# Patient Record
Sex: Female | Born: 1950 | Race: White | Hispanic: No | Marital: Single | Smoking: Current some day smoker
Health system: Southern US, Community
[De-identification: ages and names within clinical notes are randomized; demographics above are authoritative.]

## PROBLEM LIST (undated history)

## (undated) ENCOUNTER — Emergency Department (HOSPITAL_COMMUNITY): Payer: Medicare Other | Source: Home / Self Care

## (undated) DIAGNOSIS — F32A Depression, unspecified: Secondary | ICD-10-CM

## (undated) DIAGNOSIS — F329 Major depressive disorder, single episode, unspecified: Secondary | ICD-10-CM

## (undated) DIAGNOSIS — R1310 Dysphagia, unspecified: Secondary | ICD-10-CM

## (undated) DIAGNOSIS — F259 Schizoaffective disorder, unspecified: Secondary | ICD-10-CM

## (undated) DIAGNOSIS — G4700 Insomnia, unspecified: Secondary | ICD-10-CM

## (undated) DIAGNOSIS — I729 Aneurysm of unspecified site: Secondary | ICD-10-CM

## (undated) DIAGNOSIS — H539 Unspecified visual disturbance: Secondary | ICD-10-CM

## (undated) HISTORY — PX: FOOT SURGERY: SHX648

---

## 2019-09-30 ENCOUNTER — Encounter (HOSPITAL_COMMUNITY): Payer: Self-pay

## 2019-09-30 ENCOUNTER — Emergency Department (HOSPITAL_COMMUNITY): Payer: Medicare Other

## 2019-09-30 ENCOUNTER — Emergency Department (HOSPITAL_COMMUNITY)
Admission: EM | Admit: 2019-09-30 | Discharge: 2019-09-30 | Disposition: A | Discharge status: Home / Self Care | Payer: Medicare Other | Source: Home / Self Care | Attending: Emergency Medicine | Admitting: Emergency Medicine

## 2019-09-30 ENCOUNTER — Other Ambulatory Visit: Payer: Self-pay

## 2019-09-30 ENCOUNTER — Emergency Department (HOSPITAL_COMMUNITY)
Admission: EM | Admit: 2019-09-30 | Discharge: 2019-09-30 | Disposition: A | Discharge status: Home / Self Care | Payer: Medicare Other | Attending: Emergency Medicine | Admitting: Emergency Medicine

## 2019-09-30 DIAGNOSIS — R531 Weakness: Secondary | ICD-10-CM

## 2019-09-30 DIAGNOSIS — R4789 Other speech disturbances: Secondary | ICD-10-CM | POA: Insufficient documentation

## 2019-09-30 DIAGNOSIS — Z5900 Homelessness unspecified: Secondary | ICD-10-CM

## 2019-09-30 DIAGNOSIS — H538 Other visual disturbances: Secondary | ICD-10-CM

## 2019-09-30 DIAGNOSIS — Z79899 Other long term (current) drug therapy: Secondary | ICD-10-CM | POA: Insufficient documentation

## 2019-09-30 DIAGNOSIS — I10 Essential (primary) hypertension: Secondary | ICD-10-CM

## 2019-09-30 HISTORY — DX: Schizoaffective disorder, unspecified: F25.9

## 2019-09-30 HISTORY — DX: Depression, unspecified: F32.A

## 2019-09-30 LAB — URINALYSIS, ROUTINE W REFLEX MICROSCOPIC
Bilirubin Urine: NEGATIVE
Glucose, UA: NEGATIVE mg/dL
Hgb urine dipstick: NEGATIVE
Ketones, ur: NEGATIVE mg/dL
Nitrite: NEGATIVE
Protein, ur: NEGATIVE mg/dL
Specific Gravity, Urine: 1.01 (ref 1.005–1.030)
pH: 8 (ref 5.0–8.0)

## 2019-09-30 LAB — CBC WITH DIFFERENTIAL/PLATELET
Abs Immature Granulocytes: 0.04 10*3/uL (ref 0.00–0.07)
Basophils Absolute: 0.1 10*3/uL (ref 0.0–0.1)
Basophils Relative: 1 %
Eosinophils Absolute: 0.2 10*3/uL (ref 0.0–0.5)
Eosinophils Relative: 2 %
HCT: 43.8 % (ref 36.0–46.0)
Hemoglobin: 14.3 g/dL (ref 12.0–15.0)
Immature Granulocytes: 0 %
Lymphocytes Relative: 18 %
Lymphs Abs: 1.6 10*3/uL (ref 0.7–4.0)
MCH: 31.7 pg (ref 26.0–34.0)
MCHC: 32.6 g/dL (ref 30.0–36.0)
MCV: 97.1 fL (ref 80.0–100.0)
Monocytes Absolute: 1.2 10*3/uL — ABNORMAL HIGH (ref 0.1–1.0)
Monocytes Relative: 14 %
Neutro Abs: 5.8 10*3/uL (ref 1.7–7.7)
Neutrophils Relative %: 65 %
Platelets: 405 10*3/uL — ABNORMAL HIGH (ref 150–400)
RBC: 4.51 MIL/uL (ref 3.87–5.11)
RDW: 13.3 % (ref 11.5–15.5)
WBC: 8.9 10*3/uL (ref 4.0–10.5)
nRBC: 0 % (ref 0.0–0.2)

## 2019-09-30 LAB — RAPID URINE DRUG SCREEN, HOSP PERFORMED
Amphetamines: NOT DETECTED
Barbiturates: NOT DETECTED
Benzodiazepines: NOT DETECTED
Cocaine: NOT DETECTED
Opiates: NOT DETECTED
Tetrahydrocannabinol: NOT DETECTED

## 2019-09-30 LAB — COMPREHENSIVE METABOLIC PANEL
ALT: 16 U/L (ref 0–44)
AST: 29 U/L (ref 15–41)
Albumin: 4.3 g/dL (ref 3.5–5.0)
Alkaline Phosphatase: 76 U/L (ref 38–126)
Anion gap: 11 (ref 5–15)
BUN: 12 mg/dL (ref 8–23)
CO2: 28 mmol/L (ref 22–32)
Calcium: 9.4 mg/dL (ref 8.9–10.3)
Chloride: 95 mmol/L — ABNORMAL LOW (ref 98–111)
Creatinine, Ser: 0.36 mg/dL — ABNORMAL LOW (ref 0.44–1.00)
GFR calc Af Amer: >60 mL/min (ref >60)
GFR calc non Af Amer: >60 mL/min (ref >60)
Glucose, Bld: 110 mg/dL — ABNORMAL HIGH (ref 70–99)
Potassium: 3.8 mmol/L (ref 3.5–5.1)
Sodium: 134 mmol/L — ABNORMAL LOW (ref 135–145)
Total Bilirubin: 1.2 mg/dL (ref 0.3–1.2)
Total Protein: 7.3 g/dL (ref 6.5–8.1)

## 2019-09-30 LAB — ACETAMINOPHEN LEVEL: Acetaminophen (Tylenol), Serum: <10 ug/mL — ABNORMAL LOW (ref 10–30)

## 2019-09-30 LAB — SALICYLATE LEVEL: Salicylate Lvl: <7 mg/dL — ABNORMAL LOW (ref 7.0–30.0)

## 2019-09-30 LAB — URINALYSIS, MICROSCOPIC (REFLEX)

## 2019-09-30 LAB — CBG MONITORING, ED: Glucose-Capillary: 115 mg/dL — ABNORMAL HIGH (ref 70–99)

## 2019-09-30 MED ORDER — BISACODYL 5 MG PO TBEC: 5.0000 mg | DELAYED_RELEASE_TABLET | Freq: Once | ORAL | Status: DC
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
## 2019-09-30 NOTE — ED Notes (Signed)
Pt given orange juice.

## 2019-09-30 NOTE — ED Provider Notes (Signed)
TIME SEEN: 4:54 AM  CHIEF COMPLAINT: Blurry vision, speech changes  HPI: Patient is a 68 year old female with history of schizoaffective disorder who presents to the emergency department with complaints of blurry vision and changes in her speech that started after drinking a Sprite from [employer] tonight.  Reports that she just took a cab from [place] here to [place] and the police brought her from [employer] here to the hospital.  She states she feels her blurry vision and speech changes have improved.  She describes her speech changes as slurred speech.  No headache, head injury, chest pain or shortness of breath, fevers, cough, nausea, vomiting or diarrhea.  No numbness or focal weakness.  No vision loss.  She denies any eye pain.  No double vision.  She is not on blood thinners.  Reports she has had a previous aneurysm repaired but no history of intracranial hemorrhage. States she is currently homeless.  ROS: See HPI Constitutional: no fever  Eyes: no drainage  ENT: no runny nose   Cardiovascular:  no chest pain  Resp: no SOB  GI: no vomiting GU: no dysuria Integumentary: no rash  Allergy: no hives  Musculoskeletal: no leg swelling  Neurological: no slurred speech ROS otherwise negative  PAST MEDICAL HISTORY/PAST SURGICAL HISTORY:  Past Medical History:  Diagnosis Date  . Depression   . Schizoaffective disorder (HCC)     MEDICATIONS:  Prior to Admission medications   Not on File    ALLERGIES:  Allergies  Allergen Reactions  . Penicillins     SOCIAL HISTORY:  Social History   Tobacco Use  . Smoking status: Not on file  Substance Use Topics  . Alcohol use: Not on file    FAMILY HISTORY: No family history on file.  EXAM: BP (!) 169/110   Pulse 82   Temp 97.7 F (36.5 C)   Resp 18   SpO2 97%  CONSTITUTIONAL: Alert and oriented and responds appropriately to questions. 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, difficult to test visual fields due to patient's poor cooperation, no hyphema or hypopyon, no chemosis, no signs of conjunctivitis, no tearing or drainage, no sign of foreign body ENT: normal nose; moist mucous membranes,  NECK: Supple, normal ROM CARD: RRR; S1 and S2 appreciated; no murmurs, no clicks, no rubs, no gallops RESP: Normal chest excursion without splinting or tachypnea; breath sounds clear and equal bilaterally; no wheezes, no rhonchi, no rales, no hypoxia or respiratory distress, speaking full sentences ABD/GI: Normal bowel sounds; non-distended; soft, non-tender, no rebound, no guarding, no peritoneal signs, no hepatosplenomegaly BACK:  The back appears normal EXT: Normal ROM in all joints; no deformity noted, no edema; no cyanosis SKIN: Normal color for age and race; warm; no rash on exposed skin NEURO: Moves all extremities equally, sensation to light touch intact diffusely, cranial nerves II through XII intact other than diminished extraocular movements in the right eye which is chronic, speech is normal without aphasia or dysarthria, normal gait PSYCH: Bizarre affect but no SI, HI or hallucinations.  Does not appear to be responding to internal stimuli.  MEDICAL DECISION MAKING: Patient here with complaints of blurry vision and speech changes after drinking a Sprite from [employer] tonight.  I suspect that there is some psychogenic component today and possible malingering for secondary gain due to homelessness.  She does however have a history of ?  Intracranial aneurysm repair.  No focal neurologic deficits on my exam currently when she states her symptoms are improving.  Will obtain labs, urine and a CT of her head.  She has no eye pain, vision loss, double vision.  ED PROGRESS: Labs  reviewed/interpreted and are unremarkable.  Head CT reviewed/interpreted and shows no acute intracranial findings.  She does have metallic coil in the vicinity of the communicating right internal carotid.  No intracranial hemorrhage.  Urine pending.  If patient continues to be neurologically intact, anticipate discharge with outpatient resources.  On reevaluation, patient reports her neurologic issues are improving but she is now feeling lightheaded.  She is eating and drinking in the room without difficulty and smiling and laughing.  She does not appear to be in distress.  On the mention of discharge, patient immediately asked for social work consult.  She states she needs help with transportation, housing, PCP follow-up.  Will place social work consultation but anticipate patient can be discharged afterwards.   At this time, I do not feel there is any life-threatening condition present. I have reviewed, interpreted and discussed all results (EKG, imaging, lab, urine as appropriate) and exam findings with patient/family. I have reviewed nursing notes and appropriate previous records.  I feel the patient is safe to be discharged home without further emergent workup and can continue workup as an outpatient as needed. Discussed usual and customary return precautions. Patient/family verbalize understanding and are comfortable with this plan.  Outpatient follow-up has been provided as needed. All questions have been answered.    EKG Interpretation  Date/Time:  Thursday September 30 2019 05:13:13 EDT Ventricular Rate:  80 PR Interval:    QRS Duration: 100 QT Interval:  385 QTC Calculation: 445 R Axis:   -40 Text Interpretation: Sinus rhythm Atrial premature complex Probable left atrial enlargement Left anterior fascicular block Left ventricular hypertrophy Anterior Q waves, possibly due to LVH No old tracing to compare Confirmed by Breshae Belcher, Cyril Mourning [phone]) on 09/30/2019 5:50:18 AM         Angela Medina  was evaluated in Emergency Department on 09/30/2019 for the symptoms described in the history of present illness. She was evaluated in the context of the global COVID-19 pandemic, which necessitated consideration that the patient might be at risk for infection with the SARS-CoV-2 virus that causes COVID-19. Institutional protocols and algorithms that pertain to the evaluation of patients at risk for COVID-19 are in a state of rapid change based on information released by regulatory bodies including the CDC and federal and state organizations. These policies and algorithms were followed during the patient's care in the ED.      Chardonay Scritchfield, Delice Bison, DO 09/30/19 [phone]
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
## 2019-09-30 NOTE — ED Provider Notes (Signed)
[facility] EMERGENCY DEPARTMENT Provider Note   CSN: [id number] Arrival date & time: 09/30/19  1152     History Chief Complaint  Patient presents with  . recheck weakness    Angela Medina is a 69 y.o. female.  HPI   This patient is a 69 year old female who presents to the hospital approximately 5 hours after last being seen at another hospital within the system, evidently the patient had recently traveled from [place] to here, she has schizoaffective disorder, she thinks that she drank something in her Sprite that made her feel weird, she denies suicidal thoughts or active hallucinations but feels anxious like she needs to have someone help her, she is unable to tell me if she has a place to stay, if she has family in the area, she does have some flights of ideas, however she wants to have her blood rechecked to make sure that she is not having anything wrong with her blood.  She denies having any specific complaints other than generalized weakness and a poor appetite.  I have reviewed the medical record from earlier in the day, the patient had been seen for this mild generalized weakness and her labs were unremarkable at that time including metabolic panel, CBC and a urinalysis.  The patient denies chest pain, she is concerned because she has a lump in her epigastrium.  Past Medical History:  Diagnosis Date  . Depression   . Schizoaffective disorder (HCC)     There are no problems to display for this patient.   History reviewed. No pertinent surgical history.   OB History   No obstetric history on file.     No family history on file.  Social History   Tobacco Use  . Smoking status: Not on file  Substance Use Topics  . Alcohol use: Not on file  . Drug use: Not on file    Home Medications Prior to Admission medications   Not on File    Allergies    Penicillins  Review of Systems   Review of Systems  All other systems reviewed and are  negative.   Physical Exam Updated Vital Signs BP (!) 184/106 (BP Location: Left Arm)   Pulse 100   Temp 98.3 F (36.8 C) (Oral)   Resp 20   SpO2 94%   Physical Exam Vitals and nursing note reviewed.  Constitutional:      General: She is not in acute distress.    Appearance: She is well-developed.  HENT:     Head: Normocephalic and atraumatic.     Mouth/Throat:     Pharynx: No oropharyngeal exudate.  Eyes:     General: No scleral icterus.       Right eye: No discharge.        Left eye: No discharge.     Conjunctiva/sclera: Conjunctivae normal.     Pupils: Pupils are equal, round, and reactive to light.  Neck:     Thyroid: No thyromegaly.     Vascular: No JVD.  Cardiovascular:     Rate and Rhythm: Normal rate and regular rhythm.     Heart sounds: Normal heart sounds. No murmur heard.  No friction rub. No gallop.   Pulmonary:     Effort: Pulmonary effort is normal. No respiratory distress.     Breath sounds: Normal breath sounds. No wheezing or rales.  Abdominal:     General: Bowel sounds are normal. There is no distension.     Palpations: Abdomen is  soft. There is no mass.     Tenderness: There is no abdominal tenderness.     Comments: Prominent xiphoid process, this is the lump the patient is referring to, no other abdominal pain or tenderness  Musculoskeletal:        General: No tenderness. Normal range of motion.     Cervical back: Normal range of motion and neck supple.  Lymphadenopathy:     Cervical: No cervical adenopathy.  Skin:    General: Skin is warm and dry.     Findings: No erythema or rash.  Neurological:     Mental Status: She is alert.     Coordination: Coordination normal.  Psychiatric:        Behavior: Behavior normal.     Comments: This patient has a odd affect, she maintains good eye contact, is able to speak in full sentences, she does have slight pressured speech which is somewhat circuitous however she is able to carry on a conversation and  does not appear to be responding to internal stimuli and denies suicidal ideations     ED Results / Procedures / Treatments   Labs (all labs ordered are listed, but only abnormal results are displayed) Labs Reviewed - No data to display  EKG EKG Interpretation  Date/Time:  Thursday September 30 2019 12:55:59 EDT Ventricular Rate:  74 PR Interval:  172 QRS Duration: 94 QT Interval:  396 QTC Calculation: 439 R Axis:   -34 Text Interpretation: Sinus rhythm with marked sinus arrhythmia Left axis deviation Incomplete right bundle branch block Moderate voltage criteria for LVH, may be normal variant ( R in aVL , Cornell product ) Abnormal ECG since last tracing no significant change Confirmed by Eber Hong ([id number]) on 09/30/2019 1:05:48 PM   Radiology CT Head Wo Contrast  Result Date: 09/30/2019 CLINICAL DATA:  Blurry vision and speech changes which are now improving, history of intracranial aneurysm EXAM: CT HEAD WITHOUT CONTRAST TECHNIQUE: Contiguous axial images were obtained from the base of the skull through the vertex without intravenous contrast. COMPARISON:  None. FINDINGS: Brain: No evidence of acute infarction, hemorrhage, hydrocephalus, extra-axial collection or mass lesion/mass effect. Symmetric prominence of the ventricles, cisterns and sulci compatible with frontal predominant parenchymal volume loss. Patchy areas of white matter hypoattenuation are most compatible with chronic microvascular angiopathy. Vascular: Metallic coil pack with extensive streak artifact centered in the vicinity of the communicating right internal carotid. Atherosclerotic calcification of the carotid siphons. No hyperdense vessel. Skull: No calvarial fracture or suspicious osseous lesion. No scalp swelling or hematoma. Sinuses/Orbits: Paranasal sinuses and mastoid air cells are predominantly clear. Included orbital structures are unremarkable. Other: None IMPRESSION: 1. No acute intracranial findings. 2. Metallic  coil pack with extensive streak artifact centered in the vicinity of the communicating right internal carotid. 3. 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 Electronically Signed   By: Kreg Shropshire M.D.   On: 09/30/2019 06:32    Procedures Procedures (including critical care time)  Medications Ordered in ED Medications - No data to display  ED Course  I have reviewed the triage vital signs and the nursing notes.  Pertinent labs & imaging results that were available during my care of the patient were reviewed by me and considered in my medical decision making (see chart for details).    MDM Rules/Calculators/A&P                          CT scan  of the brain was performed earlier in the day as was the lab work which was all unremarkable and reassuring.  At this time the patient would likely benefit from an EKG to make sure that her weakness is not related to her heart however she appears well, she is mildly hypertensive, able to tolerate oral food and fluids.  Will be given local resources and transition of care team will be consulted to see what they can offer this patient  EKG is unremarkable and unchanged from prior showing some left axis deviation with some left ventricular hypertrophy, no other acute findings, no ischemia and no significant arrhythmia.  I have consulted with the transition of care team, they will see the patient to see if there is anything they can do to assist  This patient was able to ambulate out of the emergency department, she was given instructions on follow-up and a follow-up list with phone numbers adequate for both medical and psychiatric follow-up.  Final Clinical Impression(s) / ED Diagnoses Final diagnoses:  Generalized weakness  Essential hypertension    Rx / DC Orders ED Discharge Orders    None       Noemi Chapel, MD 10/01/19 [phone]

## 2019-09-30 NOTE — Discharge Instructions (Addendum)
Your labs, urine, head CT showed no acute abnormalities today.  Steps to find a Primary Care Provider (PCP):  Call [phone] or [phone] to access "[facility] Find a Doctor Service."  2.  You may also go on the [facility] website at [email or web address]  3.  [facility] also frequently accepts new patients.  [facility]  [address] [place] [address] [phone]  4.  There are also multiple [facility], [facility] and [facility] practices throughout [place] that are frequently accepting new patients. You may find a clinic that is close to your home and contact them.  [facility] [email or web address] [phone]  [facility] [email or web address]  [facility] [email or web address] [phone]  [facility] [email or web address] [phone]  5.  Local Health Departments also can provide primary care services.  [facility]  [address] [place] [address] [phone]  [facility] Department [address] [place] [address] [phone]  [facility] [address] [place] [address]  [place] [address] [phone]

## 2019-09-30 NOTE — Discharge Instructions (Signed)
Substance Abuse Treatment Programs ° °Intensive Outpatient Programs °[facility]     °[address]      °[place], [place]                   °[phone]      ° °[facility] °[address] °[place], [place] °[phone] ° °[facility]     °(Inpatient and outpatient)     °[address]           °[phone]   ° °[facility] °336-288-1484 (Suboxone and Methadone) ° °[address]      °[place], [place] [address]      °336-882-2125      ° °[address] °[place], [place] °852-3033 ° °[facility] (Outpatient/Inpatient, Chemical)    °(insurance only) [phone]      °       °[facility] (Groups & Residential) °[place], [place] °336-389-1413 ° °   °[facility]     °[address]     °[place], [place]      °[phone]      ° °[facility] (for caregivers and family) °[address] °[place], [place] °336-299-4655 ° ° ° ° ° °Residential Treatment Programs °[facility]      °[address], [place], [place] [address]  °([phone]      ° °[facility] °[address] [place], [place] [address] °[phone] ° °[facility]        °336-248-8914      ° °[facility] °1-800-659-3381 ° °[facility]             °[address]                                         °[place], [place]                                                °[phone] or 336-784-9470                              ° °[facility] [place] °[address] °[place], [address] °1.877.941.8954 ° °[facility]    °[address]      °[place], [place]     °[phone]      ° °[facility] °[address] °, [place] °336-285-9073 ° °[facility]   °[address]     °[place], [place] [address]     °[phone]      °Admissions: 8am-3pm M-F ° °[facility] °[address] °[place],  [place] °336-227-7417 ° °[facility]: Residential Program (90 Days)   °[place], [place]      °336-725-8389 or 800-758-6077    ° °[facility] °[place],  °(Walk in Hours over the weekend or by referral) ° °[facility] °[address], [place],  [address] °(336) [phone] ° °Crisis Mobile: [facility]:  1-877-626-1772 (for crisis response 24 hours a day) °[facility] Hotline:      1-800-256-2452 °Outpatient Psychiatry and Counseling ° °[facility]: Mobile Crisis   Management 24 hours:  1-877-626-1772 ° °[facility] sliding scale fee and walk in schedule: M-F 8am-12pm/1pm-3pm °[address]  °[place], [place] [address] °336-387-6161 ° °[facility] °[address] °[place], [place] [address] °336-703-9650 ° °[facility] ([facility])- new patient walk-in appointments available Monday - Friday 8am -3pm.          °[address] °[place], [place] [address] °336-676-6840 or crisis line- 336-676-6905 ° °[facility]/ Intensive Outpatient Therapy Program °[address] °[place], [place] [address] °336-832-9804 ° °[facility]                  °Crisis Services      °336.641.4993      °[address]     °[place], [place] [address]                ° °[facility]   °[facility] °[phone] °[address] °[place], [place] [address] ° ° °[facility]          °[address]  °[place], [place] [address]       °([phone] ° °[facility] °[address] °[place], [place] [address] °336-292-1510 ° °[facility]    °[address]    °[place], [place] [address]     °[phone]      ° °Parish McKinney, MD     °[address]     °[place] [address]     °336-282-1251     °  °[facility] °[address] °[place] [address] ° °[facility]     °[address]     °[place], [place]      °336-542-2076      ° °[facility] °Shamsher Ahluwalia, MD °[address] °[place], [place] [address] °336-420-9558 ° °[facility]     °[address]     °[place], [place] [address]     °336-274-1237      ° °[facility] °[address] °[place], [place] [address] °336-854-4450 °Resources for Temporary Residential Assistance/Crisis Centers ° °DAY CENTERS °[facility] °M-F 8am-3pm   °[address] [place], [place] [address]   336-332-0824 °Services include: laundry, barbering, support groups, case management, phone  & computer access, showers, AA/NA mtgs, mental health/substance abuse nurse, job skills class, disability information, VA assistance, spiritual classes, etc.  ° °HOMELESS SHELTERS ° °[facility]     °[facility]   °[address], [place]     °336.271.5959       °       °[facility] (women and children)       °[address] °, [place] [address] °336-275-0820 °[email or web address] for application and process °Application Required ° °[facility]   °[address]    °[place] [address]     °[phone]       °             °[facility] °[address] °, [place] [address] °336.273.5572 °336-235-0363(schedule application appt.) °Application Required ° °[facility] (women only)    °[address]     °[place],  [address]     °336-884-1039      °  Intake starts 6pm daily °Need valid ID, SSC, & Police report °[facility] [place] °[address] °[place], [place] °336-881-5420 °Application Required ° °[facility] (men only)     °[address]      °[place], [place]     °[phone]      ° °[facility] °(Pregnant women only) °[address] °[place], [place] °336-275-0206 ° °[facility]      °[address]      °[place], [place] [address]     °336-722-9951      °       °[facility] °[address] °[place], [place] °336-723-1848 °90 day commitment/SA/Application process ° °[facility](men only)     °[address]     °[place], [place]     °[phone]       °Check-in at 7pm     °       °[facility] °[address] °[place], [place] [address] °336-248-6684 °Men/Women/Women and Children must be there by 7 pm ° °[facility] °[place], [place] °336-722-8721                ° °

## 2019-09-30 NOTE — ED Notes (Signed)
Pt given turkey sandwich

## 2019-09-30 NOTE — Progress Notes (Addendum)
TOC CM received referral homelessness, PCP, and affording meds. Attempted to call pt but no phone number listed. Message sent to EDP provider. Isidoro Donning RN CCM, [facility] ED TOC CM [phone]

## 2019-09-30 NOTE — Progress Notes (Signed)
CSW and [place], RN CM met with patient at bedside to discuss her medication needs. Patient reports only needing Miralax for constipation due to significant stress. Patient reports she is homeless but was living in a hotel up until recently when she ran out of money. Patient was given homeless resources this morning upon discharge from [facility] ED - resources were in the patient's possession upon [facility] staff arrival. Patient reports not having an outpatient therapist or psychiatrist.  Patient had no additional issues to address at this time - CSW signing off as intervention is no longer needed.  Madilyn Fireman, MSW, LCSW-A Transitions of Care  Clinical Social Worker  [facility] Emergency Departments  Medical ICU [phone]
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
## 2019-11-12 ENCOUNTER — Encounter (HOSPITAL_COMMUNITY): Payer: Self-pay | Admitting: Emergency Medicine

## 2019-11-12 ENCOUNTER — Other Ambulatory Visit: Payer: Self-pay

## 2019-11-12 ENCOUNTER — Ambulatory Visit (HOSPITAL_COMMUNITY)
Admission: EM | Admit: 2019-11-12 | Discharge: 2019-11-12 | Disposition: A | Discharge status: Home / Self Care | Payer: Medicare Other | Attending: Family Medicine | Admitting: Family Medicine

## 2019-11-12 DIAGNOSIS — R21 Rash and other nonspecific skin eruption: Secondary | ICD-10-CM

## 2019-11-12 HISTORY — DX: Aneurysm of unspecified site: I72.9

## 2019-11-12 HISTORY — DX: Unspecified visual disturbance: H53.9

## 2019-11-12 MED ORDER — DOXYCYCLINE HYCLATE 100 MG PO CAPS
100.0000 mg | ORAL_CAPSULE | Freq: Two times a day (BID) | ORAL | 0 refills | Status: AC
Start: 2019-11-12 — End: 2019-11-19

## 2019-11-12 MED FILL — DOXYCYCLINE HYCLATE 100 MG: 100 | 7 days supply | Qty: 14 | Fill #0

## 2019-11-12 NOTE — ED Provider Notes (Signed)
[facility]    CSN: [id number] Arrival date & time: 11/12/19  4098      History   Chief Complaint Chief Complaint  Patient presents with  . Rash    HPI Angela Medina is a 69 y.o. female.   Patient is a 69 year old female presents today with a rash.  This began about a week and half ago.  The rash is located to various spots on her body to include arms, legs, buttocks, fingers and hands.  Reports the rash is itchy at times.  Denies any fever, joint pain. Denies any recent changes in lotions, detergents, foods or other possible irritants. No recent travel. Nobody else at home has the rash. Patient has been outside but denies any contact with plants or insects. No new foods or medications.  Patient currently homeless.  Recently stayed in a hotel.  ROS per HPI      Past Medical History:  Diagnosis Date  . Aneurysm (HCC)   . Depression   . Schizoaffective disorder (HCC)   . Vision changes    right eye    There are no problems to display for this patient.   Past Surgical History:  Procedure Laterality Date  . FOOT SURGERY      OB History   No obstetric history on file.      Home Medications    Prior to Admission medications   Medication Sig Start Date End Date Taking? Authorizing Provider  doxycycline (VIBRAMYCIN) 100 MG capsule Take 1 capsule (100 mg total) by mouth 2 (two) times daily for 7 days. 11/12/19 11/19/19  Janace Aris, NP    Family History History reviewed. No pertinent family history.  Social History Social History   Tobacco Use  . Smoking status: Current Some Day Smoker    Types: Cigarettes  . Smokeless tobacco: Never Used  Vaping Use  . Vaping Use: Never used  Substance Use Topics  . 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 Drug use: Not on file     Allergies   Penicillins   Review of Systems Review of Systems   Physical Exam Triage Vital Signs ED Triage Vitals  Enc Vitals Group     BP  11/12/19 1021 (!) 131/87     Pulse Rate 11/12/19 1021 98     Resp 11/12/19 1021 14     Temp 11/12/19 1021 98.3 F (36.8 C)     Temp Source 11/12/19 1021 Oral     SpO2 11/12/19 1021 98 %     Weight --      Height --      Head Circumference --      Peak Flow --      Pain Score 11/12/19 1014 0     Pain Loc --      Pain Edu? --      Excl. in GC? --    No data found.  Updated Vital Signs BP (!) 131/87 (BP Location: Left Arm)   Pulse 98   Temp 98.3 F (36.8 C) (Oral)   Resp 14   SpO2 98%   Visual Acuity Right Eye Distance:   Left Eye Distance:   Bilateral Distance:    Right Eye Near:   Left Eye Near:    Bilateral Near:     Physical Exam Vitals and nursing note reviewed.  Constitutional:      General: She is not in acute distress.    Appearance: Normal  appearance. She is not ill-appearing, toxic-appearing or diaphoretic.  HENT:     Head: Normocephalic.     Nose: Nose normal.  Eyes:     Conjunctiva/sclera: Conjunctivae normal.  Pulmonary:     Effort: Pulmonary effort is normal.  Musculoskeletal:        General: Normal range of motion.     Cervical back: Normal range of motion.  Skin:    General: Skin is warm and dry.     Findings: Rash present.     Comments: Scattered pustules throughout body some wounds to buttocks area that appear to be scabbed over.  A few to right arm, right lower extremity, right hand  Neurological:     Mental Status: She is alert.  Psychiatric:        Mood and Affect: Mood normal.      UC Treatments / Results  Labs (all labs ordered are listed, but only abnormal results are displayed) Labs Reviewed - No data to display  EKG   Radiology No results found.  Procedures Procedures (including critical care time)  Medications Ordered in UC Medications - No data to display  Initial Impression / Assessment and Plan / UC Course  I have reviewed the triage vital signs and the nursing notes.  Pertinent labs & imaging results that  were available during my care of the patient were reviewed by me and considered in my medical decision making (see chart for details).     Rash Covering for infection with doxycycline based on the pustular rash No other concerning signs or symptoms Follow up as needed for continued or worsening symptoms   Final Clinical Impressions(s) / UC Diagnoses   Final diagnoses:  Rash and nonspecific skin eruption     Discharge Instructions     Take the antibiotic as prescribed. You can use Benadryl or Zyrtec for itching as needed. Follow up as needed for continued or worsening symptoms     ED Prescriptions    Medication Sig Dispense Auth. Provider   doxycycline (VIBRAMYCIN) 100 MG capsule Take 1 capsule (100 mg total) by mouth 2 (two) times daily for 7 days. 14 capsule Vuk Skillern A, NP     PDMP not reviewed this encounter.   Janace Aris, NP 11/12/19 1258

## 2019-11-12 NOTE — Discharge Instructions (Addendum)
Take the antibiotic as prescribed. You can use Benadryl or Zyrtec for itching as needed. Follow up as needed for continued or worsening symptoms

## 2019-11-12 NOTE — ED Triage Notes (Signed)
Pt c/o rash all over, she states it began on her ankle and now is on both arms and it on her buttocks x 1.5 weeks. Pt states the bumps on her buttocks are painful and seem to be spreading.

## 2020-05-20 ENCOUNTER — Encounter (HOSPITAL_COMMUNITY): Payer: Self-pay | Admitting: Emergency Medicine

## 2020-05-20 ENCOUNTER — Other Ambulatory Visit: Payer: Self-pay

## 2020-05-20 ENCOUNTER — Emergency Department (HOSPITAL_COMMUNITY)
Admission: EM | Admit: 2020-05-20 | Discharge: 2020-05-20 | Disposition: A | Discharge status: Home / Self Care | Payer: Medicare Other | Attending: Emergency Medicine | Admitting: Emergency Medicine

## 2020-05-20 DIAGNOSIS — J019 Acute sinusitis, unspecified: Secondary | ICD-10-CM | POA: Insufficient documentation

## 2020-05-20 DIAGNOSIS — F17210 Nicotine dependence, cigarettes, uncomplicated: Secondary | ICD-10-CM | POA: Insufficient documentation

## 2020-05-20 DIAGNOSIS — R0981 Nasal congestion: Secondary | ICD-10-CM | POA: Diagnosis present

## 2020-05-20 MED ORDER — FLUTICASONE PROPIONATE 50 MCG/ACT NA SUSP
1.0000 | Freq: Every day | NASAL | Status: DC
  Filled 2020-05-20: qty 16

## 2020-05-20 MED ORDER — AZITHROMYCIN 250 MG PO TABS
ORAL_TABLET | ORAL | 0 refills | Status: DC
Start: 2020-05-20 — End: 2021-04-01

## 2020-05-20 NOTE — Discharge Instructions (Addendum)
Use the nasal spray to help with the persistent nasal drainage.  Take the abx as prescribed.
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
## 2020-05-20 NOTE — ED Notes (Signed)
MD at bedside. 

## 2020-05-20 NOTE — ED Notes (Signed)
Pt refused medication and was arguing with this RN. Pt reports she wants to see another doctor and will not accept the medication or the discharge papers. Attempted to go over discharge papers with pt but she continued to argue with this RN. Security at bedside as pt had already previously demonstrated argumentative and aggressive behavior towards other staff members. Pt taken in a wheelchair to wait in the lobby as it is very cold outside at this time.

## 2020-05-20 NOTE — ED Triage Notes (Signed)
Pt presents via EMS after having chronic nasal drainage and a BP check. Alert and oriented. Presents from overnight homeless shelter.

## 2021-02-28 ENCOUNTER — Encounter: Payer: Self-pay | Admitting: *Deleted
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
## 2021-03-14 ENCOUNTER — Emergency Department (HOSPITAL_COMMUNITY): Payer: Medicare Other

## 2021-03-14 ENCOUNTER — Emergency Department (HOSPITAL_COMMUNITY)
Admission: EM | Admit: 2021-03-14 | Discharge: 2021-03-15 | Disposition: A | Discharge status: Home / Self Care | Payer: Medicare Other | Attending: Emergency Medicine | Admitting: Emergency Medicine

## 2021-03-14 ENCOUNTER — Encounter (HOSPITAL_COMMUNITY): Payer: Self-pay

## 2021-03-14 DIAGNOSIS — R0602 Shortness of breath: Secondary | ICD-10-CM | POA: Diagnosis not present

## 2021-03-14 DIAGNOSIS — Z20822 Contact with and (suspected) exposure to covid-19: Secondary | ICD-10-CM | POA: Diagnosis not present

## 2021-03-14 DIAGNOSIS — F259 Schizoaffective disorder, unspecified: Secondary | ICD-10-CM | POA: Diagnosis not present

## 2021-03-14 DIAGNOSIS — R059 Cough, unspecified: Secondary | ICD-10-CM | POA: Insufficient documentation

## 2021-03-14 DIAGNOSIS — L97522 Non-pressure chronic ulcer of other part of left foot with fat layer exposed: Secondary | ICD-10-CM | POA: Diagnosis not present

## 2021-03-14 DIAGNOSIS — M79672 Pain in left foot: Secondary | ICD-10-CM | POA: Diagnosis present

## 2021-03-14 DIAGNOSIS — F17210 Nicotine dependence, cigarettes, uncomplicated: Secondary | ICD-10-CM | POA: Insufficient documentation

## 2021-03-14 LAB — CBC WITH DIFFERENTIAL/PLATELET
Abs Immature Granulocytes: 0.04 10*3/uL (ref 0.00–0.07)
Basophils Absolute: 0.1 10*3/uL (ref 0.0–0.1)
Basophils Relative: 1 %
Eosinophils Absolute: 0.2 10*3/uL (ref 0.0–0.5)
Eosinophils Relative: 3 %
HCT: 40 % (ref 36.0–46.0)
Hemoglobin: 12.5 g/dL (ref 12.0–15.0)
Immature Granulocytes: 1 %
Lymphocytes Relative: 29 %
Lymphs Abs: 2.2 10*3/uL (ref 0.7–4.0)
MCH: 31.7 pg (ref 26.0–34.0)
MCHC: 31.3 g/dL (ref 30.0–36.0)
MCV: 101.5 fL — ABNORMAL HIGH (ref 80.0–100.0)
Monocytes Absolute: 0.7 10*3/uL (ref 0.1–1.0)
Monocytes Relative: 9 %
Neutro Abs: 4.3 10*3/uL (ref 1.7–7.7)
Neutrophils Relative %: 57 %
Platelets: 339 10*3/uL (ref 150–400)
RBC: 3.94 MIL/uL (ref 3.87–5.11)
RDW: 12.6 % (ref 11.5–15.5)
WBC: 7.6 10*3/uL (ref 4.0–10.5)
nRBC: 0 % (ref 0.0–0.2)

## 2021-03-14 LAB — TROPONIN I (HIGH SENSITIVITY)
Troponin I (High Sensitivity): 14 ng/L (ref <18)
Troponin I (High Sensitivity): 17 ng/L (ref <18)

## 2021-03-14 LAB — RESP PANEL BY RT-PCR (FLU A&B, COVID) ARPGX2
Influenza A by PCR: NEGATIVE
Influenza B by PCR: NEGATIVE
SARS Coronavirus 2 by RT PCR: NEGATIVE

## 2021-03-14 LAB — BASIC METABOLIC PANEL
Anion gap: 10 (ref 5–15)
BUN: 37 mg/dL — ABNORMAL HIGH (ref 8–23)
CO2: 28 mmol/L (ref 22–32)
Calcium: 8.8 mg/dL — ABNORMAL LOW (ref 8.9–10.3)
Chloride: 101 mmol/L (ref 98–111)
Creatinine, Ser: 0.78 mg/dL (ref 0.44–1.00)
GFR, Estimated: >60 mL/min (ref >60)
Glucose, Bld: 142 mg/dL — ABNORMAL HIGH (ref 70–99)
Potassium: 4.1 mmol/L (ref 3.5–5.1)
Sodium: 139 mmol/L (ref 135–145)

## 2021-03-14 LAB — BRAIN NATRIURETIC PEPTIDE: B Natriuretic Peptide: 150.2 pg/mL — ABNORMAL HIGH (ref 0.0–100.0)

## 2021-03-14 NOTE — ED Provider Notes (Signed)
Emergency Medicine Provider Triage Evaluation Note  Angela Medina , a 70 y.o. female  was evaluated in triage.  Pt w mult complaints  C/o toe pain, wound, swelling, redness.  Further c/o sob, cough, orthopnea  Review of Systems  Positive:  toe pain, wound, swelling, redness, sob, cough, orthopnea Negative: vomiting  Physical Exam  BP (!) 134/47 (BP Location: Right Arm)   Pulse 93   Temp 98.9 F (37.2 C)   Resp 18   SpO2 94%  Gen:   Awake, no distress   Resp:  Normal effort  MSK:   Moves extremities without difficulty  Other:  Right 4th toe has wound with surrounding erythema, warmth, induration. DP pulse intact  Medical Decision Making  Medically screening exam initiated at 7:21 PM.  Appropriate orders placed.  Angela Medina was informed that the remainder of the evaluation will be completed by another provider, this initial triage assessment does not replace that evaluation, and the importance of remaining in the ED until their evaluation is complete.     Rayne Du 03/14/21 Norman Clay, MD 03/14/21 2000

## 2021-03-14 NOTE — ED Triage Notes (Signed)
Pain comes via [facility] from grocery store, c/o of toe pain that has been going on for a month as a well as a cough

## 2021-03-15 LAB — SEDIMENTATION RATE: Sed Rate: 11 mm/hr (ref 0–22)

## 2021-03-15 MED ORDER — DOXYCYCLINE HYCLATE 100 MG PO CAPS: 100.0000 mg | ORAL_CAPSULE | Freq: Two times a day (BID) | ORAL | 0 refills | Status: DC

## 2021-03-15 NOTE — ED Provider Notes (Signed)
[facility] EMERGENCY DEPARTMENT Provider Note   CSN: [id number] Arrival date & time: 03/14/21  1905     History Chief Complaint  Patient presents with   Toe Pain    Angela Medina is a 70 y.o. female.  The history is provided by the patient.  Toe Pain She has history of schizoaffective disorder and comes in with multiple complaints.  She states she is very sick.  She has had cough productive of brown sputum and brown rhinorrhea for many months.  She denies fever, chills, sweats.  She does complain of some shortness of breath.  She denies nausea, vomiting, diarrhea.  She has a sore on her right foot which has been present for about 2 weeks and is painful.  She cannot put a number on the pain.  There has been no treatment at home.   Past Medical History:  Diagnosis Date   Aneurysm (HCC)    Depression    Schizoaffective disorder (HCC)    Vision changes    right eye    There are no problems to display for this patient.   Past Surgical History:  Procedure Laterality Date   FOOT SURGERY       OB History   No obstetric history on file.     No family history on file.  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 Authorizing Provider  azithromycin (ZITHROMAX Z-PAK) 250 MG tablet Take 2 tabs by mouth on day 1 then 1 tab by mouth daily on days 2-5 05/20/20   Linwood Dibbles, MD    Allergies    Penicillins  Review of Systems   Review of Systems  All other systems reviewed and are negative.  Physical Exam Updated Vital Signs BP (!) 122/57 (BP Location: Left Arm)   Pulse (!) 52   Temp 99.1 F (37.3 C)   Resp 15   SpO2 94%   Physical Exam Vitals and nursing note  reviewed.  70 year old female, resting comfortably and in no acute distress. Vital signs are significant for slightly slow heart rate. Oxygen saturation is 94%, which is normal. Head is normocephalic and atraumatic. PERRLA, EOMI. Oropharynx is clear. Neck is nontender and supple without adenopathy or JVD. Back is nontender and there is no CVA tenderness. Lungs are clear without rales, wheezes, or rhonchi. Chest is nontender. Heart has regular rate and rhythm without murmur. Abdomen is soft, flat, nontender without masses or hepatosplenomegaly and peristalsis is normoactive. Extremities: There is an ulcer of the dorsum of the right third toe with surrounding erythema and soft tissue swelling.  Erythema extends into the distal midfoot.  Marked hallux valgus deformity noted. Skin is warm and dry without rash. Neurologic: Mental status is normal, cranial nerves are intact, moves all extremities equally.  ED Results / Procedures / Treatments   Labs (all labs ordered are listed, but only abnormal results are displayed) Labs Reviewed  CBC WITH DIFFERENTIAL/PLATELET - Abnormal; Notable for the following components:      Result Value   MCV 101.5 (*)    All other components within normal limits  BASIC METABOLIC PANEL - Abnormal; Notable for  the following components:   Glucose, Bld 142 (*)    BUN 37 (*)    Calcium 8.8 (*)    All other components within normal limits  BRAIN NATRIURETIC PEPTIDE - Abnormal; Notable for the following components:   B Natriuretic Peptide 150.2 (*)    All other components within normal limits  RESP PANEL BY RT-PCR (FLU A&B, COVID) ARPGX2  SEDIMENTATION RATE  TROPONIN I (HIGH SENSITIVITY)  TROPONIN I (HIGH SENSITIVITY)    EKG EKG Interpretation  Date/Time:  Wednesday March 14 2021 19:26:02 EST Ventricular Rate:  73 PR Interval:  154 QRS Duration: 94 QT Interval:  376 QTC Calculation: 414 R Axis:   -59 Text Interpretation: Normal sinus rhythm Left axis  deviation Left ventricular hypertrophy with repolarization abnormality ( R in aVL , Cornell product , Romhilt-Estes ) Abnormal ECG When compared with ECG of 09/30/2019, Premature atrial complexes are no longer present Confirmed by Dione Booze ([id number]) on 03/15/2021 3:17:33 AM  Radiology DG Chest 2 View  Result Date: 03/14/2021 CLINICAL DATA:  Chest pain cough EXAM: CHEST - 2 VIEW COMPARISON:  None. FINDINGS: The heart size and mediastinal contours are within normal limits. Both lungs are clear. Kyphosis of the midthoracic spine with moderate severe age indeterminate compression fractures IMPRESSION: No active cardiopulmonary disease. Age indeterminate moderate severe compression fractures of the midthoracic spine with kyphosis Electronically Signed   By: Jasmine Pang M.D.   On: 03/14/2021 20:53   DG Foot Complete Right  Result Date: 03/14/2021 CLINICAL DATA:  Toe pain EXAM: RIGHT FOOT COMPLETE - 3+ VIEW COMPARISON:  None. FINDINGS: Marked hallux valgus deformity at the first MTP joint with large bunion. Moderate arthritis at the first MTP joint. Fracture deformity at the mid to distal fourth proximal phalanx with some periosteal new bone formation suggesting subacute process. There may be in a more acute appearing fracture at the base of the fourth proximal phalanx on the lateral side. IMPRESSION: 1. Subacute to chronic appearing fracture deformity involving fourth proximal phalanx with possible more acute appearing fracture at the base of the proximal phalanx 2. Marked hallux valgus deformity at the first MTP joint with associated bunion formation and degenerative change Electronically Signed   By: Jasmine Pang M.D.   On: 03/14/2021 20:55    Procedures Procedures   Medications Ordered in ED Medications - No data to display  ED Course  I have reviewed the triage vital signs and the nursing notes.  Pertinent labs & imaging results that were available during my care of the patient were reviewed  by me and considered in my medical decision making (see chart for details).   MDM Rules/Calculators/A&P                         Ulcer of the right third toe with surrounding erythema and swelling.  This is at a minimum, cellulitis.  I am concerned about possible osteomyelitis.  X-rays ordered in triage do not show any signs of osteomyelitis, but there is subacute to chronic fracture of the proximal phalanx of the fourth toe.  Chest x-ray shows no acute process.  ECG shows LVH with repolarization abnormality, not significantly changed from prior.  Labs are significant for elevated BUN with normal creatinine.  WBC is normal with normal differential.  Will send for MRI to rule out osteomyelitis.  When patient was told that her labs were essentially normal, she became irate and demanded to see another doctor because she was  very sick and also demanded that the blood tests be repeated.  She calmed down when she was told a second time that she was going for an MRI scan.  Old records are reviewed, and she has no relevant past visits, although she did have similar outbursts in prior visits when told that she did not have serious pathology.  Patient initially refused to go for MRI scan, and started cursing at her treating nurse.  It eventually calm down and agreed to go for MRI scan.  Sedimentation rate is normal.  Case is signed out to Dr. Stevie Kern.  Final Clinical Impression(s) / ED Diagnoses Final diagnoses:  Skin ulcer of third toe, left, with fat layer exposed (HCC)  Schizoaffective disorder, unspecified type [facility])    Rx / DC Orders ED Discharge Orders     None        Dione Booze, MD 03/15/21 [phone]

## 2021-03-15 NOTE — ED Notes (Signed)
Pt refusing MRI saying she already had x-ray. This RN explained importance of MRI and education on the differences between the two different types of imaging. Pt states "I want to talk to the doctor I dont want advice from a whore." MD aware.

## 2021-03-15 NOTE — ED Notes (Signed)
Pt started walking out of the ED at this time. Reports she needs to smoke and will come back. Pt redirected by multiple staff at this time as pt has an IV in place. Pt started screaming and cussing staff at this time. Security notified. Pt escorted back to room.

## 2021-03-15 NOTE — ED Provider Notes (Addendum)
Sign out note  70 year old lady with toe pain.  MRI ordered to rule out osteomyelitis.  If negative, anticipate discharge on oral antibiotics and outpatient follow-up with podiatry.   7:36 AM received sign out from [place]   10:14 AM patient now refusing to have MRI completed and wants to leave.  Will have patient sign out AMA.  Will give course of oral antibiotics and recommend outpatient follow-up with podiatry.   Milagros Loll, MD 03/15/21 [phone]

## 2021-03-15 NOTE — ED Notes (Signed)
Pt refused vitals. Pt charged down hallway and began yelling refusing to return to room.

## 2021-03-15 NOTE — ED Notes (Signed)
Pt escorted out from the ED by staff. Pt wanted to leave AMA but refused to sign AMA papers. Refused VS at this time. Pt was cussing and screaming at staff while on the way out of the ED. Cleared for discharge by MD.

## 2021-03-15 NOTE — Discharge Instructions (Addendum)
You are leaving against medical advice given your MRI was not performed prior to you leaving. I am sending you home with antibiotics. Take as prescribed. Follow-up with podiatry for further evaluation. You may return to the ER at anytime if you symptoms worsen.

## 2021-03-15 NOTE — ED Notes (Signed)
Pt screaming in hallways saying she wants to go smoke outside. Pt educated on policy of leaving room and that she would have to register under a new visit. Pt stated she would wait for her MRI. Pt calm at the moment.

## 2021-03-15 NOTE — ED Notes (Signed)
Pt wants to leave AMA at this time. Refused to sign AMA form. Cleared for discharge by provider at this time with antibiotics prescriptions. Refused VS at this time.

## 2021-03-15 NOTE — ED Notes (Signed)
MD spoke to pt and evaluated pt if pt is fit for MRI. Pt is agreeable to get MRI and was calm talking to MD. Will continue to monitor.

## 2021-03-15 NOTE — ED Notes (Signed)
Pt is very labile and becomes agitated quickly when talking to staff. Pt then starts cussing staff and makes angry gestures. Pt redirected by staff multiple times. Pt then suddenly becomes calm after almost attacking staff. MD notified regarding this behavior. Pt has a history of schizoaffective do. Pending MRI dispo at this time.

## 2021-03-15 NOTE — ED Notes (Signed)
Pt came out of her room screaming and started pointing at staff. Pt was constantly cussing and screaming at this time while walking towards staff. Pt then swung at staff and started walking back to her room with MD escorting and talking to her. Security notified at this time for safety.

## 2021-03-15 NOTE — ED Notes (Signed)
Pt observed talking to herself constantly. Continues to scream in the room while talking to herself. Pt then often comes out of her room talking to staff and starts cussing staff. Will continue to monitor.

## 2021-03-15 NOTE — ED Notes (Signed)
MRI called at this time regarding pt status going to MRI. Per MRI, possibly after 10-11AM. No exact ETA at this time.

## 2021-03-31 ENCOUNTER — Emergency Department (HOSPITAL_COMMUNITY)
Admission: EM | Admit: 2021-03-31 | Discharge: 2021-03-31 | Disposition: A | Discharge status: Home / Self Care | Payer: Medicare Other | Source: Home / Self Care

## 2021-03-31 ENCOUNTER — Encounter (HOSPITAL_COMMUNITY): Payer: Self-pay | Admitting: Emergency Medicine

## 2021-03-31 ENCOUNTER — Emergency Department (HOSPITAL_COMMUNITY): Payer: Medicare Other

## 2021-03-31 ENCOUNTER — Other Ambulatory Visit: Payer: Self-pay

## 2021-03-31 ENCOUNTER — Inpatient Hospital Stay (HOSPITAL_COMMUNITY)
Admission: EM | Admit: 2021-03-31 | Discharge: 2021-04-02 | DRG: 193 | Disposition: A | Discharge status: Home / Self Care | Payer: Medicare Other | Attending: Student in an Organized Health Care Education/Training Program | Admitting: Student in an Organized Health Care Education/Training Program

## 2021-03-31 DIAGNOSIS — Z5902 Unsheltered homelessness: Secondary | ICD-10-CM

## 2021-03-31 DIAGNOSIS — W19XXXA Unspecified fall, initial encounter: Secondary | ICD-10-CM | POA: Diagnosis present

## 2021-03-31 DIAGNOSIS — J441 Chronic obstructive pulmonary disease with (acute) exacerbation: Secondary | ICD-10-CM

## 2021-03-31 DIAGNOSIS — J9601 Acute respiratory failure with hypoxia: Secondary | ICD-10-CM | POA: Diagnosis present

## 2021-03-31 DIAGNOSIS — E871 Hypo-osmolality and hyponatremia: Secondary | ICD-10-CM | POA: Diagnosis present

## 2021-03-31 DIAGNOSIS — I700 Atherosclerosis of aorta: Secondary | ICD-10-CM | POA: Diagnosis present

## 2021-03-31 DIAGNOSIS — J3489 Other specified disorders of nose and nasal sinuses: Secondary | ICD-10-CM | POA: Insufficient documentation

## 2021-03-31 DIAGNOSIS — J00 Acute nasopharyngitis [common cold]: Secondary | ICD-10-CM | POA: Insufficient documentation

## 2021-03-31 DIAGNOSIS — J180 Bronchopneumonia, unspecified organism: Secondary | ICD-10-CM | POA: Diagnosis not present

## 2021-03-31 DIAGNOSIS — Z5321 Procedure and treatment not carried out due to patient leaving prior to being seen by health care provider: Secondary | ICD-10-CM | POA: Insufficient documentation

## 2021-03-31 DIAGNOSIS — Z8616 Personal history of COVID-19: Secondary | ICD-10-CM

## 2021-03-31 DIAGNOSIS — F259 Schizoaffective disorder, unspecified: Secondary | ICD-10-CM | POA: Diagnosis present

## 2021-03-31 DIAGNOSIS — R509 Fever, unspecified: Secondary | ICD-10-CM

## 2021-03-31 DIAGNOSIS — R296 Repeated falls: Secondary | ICD-10-CM | POA: Diagnosis present

## 2021-03-31 DIAGNOSIS — J189 Pneumonia, unspecified organism: Secondary | ICD-10-CM

## 2021-03-31 DIAGNOSIS — Z5900 Homelessness unspecified: Secondary | ICD-10-CM | POA: Insufficient documentation

## 2021-03-31 DIAGNOSIS — J9600 Acute respiratory failure, unspecified whether with hypoxia or hypercapnia: Secondary | ICD-10-CM | POA: Diagnosis present

## 2021-03-31 DIAGNOSIS — R053 Chronic cough: Secondary | ICD-10-CM | POA: Insufficient documentation

## 2021-03-31 DIAGNOSIS — S2242XA Multiple fractures of ribs, left side, initial encounter for closed fracture: Secondary | ICD-10-CM | POA: Diagnosis present

## 2021-03-31 DIAGNOSIS — R739 Hyperglycemia, unspecified: Secondary | ICD-10-CM | POA: Diagnosis present

## 2021-03-31 DIAGNOSIS — F22 Delusional disorders: Secondary | ICD-10-CM | POA: Diagnosis present

## 2021-03-31 DIAGNOSIS — F17210 Nicotine dependence, cigarettes, uncomplicated: Secondary | ICD-10-CM | POA: Diagnosis present

## 2021-03-31 DIAGNOSIS — Z880 Allergy status to penicillin: Secondary | ICD-10-CM

## 2021-03-31 LAB — CBC WITH DIFFERENTIAL/PLATELET
Abs Immature Granulocytes: 0.07 10*3/uL (ref 0.00–0.07)
Basophils Absolute: 0.1 10*3/uL (ref 0.0–0.1)
Basophils Relative: 0 %
Eosinophils Absolute: 0 10*3/uL (ref 0.0–0.5)
Eosinophils Relative: 0 %
HCT: 37 % (ref 36.0–46.0)
Hemoglobin: 12 g/dL (ref 12.0–15.0)
Immature Granulocytes: 0 %
Lymphocytes Relative: 8 %
Lymphs Abs: 1.5 10*3/uL (ref 0.7–4.0)
MCH: 31.9 pg (ref 26.0–34.0)
MCHC: 32.4 g/dL (ref 30.0–36.0)
MCV: 98.4 fL (ref 80.0–100.0)
Monocytes Absolute: 2 10*3/uL — ABNORMAL HIGH (ref 0.1–1.0)
Monocytes Relative: 11 %
Neutro Abs: 14.9 10*3/uL — ABNORMAL HIGH (ref 1.7–7.7)
Neutrophils Relative %: 81 %
Platelets: 357 10*3/uL (ref 150–400)
RBC: 3.76 MIL/uL — ABNORMAL LOW (ref 3.87–5.11)
RDW: 12.8 % (ref 11.5–15.5)
WBC: 18.6 10*3/uL — ABNORMAL HIGH (ref 4.0–10.5)
nRBC: 0 % (ref 0.0–0.2)

## 2021-03-31 LAB — RESP PANEL BY RT-PCR (FLU A&B, COVID) ARPGX2
Influenza A by PCR: NEGATIVE
Influenza B by PCR: NEGATIVE
SARS Coronavirus 2 by RT PCR: NEGATIVE

## 2021-03-31 NOTE — ED Triage Notes (Addendum)
Pt to triage via [facility].  Pt is homeless and c/o being cold.  States she ate something bad- "hot dogs, fries, and sugary drinks" from a restaurant and had a "reaction"- runny nose and feeling different.  Reports chronic cough.  No arm drift.  No neuro deficits noted.

## 2021-03-31 NOTE — ED Notes (Signed)
Pt states she doesn't want to stay.  Explained triage process and pt states she feels better and wants to leave.  She started yelling at tech to call an ambulance to take her to another hospital.  Explained to pt that we cannot call an ambulance to take her somewhere else.  Refusing care and states she is leaving and to call a cab to pick her up.  Security called to triage.  Pt requested they let her go make a call to a taxi.  Pt went out with security.

## 2021-03-31 NOTE — ED Provider Notes (Signed)
Emergency Medicine Provider Triage Evaluation Note  Angela Medina , a 70 y.o. female  was evaluated in triage.  Pt complains of shortness of breath.  She states that she has had worsening shortness of breath and head is now causing her to had a hard time breathing at night.  She is also reportedly homeless. She was here earlier however became upset, was mad that we wouldn't call her an ambulance to take her to another hospital.    Review of Systems  Positive: Shortness of breath, cough Negative: Syncope  Physical Exam  BP 110/68 (BP Location: Right Arm)   Pulse 88   Temp 99.9 F (37.7 C) (Oral)   Resp 16   SpO2 93%  Gen:   Awake, no distress   Resp:  Normal effort, frequent cough MSK:   Moves extremities without difficulty  Other:  Normal speech.   Medical Decision Making  Medically screening exam initiated at 10:33 PM.  Appropriate orders placed.  Angela Medina was informed that the remainder of the evaluation will be completed by another provider, this initial triage assessment does not replace that evaluation, and the importance of remaining in the ED until their evaluation is complete.  The patient's age and complaining of shortness of breath will obtain flu/COVID swab, labs, chest x-ray, and EKG.   Cristina Gong, PA-C 03/31/21 2236    Sloan Leiter, DO 04/03/21 [phone]

## 2021-03-31 NOTE — ED Triage Notes (Signed)
Pt has complaints of congestion and nasal drainage.  Homeless and cold as well.

## 2021-04-01 ENCOUNTER — Encounter (HOSPITAL_COMMUNITY): Payer: Self-pay | Admitting: Radiology

## 2021-04-01 ENCOUNTER — Emergency Department (HOSPITAL_COMMUNITY): Payer: Medicare Other

## 2021-04-01 DIAGNOSIS — F259 Schizoaffective disorder, unspecified: Secondary | ICD-10-CM | POA: Diagnosis present

## 2021-04-01 DIAGNOSIS — F22 Delusional disorders: Secondary | ICD-10-CM | POA: Diagnosis not present

## 2021-04-01 DIAGNOSIS — S2242XA Multiple fractures of ribs, left side, initial encounter for closed fracture: Secondary | ICD-10-CM | POA: Diagnosis present

## 2021-04-01 DIAGNOSIS — W19XXXA Unspecified fall, initial encounter: Secondary | ICD-10-CM | POA: Diagnosis present

## 2021-04-01 DIAGNOSIS — Z8616 Personal history of COVID-19: Secondary | ICD-10-CM | POA: Diagnosis not present

## 2021-04-01 DIAGNOSIS — J9601 Acute respiratory failure with hypoxia: Secondary | ICD-10-CM | POA: Diagnosis present

## 2021-04-01 DIAGNOSIS — R739 Hyperglycemia, unspecified: Secondary | ICD-10-CM | POA: Diagnosis present

## 2021-04-01 DIAGNOSIS — J180 Bronchopneumonia, unspecified organism: Secondary | ICD-10-CM | POA: Diagnosis present

## 2021-04-01 DIAGNOSIS — I700 Atherosclerosis of aorta: Secondary | ICD-10-CM | POA: Diagnosis present

## 2021-04-01 DIAGNOSIS — F17210 Nicotine dependence, cigarettes, uncomplicated: Secondary | ICD-10-CM | POA: Diagnosis present

## 2021-04-01 DIAGNOSIS — J9600 Acute respiratory failure, unspecified whether with hypoxia or hypercapnia: Secondary | ICD-10-CM | POA: Diagnosis present

## 2021-04-01 DIAGNOSIS — Z880 Allergy status to penicillin: Secondary | ICD-10-CM | POA: Diagnosis not present

## 2021-04-01 DIAGNOSIS — Z5902 Unsheltered homelessness: Secondary | ICD-10-CM | POA: Diagnosis not present

## 2021-04-01 DIAGNOSIS — R296 Repeated falls: Secondary | ICD-10-CM | POA: Diagnosis present

## 2021-04-01 DIAGNOSIS — J189 Pneumonia, unspecified organism: Secondary | ICD-10-CM | POA: Diagnosis not present

## 2021-04-01 DIAGNOSIS — E871 Hypo-osmolality and hyponatremia: Secondary | ICD-10-CM | POA: Diagnosis present

## 2021-04-01 LAB — I-STAT ARTERIAL BLOOD GAS, ED
Acid-Base Excess: 4 mmol/L — ABNORMAL HIGH (ref 0.0–2.0)
Bicarbonate: 30 mmol/L — ABNORMAL HIGH (ref 20.0–28.0)
Calcium, Ion: 1.12 mmol/L — ABNORMAL LOW (ref 1.15–1.40)
HCT: 35 % — ABNORMAL LOW (ref 36.0–46.0)
Hemoglobin: 11.9 g/dL — ABNORMAL LOW (ref 12.0–15.0)
O2 Saturation: 88 %
Potassium: 3.7 mmol/L (ref 3.5–5.1)
Sodium: 132 mmol/L — ABNORMAL LOW (ref 135–145)
TCO2: 31 mmol/L (ref 22–32)
pCO2 arterial: 47.9 mmHg (ref 32.0–48.0)
pH, Arterial: 7.405 (ref 7.350–7.450)
pO2, Arterial: 54 mmHg — ABNORMAL LOW (ref 83.0–108.0)

## 2021-04-01 LAB — CBC WITH DIFFERENTIAL/PLATELET
Abs Immature Granulocytes: 0.12 10*3/uL — ABNORMAL HIGH (ref 0.00–0.07)
Basophils Absolute: 0 10*3/uL (ref 0.0–0.1)
Basophils Relative: 0 %
Eosinophils Absolute: 0 10*3/uL (ref 0.0–0.5)
Eosinophils Relative: 0 %
HCT: 36.8 % (ref 36.0–46.0)
Hemoglobin: 12.1 g/dL (ref 12.0–15.0)
Immature Granulocytes: 1 %
Lymphocytes Relative: 4 %
Lymphs Abs: 0.6 10*3/uL — ABNORMAL LOW (ref 0.7–4.0)
MCH: 32.8 pg (ref 26.0–34.0)
MCHC: 32.9 g/dL (ref 30.0–36.0)
MCV: 99.7 fL (ref 80.0–100.0)
Monocytes Absolute: 0.6 10*3/uL (ref 0.1–1.0)
Monocytes Relative: 4 %
Neutro Abs: 16.1 10*3/uL — ABNORMAL HIGH (ref 1.7–7.7)
Neutrophils Relative %: 91 %
Platelets: 346 10*3/uL (ref 150–400)
RBC: 3.69 MIL/uL — ABNORMAL LOW (ref 3.87–5.11)
RDW: 12.9 % (ref 11.5–15.5)
WBC: 17.5 10*3/uL — ABNORMAL HIGH (ref 4.0–10.5)
nRBC: 0 % (ref 0.0–0.2)

## 2021-04-01 LAB — COMPREHENSIVE METABOLIC PANEL
ALT: 14 U/L (ref 0–44)
AST: 20 U/L (ref 15–41)
Albumin: 2.9 g/dL — ABNORMAL LOW (ref 3.5–5.0)
Alkaline Phosphatase: 90 U/L (ref 38–126)
Anion gap: 10 (ref 5–15)
BUN: 12 mg/dL (ref 8–23)
CO2: 28 mmol/L (ref 22–32)
Calcium: 8.3 mg/dL — ABNORMAL LOW (ref 8.9–10.3)
Chloride: 96 mmol/L — ABNORMAL LOW (ref 98–111)
Creatinine, Ser: 0.63 mg/dL (ref 0.44–1.00)
GFR, Estimated: >60 mL/min (ref >60)
Glucose, Bld: 117 mg/dL — ABNORMAL HIGH (ref 70–99)
Potassium: 3.4 mmol/L — ABNORMAL LOW (ref 3.5–5.1)
Sodium: 134 mmol/L — ABNORMAL LOW (ref 135–145)
Total Bilirubin: 0.5 mg/dL (ref 0.3–1.2)
Total Protein: 6.4 g/dL — ABNORMAL LOW (ref 6.5–8.1)

## 2021-04-01 LAB — TROPONIN I (HIGH SENSITIVITY)
Troponin I (High Sensitivity): 24 ng/L — ABNORMAL HIGH (ref <18)
Troponin I (High Sensitivity): 44 ng/L — ABNORMAL HIGH (ref <18)
Troponin I (High Sensitivity): 19 ng/L — ABNORMAL HIGH (ref <18)
Troponin I (High Sensitivity): 20 ng/L — ABNORMAL HIGH (ref <18)

## 2021-04-01 LAB — BASIC METABOLIC PANEL
Anion gap: 10 (ref 5–15)
BUN: 11 mg/dL (ref 8–23)
CO2: 27 mmol/L (ref 22–32)
Calcium: 8.1 mg/dL — ABNORMAL LOW (ref 8.9–10.3)
Chloride: 95 mmol/L — ABNORMAL LOW (ref 98–111)
Creatinine, Ser: 0.67 mg/dL (ref 0.44–1.00)
GFR, Estimated: >60 mL/min (ref >60)
Glucose, Bld: 161 mg/dL — ABNORMAL HIGH (ref 70–99)
Potassium: 3.9 mmol/L (ref 3.5–5.1)
Sodium: 132 mmol/L — ABNORMAL LOW (ref 135–145)

## 2021-04-01 LAB — HIV ANTIBODY (ROUTINE TESTING W REFLEX): HIV Screen 4th Generation wRfx: NONREACTIVE

## 2021-04-01 LAB — D-DIMER, QUANTITATIVE: D-Dimer, Quant: 1.26 ug/mL-FEU — ABNORMAL HIGH (ref 0.00–0.50)

## 2021-04-01 LAB — BRAIN NATRIURETIC PEPTIDE: B Natriuretic Peptide: 169.3 pg/mL — ABNORMAL HIGH (ref 0.0–100.0)

## 2021-04-01 MED ORDER — SODIUM CHLORIDE 0.9 % IV SOLN
500.0000 mg | INTRAVENOUS | Status: DC
  Administered 2021-04-02: 500 mg via INTRAVENOUS
  Filled 2021-04-01: qty 5

## 2021-04-01 MED ORDER — METHYLPREDNISOLONE SODIUM SUCC 125 MG IJ SOLR
125.0000 mg | Freq: Once | INTRAMUSCULAR | Status: AC
  Administered 2021-04-01: 125 mg via INTRAVENOUS
  Filled 2021-04-01: qty 2

## 2021-04-01 MED ORDER — HALOPERIDOL LACTATE 5 MG/ML IJ SOLN
4.0000 mg | Freq: Two times a day (BID) | INTRAMUSCULAR | Status: DC
  Administered 2021-04-01: 4 mg via INTRAMUSCULAR
  Filled 2021-04-01: qty 1

## 2021-04-01 MED ORDER — IPRATROPIUM-ALBUTEROL 0.5-2.5 (3) MG/3ML IN SOLN
3.0000 mL | Freq: Once | RESPIRATORY_TRACT | Status: AC
  Administered 2021-04-01: 3 mL via RESPIRATORY_TRACT
  Filled 2021-04-01: qty 3

## 2021-04-01 MED ORDER — SODIUM CHLORIDE 0.9 % IV BOLUS
1000.0000 mL | Freq: Once | INTRAVENOUS | Status: AC
  Administered 2021-04-01: 1000 mL via INTRAVENOUS

## 2021-04-01 MED ORDER — IOHEXOL 350 MG/ML SOLN
80.0000 mL | Freq: Once | INTRAVENOUS | Status: AC | PRN
  Administered 2021-04-01: 80 mL via INTRAVENOUS

## 2021-04-01 MED ORDER — LORAZEPAM 2 MG/ML IJ SOLN: 1.0000 mg | Freq: Once | INTRAMUSCULAR | Status: AC

## 2021-04-01 MED ORDER — ENOXAPARIN SODIUM 40 MG/0.4ML IJ SOSY
40.0000 mg | PREFILLED_SYRINGE | Freq: Every day | INTRAMUSCULAR | Status: DC
  Administered 2021-04-01 – 2021-04-02 (×2): 40 mg via SUBCUTANEOUS
  Filled 2021-04-01 (×2): qty 0.4

## 2021-04-01 MED ORDER — SODIUM CHLORIDE 0.9 % IV SOLN
2.0000 g | INTRAVENOUS | Status: DC
  Administered 2021-04-02: 2 g via INTRAVENOUS
  Filled 2021-04-01: qty 20

## 2021-04-01 MED ORDER — MAGNESIUM SULFATE 2 GM/50ML IV SOLN
2.0000 g | Freq: Once | INTRAVENOUS | Status: AC
  Administered 2021-04-01: 2 g via INTRAVENOUS
  Filled 2021-04-01: qty 50

## 2021-04-01 MED ORDER — HALOPERIDOL 1 MG PO TABS
4.0000 mg | ORAL_TABLET | Freq: Two times a day (BID) | ORAL | Status: DC
  Administered 2021-04-02: 4 mg via ORAL
  Filled 2021-04-01 (×3): qty 4

## 2021-04-01 MED ORDER — LORAZEPAM 2 MG/ML IJ SOLN
INTRAMUSCULAR | Status: AC
  Administered 2021-04-01: 1 mg via INTRAVENOUS
  Filled 2021-04-01: qty 1

## 2021-04-01 MED ORDER — SODIUM CHLORIDE 0.9 % IV SOLN
500.0000 mg | Freq: Once | INTRAVENOUS | Status: AC
  Administered 2021-04-01: 500 mg via INTRAVENOUS
  Filled 2021-04-01: qty 5

## 2021-04-01 MED ORDER — SODIUM CHLORIDE 0.9 % IV SOLN
1.0000 g | Freq: Once | INTRAVENOUS | Status: AC
  Administered 2021-04-01: 1 g via INTRAVENOUS
  Filled 2021-04-01: qty 10

## 2021-04-01 MED ORDER — IBUPROFEN 800 MG PO TABS
800.0000 mg | ORAL_TABLET | Freq: Once | ORAL | Status: AC
  Administered 2021-04-01: 800 mg via ORAL
  Filled 2021-04-01: qty 1

## 2021-04-01 MED ORDER — IPRATROPIUM-ALBUTEROL 0.5-2.5 (3) MG/3ML IN SOLN
3.0000 mL | Freq: Once | RESPIRATORY_TRACT | Status: DC
  Filled 2021-04-01: qty 3

## 2021-04-01 NOTE — ED Notes (Signed)
Pt became increasingly agitated, threw her cup of water against the wall believing it was poisoned, pt has extreme paranoid delusions, unable to to be reassured, when this RN went to get labs pt started yelling and attempting to get out of bed. When pt was redirected to stay in the bed she swung at this RN. Security called, MD notified.

## 2021-04-01 NOTE — ED Provider Notes (Signed)
[facility] EMERGENCY DEPARTMENT Provider Note   CSN: [id number] Arrival date & time: 03/31/21  2112     History Chief Complaint  Patient presents with   Shortness of Breath    Angela Medina is a 70 y.o. female.  Patient with a history of homelessness, tobacco abuse presenting with shortness of breath, cough and subjective fever.  States she has been sick ever since she had COVID a 1.5 years ago.  States her breathing is progressively worsening over the past several days especially at night and she finds it difficult to catch her breath.  She is never been prescribed any medications for her breathing and does not use any inhalers.  She smokes cigarettes but not on a regular basis.  Denies any drug use.  States she feels more short of breath when she tries to lie down and more short of breath when she tries to go to sleep at night.  Has a cough productive of clear yellow mucus.  Not bloody.  Denies chest pain.  Denies abdominal pain, nausea or vomiting.  Denies any leg pain or leg swelling. States she has significant nasal congestion and dripping behind her nose.  Unknown if she is had any fevers but is febrile on arrival to the ED. She is never been hospitalized for her breathing  The history is provided by the patient.  Shortness of Breath Associated symptoms: cough and fever   Associated symptoms: no abdominal pain, no chest pain, no headaches, no rash, no sore throat and no vomiting       Past Medical History:  Diagnosis Date   Aneurysm ([place])    Depression    Schizoaffective disorder ([place])    Vision changes    right eye    There are no problems to display for this patient.   Past Surgical History:  Procedure Laterality Date   FOOT SURGERY       OB History   No obstetric history on file.     No family history on file.  Social History   Tobacco Use   Smoking status: Some Days    Types: Cigarettes   Smokeless tobacco: Never  Vaping Use    Vaping Use: Never used  Substance Use Topics   Alcohol use: Not Currently    Comment: sober for 15 years    Home Medications Prior to Admission medications   Medication Sig Start Date End Date Taking? Authorizing Provider  azithromycin (ZITHROMAX Z-PAK) 250 MG tablet Take 2 tabs by mouth on day 1 then 1 tab by mouth daily on days 2-5 Patient not taking: Reported on 04/01/2021 05/20/20   Dorie Rank, MD  doxycycline (VIBRAMYCIN) 100 MG capsule Take 1 capsule (100 mg total) by mouth 2 (two) times daily. Patient not taking: Reported on 04/01/2021 03/15/21   Suzy Bouchard, PA-C    Allergies    Penicillins  Review of Systems   Review of Systems  Constitutional:  Positive for activity change, appetite change, fatigue and fever.  HENT:  Positive for congestion and rhinorrhea. Negative for sore throat.   Respiratory:  Positive for cough, chest tightness and shortness of breath.   Cardiovascular:  Negative for chest pain.  Gastrointestinal:  Negative for abdominal pain, nausea and vomiting.  Genitourinary:  Negative for dysuria and hematuria.  Musculoskeletal:  Negative for arthralgias and myalgias.  Skin:  Negative for rash.  Neurological:  Positive for weakness. Negative for headaches.   all other systems are negative except as  noted in the HPI and PMH.   Physical Exam Updated Vital Signs BP 136/88 (BP Location: Right Arm)   Pulse 89   Temp (!) 100.4 F (38 C) (Oral)   Resp (!) 22   SpO2 96%   Physical Exam Vitals and nursing note reviewed.  Constitutional:      General: She is in acute distress.     Appearance: She is well-developed.     Comments: Speaking in short phrases, mild increased work of breathing Disheveled appearing  HENT:     Head: Normocephalic and atraumatic.     Mouth/Throat:     Pharynx: No oropharyngeal exudate.  Eyes:     Conjunctiva/sclera: Conjunctivae normal.     Pupils: Pupils are equal, round, and reactive to light.  Neck:     Comments: No  meningismus. Cardiovascular:     Rate and Rhythm: Normal rate and regular rhythm.     Heart sounds: Normal heart sounds. No murmur heard. Pulmonary:     Effort: Respiratory distress present.     Breath sounds: Normal breath sounds.     Comments: Moderately increased work of breathing, diminished breath sounds throughout Abdominal:     Palpations: Abdomen is soft.     Tenderness: There is no abdominal tenderness. There is no guarding or rebound.  Musculoskeletal:        General: No tenderness. Normal range of motion.     Cervical back: Normal range of motion and neck supple.  Skin:    General: Skin is warm.  Neurological:     General: No focal deficit present.     Mental Status: She is alert and oriented to person, place, and time. Mental status is at baseline.     Cranial Nerves: No cranial nerve deficit.     Motor: No abnormal muscle tone.     Coordination: Coordination normal.     Comments:  5/5 strength throughout. CN 2-12 intact.Equal grip strength.   Psychiatric:        Behavior: Behavior normal.    ED Results / Procedures / Treatments   Labs (all labs ordered are listed, but only abnormal results are displayed) Labs Reviewed  COMPREHENSIVE METABOLIC PANEL - Abnormal; Notable for the following components:      Result Value   Sodium 134 (*)    Potassium 3.4 (*)    Chloride 96 (*)    Glucose, Bld 117 (*)    Calcium 8.3 (*)    Total Protein 6.4 (*)    Albumin 2.9 (*)    All other components within normal limits  CBC WITH DIFFERENTIAL/PLATELET - Abnormal; Notable for the following components:   WBC 18.6 (*)    RBC 3.76 (*)    Neutro Abs 14.9 (*)    Monocytes Absolute 2.0 (*)    All other components within normal limits  TROPONIN I (HIGH SENSITIVITY) - Abnormal; Notable for the following components:   Troponin I (High Sensitivity) 24 (*)    All other components within normal limits  TROPONIN I (HIGH SENSITIVITY) - Abnormal; Notable for the following components:    Troponin I (High Sensitivity) 44 (*)    All other components within normal limits  RESP PANEL BY RT-PCR (FLU A&B, COVID) ARPGX2  D-DIMER, QUANTITATIVE  BRAIN NATRIURETIC PEPTIDE  TROPONIN I (HIGH SENSITIVITY)    EKG EKG Interpretation  Date/Time:  Sunday April 01 2021 05:41:49 EST Ventricular Rate:  76 PR Interval:  164 QRS Duration: 104 QT Interval:  369 QTC Calculation: 415  R Axis:   -25 Text Interpretation: Sinus rhythm Atrial premature complex LVH with secondary repolarization abnormality Artifact No significant change was found Confirmed by Glynn Octave [phone]) on 04/01/2021 5:45:20 AM  Radiology DG Chest 2 View  Result Date: 03/31/2021 CLINICAL DATA:  Cough, shortness of breath. EXAM: CHEST - 2 VIEW COMPARISON:  03/14/2021. FINDINGS: The heart size and mediastinal contours are stable. Mild atherosclerotic calcification of the aorta is noted. Interstitial prominence is noted in the lungs bilaterally and unchanged from the prior exam and may be chronic. No consolidation, effusion, or pneumothorax. Stable compression deformities and kyphosis are noted in the midthoracic spine. No acute osseous abnormality. IMPRESSION: Cardiomegaly with no acute cardiopulmonary process. Electronically Signed   By: Thornell Sartorius M.D.   On: 03/31/2021 23:46    Procedures Procedures   Medications Ordered in ED Medications  ipratropium-albuterol (DUONEB) 0.5-2.5 (3) MG/3ML nebulizer solution 3 mL (has no administration in time range)  magnesium sulfate IVPB 2 g 50 mL (has no administration in time range)  methylPREDNISolone sodium succinate (SOLU-MEDROL) 125 mg/2 mL injection 125 mg (has no administration in time range)  sodium chloride 0.9 % bolus 1,000 mL (has no administration in time range)    ED Course  I have reviewed the triage vital signs and the nursing notes.  Pertinent labs & imaging results that were available during my care of the patient were reviewed by me and considered in  my medical decision making (see chart for details).    MDM Rules/Calculators/A&P                          Progressively worsening shortness of breath and cough for several months.  She has not hypoxic which she is typically tachypneic with diminished breath sounds.  She be given bronchodilators and steroids. X-ray shows cardiomegaly without edema or pneumonia  Leukocytosis and minimal troponin elevation noted.  Patient significantly dyspneic with decreased breath sounds throughout.  X-ray does show cardiomegaly without significant edema.  She is given bronchodilators and steroids for presumed COPD exacerbation.  Does have a fever on arrival but COVID and flu swabs are negative.  Troponin slightly elevated and trending up.  EKG without significant ischemic changes.  Will continue to trend.  D-dimer is elevated. Given degree of dyspnea will evaluate for pulmonary embolism.  Care to be transferred at shift change.  Will need to be reevaluated and reassessed prior to any disposition.  Likely continue treatment for COPD exacerbation. Final Clinical Impression(s) / ED Diagnoses Final diagnoses:  None    Rx / DC Orders ED Discharge Orders     None        Carrah Eppolito, Jeannett Senior, MD 04/01/21 0700

## 2021-04-01 NOTE — ED Notes (Signed)
This RN went in to assess pt as pt satting at 88% on [place] while sleeping- this RN asked pt to take some deep breaths in through her nose - pt woke up and started rambling about things, jerked away from this RN. Pt saw dinner tray and seemed interested in eating - pt denied any further needs at this time

## 2021-04-01 NOTE — H&P (Signed)
NAME:  Angela Medina, MRN:  [id number], DOB:  [birth year], LOS: 0 ADMISSION DATE:  03/31/2021, Primary: Patient, No Pcp Per (Inactive)  CHIEF COMPLAINT: Shortness of breath  Medical Service: Internal Medicine Teaching Service         Attending Physician: Dr. Oswaldo Done, Marquita Palms, *    First Contact: Dr. Burnice Logan Pager: [phone]  Second Contact: Dr. Elaina Pattee Pager: [phone]       After Hours (After 5p/  First Contact Pager: [phone]  weekends / holidays): Second Contact Pager: [phone]    History of present illness   70 year old homeless female with past medical history as noted below who presented to [facility] emergency department for shortness of breath.  History taking is complicated by untreated mental health disorders.  Her primary complaints are "mucus in the back of my throat" and shortness of breath.  Is unable to ascertain when her symptoms started, notes that she had COVID-19 a year and a half ago and has felt poorly ever since. Associated symptoms include a productive cough with yellow phlegm production; denies hemoptysis.  Denies any other associated symptoms.  Denies recent sick contacts.  She notes she has smoked a pack of cigarettes a day for around 10 years.  Denies illicit substance use.  During our conversation, she tells me that someone has been poisoning her food with lead for the past couple of weeks.  During my initial interview, she did not display hallucinations or other psychotic features aside from the delusion about food being poisoned.  Shortly after our visit, I was asked to evaluate the patient, by the bedside RN, due to the development of combative and aggressive behavior.  On my reevaluation, she is extremely agitated, stating that something got put in her orange juice, that is just been given to her by nursing staff, that caused her to have terrible reaction and contaminated her blood.  She also reported seeing an assassin/contract for hire hit man that was  walking back and forth in front of her room.  She implies that she knows these men and notes that the government has hired these individuals to look for her because she is using microwave signals to keep them from knowing what she is thinking.    Past Medical History  She,  has a past medical history of Aneurysm (HCC), Depression, Schizoaffective disorder (HCC), and Vision changes.   Home Medications     No prior to admission medications  Allergies    Allergies as of 03/31/2021 - Review Complete 03/31/2021  Allergen Reaction Noted   Penicillins Other (See Comments) 09/30/2019    Social History   reports that she has been smoking cigarettes. She has never used smokeless tobacco. She reports that she does not currently use alcohol.   Family History   Her family history is not on file. Unable to be obtained through patient interview.   ROS  Unable to be obtained due to acute psychosis  Objective   Blood pressure 121/76, pulse 71, temperature (!) 100.4 F (38 C), temperature source Oral, resp. rate (!) 27, SpO2 95 %.    General: Disheveled, poorly groomed female, appears older than stated age HEENT: Head atraumatic, no rhinorrhea, no scleral icterus or conjunctival injection Cardiac: Heart regular rate and rhythm, no lower extremity edema Pulm: Breathing comfortably on 2 L supplemental oxygen, diffuse rhonchi GI: Abdomen is soft, nondistended, nontender Skin: No rash or lesion on limited exam Neuro: Alert and oriented x4 Psychiatric exam Eye Contact:  Fair  Speech:  Normal Rate  Volume:  Increased  Mood:  Irritable  Affect:  Labile  Thought Process:  circumferential   Orientation:  Full (Time, Place, and Person)  Thought Content:  Delusions, Hallucinations: Visual, and Ideas of Reference:   Paranoia Delusions  Suicidal Thoughts:  No  Homicidal Thoughts:  No  Psychomotor Activity:  Normal   Significant Diagnostic Tests:  Chest CTA:  -No PE.  Tortuous thoracic aorta.   Aortic atherosclerosis.  Mild cardiomegaly.   -Major airways are patent but there is widespread nodular and confluent peribronchial opacity in the posterior basal segment of the left lower lobe, associated with some segmental or subsegmental airway opacification there.  Generalized central bronchial wall thickening elsewhere.  Early similar patchy and nodular peribronchial opacity in the right lower lobe and lateral segment of the right middle lobe with some consolidation in the latter.  Early tree-in-bud nodular opacities in the right upper lobe.   -Thickening of the adrenal glands such as due to adrenal hyperplasia.   -T8 vertebral plana with associated chronic ankylosis with the T7 and T9 vertebral bodies.  Subsequent focal thoracic kyphosis.  Very severe upper lumbar disc, endplate, and facet degeneration.   -Chronic appearing right lateral 8 and/or ninth rib fractures.  More acute to subacute appearing anterior left 2-5 rib fractures.  Labs    CBC Latest Ref Rng & Units 04/01/2021 04/01/2021 03/31/2021  WBC 4.0 - 10.5 K/uL - 17.5(H) 18.6(H)  Hemoglobin 12.0 - 15.0 g/dL 11.9(L) 12.1 12.0  Hematocrit 36.0 - 46.0 % 35.0(L) 36.8 37.0  Platelets 150 - 400 K/uL - 346 357   BMP Latest Ref Rng & Units 04/01/2021 04/01/2021 03/31/2021  Glucose 70 - 99 mg/dL - 161(H) 117(H)  BUN 8 - 23 mg/dL - 11 12  Creatinine 0.44 - 1.00 mg/dL - 0.67 0.63  Sodium 135 - 145 mmol/L 132(L) 132(L) 134(L)  Potassium 3.5 - 5.1 mmol/L 3.7 3.9 3.4(L)  Chloride 98 - 111 mmol/L - 95(L) 96(L)  CO2 22 - 32 mmol/L - 27 28  Calcium 8.9 - 10.3 mg/dL - 8.1(L) 8.3(L)    Summary  70 year old homeless female with untreated schizoaffective disorder who presented to the emergency department with shortness of breath and was subsequently admitted to our service for ongoing management of community-acquired pneumonia and acute psychosis related to untreated schizoaffective disorder.  Assessment & Plan:  Principal Problem:    Bronchopneumonia Active Problems:   Acute respiratory failure (HCC)  Acute hypoxic respiratory failure secondary to community acquired pneumonia, rib fractures Acute PE ruled out on CTA.  Rib fractures do not seem to be causing her significant discomfort or impeding on ventilation -5-day course of azithromycin and Rocephin - Wean oxygen as able -As needed Tylenol  Untreated schitzoaffective disorder with acute psychosis Socioeconomic difficulties No prior to admission medications - Received 1 mg Ativan in the ED - Start scheduled Haldol 4 mg twice daily.  If symptoms are not well controlled on this, additional as needed Haldol may be given - Psychiatry consultation. -Transitions of care consulted for assistance with social and domestic issues.  Hyperglycemia - Check A1c in the morning  Mild hyponatremia - Monitor labs  Aortic atherosclerosis - She would benefit from the addition of statin therapy once psychotic symptoms are controlled  Tobacco use disorder - Encourage cessation  Best practice:  CODE STATUS: Full code DVT for prophylaxis: Lovenox Family communication: No family listed in the chart Dispo: Admit patient to Inpatient with expected length of stay greater than 2 midnights.  Mitzi Hansen, MD Internal Medicine Resident PGY-3 [facility] Pager: [phone] 04/01/2021 3:37 PM

## 2021-04-01 NOTE — Progress Notes (Signed)
RT attempted ABG but Pt is being combative. Security was called. RT will hold ABG at this time. MD made aware.

## 2021-04-01 NOTE — ED Notes (Signed)
MD promptly arrived at the bedside and demonstrated multiple attempts to calm the pt., pt.'s paranoia overruled and prn ativan was administered

## 2021-04-01 NOTE — ED Notes (Signed)
Pt assisted onto bedside commode and back onto bed - pt given graham crackers and milk per request

## 2021-04-01 NOTE — ED Notes (Signed)
Pt is refusing haldol at this time and requesting Ambien - MD paged and notified - per MD pt can not have Ambien as it will worsen her symptoms - pt is sleeping right now - per MD if pt starts getting agitated can try haldol again and page MD to have psych see her

## 2021-04-01 NOTE — Progress Notes (Signed)
This writer went to see patient in ED for psychiatry consult H&P, and pt is asleep and attempted to wake patient-up three times.  The psychiatry consult service will continue to follow, and see the patient for initial consult note on Monday, 04-02-2021.   Phineas Inches, MD  Psychiatrist

## 2021-04-02 ENCOUNTER — Other Ambulatory Visit (HOSPITAL_COMMUNITY): Payer: Self-pay

## 2021-04-02 ENCOUNTER — Emergency Department (HOSPITAL_COMMUNITY): Payer: Medicare Other

## 2021-04-02 ENCOUNTER — Other Ambulatory Visit: Payer: Self-pay

## 2021-04-02 ENCOUNTER — Encounter (HOSPITAL_COMMUNITY): Payer: Self-pay

## 2021-04-02 ENCOUNTER — Observation Stay (HOSPITAL_COMMUNITY): Payer: Medicare Other

## 2021-04-02 ENCOUNTER — Inpatient Hospital Stay (HOSPITAL_COMMUNITY)
Admission: EM | Admit: 2021-04-02 | Discharge: 2021-04-09 | DRG: 193 | Disposition: A | Discharge status: Home / Self Care | Payer: Medicare Other | Attending: Student in an Organized Health Care Education/Training Program | Admitting: Student in an Organized Health Care Education/Training Program

## 2021-04-02 DIAGNOSIS — Z5900 Homelessness unspecified: Secondary | ICD-10-CM

## 2021-04-02 DIAGNOSIS — Z79899 Other long term (current) drug therapy: Secondary | ICD-10-CM

## 2021-04-02 DIAGNOSIS — F259 Schizoaffective disorder, unspecified: Secondary | ICD-10-CM

## 2021-04-02 DIAGNOSIS — R778 Other specified abnormalities of plasma proteins: Secondary | ICD-10-CM | POA: Diagnosis present

## 2021-04-02 DIAGNOSIS — J180 Bronchopneumonia, unspecified organism: Secondary | ICD-10-CM | POA: Diagnosis not present

## 2021-04-02 DIAGNOSIS — Z880 Allergy status to penicillin: Secondary | ICD-10-CM

## 2021-04-02 DIAGNOSIS — R319 Hematuria, unspecified: Secondary | ICD-10-CM | POA: Diagnosis present

## 2021-04-02 DIAGNOSIS — R32 Unspecified urinary incontinence: Secondary | ICD-10-CM | POA: Diagnosis present

## 2021-04-02 DIAGNOSIS — F419 Anxiety disorder, unspecified: Secondary | ICD-10-CM | POA: Diagnosis present

## 2021-04-02 DIAGNOSIS — J9621 Acute and chronic respiratory failure with hypoxia: Secondary | ICD-10-CM

## 2021-04-02 DIAGNOSIS — F32A Depression, unspecified: Secondary | ICD-10-CM | POA: Diagnosis present

## 2021-04-02 DIAGNOSIS — R569 Unspecified convulsions: Secondary | ICD-10-CM

## 2021-04-02 DIAGNOSIS — F17210 Nicotine dependence, cigarettes, uncomplicated: Secondary | ICD-10-CM | POA: Diagnosis present

## 2021-04-02 DIAGNOSIS — F22 Delusional disorders: Secondary | ICD-10-CM | POA: Diagnosis present

## 2021-04-02 DIAGNOSIS — J9601 Acute respiratory failure with hypoxia: Secondary | ICD-10-CM | POA: Diagnosis present

## 2021-04-02 DIAGNOSIS — Z20822 Contact with and (suspected) exposure to covid-19: Secondary | ICD-10-CM | POA: Diagnosis present

## 2021-04-02 DIAGNOSIS — R0902 Hypoxemia: Secondary | ICD-10-CM

## 2021-04-02 DIAGNOSIS — J189 Pneumonia, unspecified organism: Secondary | ICD-10-CM | POA: Diagnosis present

## 2021-04-02 LAB — RAPID URINE DRUG SCREEN, HOSP PERFORMED
Amphetamines: NOT DETECTED
Barbiturates: NOT DETECTED
Benzodiazepines: NOT DETECTED
Cocaine: NOT DETECTED
Opiates: NOT DETECTED
Tetrahydrocannabinol: NOT DETECTED

## 2021-04-02 LAB — I-STAT VENOUS BLOOD GAS, ED
Acid-Base Excess: 6 mmol/L — ABNORMAL HIGH (ref 0.0–2.0)
Bicarbonate: 34 mmol/L — ABNORMAL HIGH (ref 20.0–28.0)
Calcium, Ion: 1.13 mmol/L — ABNORMAL LOW (ref 1.15–1.40)
HCT: 36 % (ref 36.0–46.0)
Hemoglobin: 12.2 g/dL (ref 12.0–15.0)
O2 Saturation: 64 %
Potassium: 3.9 mmol/L (ref 3.5–5.1)
Sodium: 139 mmol/L (ref 135–145)
TCO2: 36 mmol/L — ABNORMAL HIGH (ref 22–32)
pCO2, Ven: 62.5 mmHg — ABNORMAL HIGH (ref 44.0–60.0)
pH, Ven: 7.344 (ref 7.250–7.430)
pO2, Ven: 36 mmHg (ref 32.0–45.0)

## 2021-04-02 LAB — BRAIN NATRIURETIC PEPTIDE: B Natriuretic Peptide: 270.9 pg/mL — ABNORMAL HIGH (ref 0.0–100.0)

## 2021-04-02 LAB — URINALYSIS, MICROSCOPIC (REFLEX)

## 2021-04-02 LAB — URINALYSIS, ROUTINE W REFLEX MICROSCOPIC
Bilirubin Urine: NEGATIVE
Glucose, UA: 100 mg/dL — AB
Ketones, ur: NEGATIVE mg/dL
Leukocytes,Ua: NEGATIVE
Nitrite: NEGATIVE
Protein, ur: NEGATIVE mg/dL
Specific Gravity, Urine: <1.005 — ABNORMAL LOW (ref 1.005–1.030)
pH: 6.5 (ref 5.0–8.0)

## 2021-04-02 LAB — COMPREHENSIVE METABOLIC PANEL
ALT: 50 U/L — ABNORMAL HIGH (ref 0–44)
AST: 85 U/L — ABNORMAL HIGH (ref 15–41)
Albumin: 2.8 g/dL — ABNORMAL LOW (ref 3.5–5.0)
Alkaline Phosphatase: 128 U/L — ABNORMAL HIGH (ref 38–126)
Anion gap: 8 (ref 5–15)
BUN: 24 mg/dL — ABNORMAL HIGH (ref 8–23)
CO2: 31 mmol/L (ref 22–32)
Calcium: 8.3 mg/dL — ABNORMAL LOW (ref 8.9–10.3)
Chloride: 99 mmol/L (ref 98–111)
Creatinine, Ser: 0.66 mg/dL (ref 0.44–1.00)
GFR, Estimated: >60 mL/min (ref >60)
Glucose, Bld: 107 mg/dL — ABNORMAL HIGH (ref 70–99)
Potassium: 4.1 mmol/L (ref 3.5–5.1)
Sodium: 138 mmol/L (ref 135–145)
Total Bilirubin: 0.3 mg/dL (ref 0.3–1.2)
Total Protein: 6.3 g/dL — ABNORMAL LOW (ref 6.5–8.1)

## 2021-04-02 LAB — CBC WITH DIFFERENTIAL/PLATELET
Abs Immature Granulocytes: 0.07 10*3/uL (ref 0.00–0.07)
Abs Immature Granulocytes: 0.13 10*3/uL — ABNORMAL HIGH (ref 0.00–0.07)
Basophils Absolute: 0 10*3/uL (ref 0.0–0.1)
Basophils Absolute: 0.1 10*3/uL (ref 0.0–0.1)
Basophils Relative: 0 %
Basophils Relative: 0 %
Eosinophils Absolute: 0 10*3/uL (ref 0.0–0.5)
Eosinophils Absolute: 0.1 10*3/uL (ref 0.0–0.5)
Eosinophils Relative: 0 %
Eosinophils Relative: 0 %
HCT: 36 % (ref 36.0–46.0)
HCT: 37.9 % (ref 36.0–46.0)
Hemoglobin: 11.4 g/dL — ABNORMAL LOW (ref 12.0–15.0)
Hemoglobin: 11.9 g/dL — ABNORMAL LOW (ref 12.0–15.0)
Immature Granulocytes: 1 %
Immature Granulocytes: 1 %
Lymphocytes Relative: 8 %
Lymphocytes Relative: 6 %
Lymphs Abs: 1.2 10*3/uL (ref 0.7–4.0)
Lymphs Abs: 1 10*3/uL (ref 0.7–4.0)
MCH: 32.5 pg (ref 26.0–34.0)
MCH: 32.3 pg (ref 26.0–34.0)
MCHC: 31.7 g/dL (ref 30.0–36.0)
MCHC: 31.4 g/dL (ref 30.0–36.0)
MCV: 102.6 fL — ABNORMAL HIGH (ref 80.0–100.0)
MCV: 103 fL — ABNORMAL HIGH (ref 80.0–100.0)
Monocytes Absolute: 1.4 10*3/uL — ABNORMAL HIGH (ref 0.1–1.0)
Monocytes Absolute: 1.4 10*3/uL — ABNORMAL HIGH (ref 0.1–1.0)
Monocytes Relative: 9 %
Monocytes Relative: 9 %
Neutro Abs: 12.8 10*3/uL — ABNORMAL HIGH (ref 1.7–7.7)
Neutro Abs: 13.7 10*3/uL — ABNORMAL HIGH (ref 1.7–7.7)
Neutrophils Relative %: 82 %
Neutrophils Relative %: 84 %
Platelets: 343 10*3/uL (ref 150–400)
Platelets: 381 10*3/uL (ref 150–400)
RBC: 3.51 MIL/uL — ABNORMAL LOW (ref 3.87–5.11)
RBC: 3.68 MIL/uL — ABNORMAL LOW (ref 3.87–5.11)
RDW: 12.8 % (ref 11.5–15.5)
RDW: 12.7 % (ref 11.5–15.5)
WBC: 15.5 10*3/uL — ABNORMAL HIGH (ref 4.0–10.5)
WBC: 16.4 10*3/uL — ABNORMAL HIGH (ref 4.0–10.5)
nRBC: 0 % (ref 0.0–0.2)
nRBC: 0 % (ref 0.0–0.2)

## 2021-04-02 LAB — RESP PANEL BY RT-PCR (FLU A&B, COVID) ARPGX2
Influenza A by PCR: NEGATIVE
Influenza B by PCR: NEGATIVE
SARS Coronavirus 2 by RT PCR: NEGATIVE

## 2021-04-02 LAB — LIPID PANEL
Cholesterol: 142 mg/dL (ref 0–200)
HDL: 50 mg/dL (ref >40)
LDL Cholesterol: 77 mg/dL (ref 0–99)
Total CHOL/HDL Ratio: 2.8 RATIO
Triglycerides: 75 mg/dL (ref <150)
VLDL: 15 mg/dL (ref 0–40)

## 2021-04-02 LAB — BASIC METABOLIC PANEL
Anion gap: 9 (ref 5–15)
BUN: 27 mg/dL — ABNORMAL HIGH (ref 8–23)
CO2: 29 mmol/L (ref 22–32)
Calcium: 8.3 mg/dL — ABNORMAL LOW (ref 8.9–10.3)
Chloride: 98 mmol/L (ref 98–111)
Creatinine, Ser: 0.73 mg/dL (ref 0.44–1.00)
GFR, Estimated: >60 mL/min (ref >60)
Glucose, Bld: 117 mg/dL — ABNORMAL HIGH (ref 70–99)
Potassium: 4.7 mmol/L (ref 3.5–5.1)
Sodium: 136 mmol/L (ref 135–145)

## 2021-04-02 LAB — TROPONIN I (HIGH SENSITIVITY)
Troponin I (High Sensitivity): 76 ng/L — ABNORMAL HIGH (ref <18)
Troponin I (High Sensitivity): 123 ng/L (ref <18)

## 2021-04-02 LAB — SALICYLATE LEVEL: Salicylate Lvl: <7 mg/dL — ABNORMAL LOW (ref 7.0–30.0)

## 2021-04-02 LAB — HEMOGLOBIN A1C
Hgb A1c MFr Bld: 5.9 % — ABNORMAL HIGH (ref 4.8–5.6)
Mean Plasma Glucose: 122.63 mg/dL

## 2021-04-02 LAB — ETHANOL: Alcohol, Ethyl (B): <10 mg/dL (ref <10)

## 2021-04-02 LAB — ACETAMINOPHEN LEVEL: Acetaminophen (Tylenol), Serum: <10 ug/mL — ABNORMAL LOW (ref 10–30)

## 2021-04-02 MED ORDER — ALBUTEROL SULFATE HFA 108 (90 BASE) MCG/ACT IN AERS: 2.0000 | INHALATION_SPRAY | Freq: Four times a day (QID) | RESPIRATORY_TRACT | Status: DC | PRN

## 2021-04-02 MED ORDER — SODIUM CHLORIDE 0.9 % IV BOLUS
500.0000 mL | Freq: Once | INTRAVENOUS | Status: AC
  Administered 2021-04-02: 500 mL via INTRAVENOUS

## 2021-04-02 MED ORDER — OLANZAPINE 5 MG PO TABS
5.0000 mg | ORAL_TABLET | Freq: Every day | ORAL | Status: DC
  Administered 2021-04-02 – 2021-04-08 (×7): 5 mg via ORAL
  Filled 2021-04-02 (×8): qty 1

## 2021-04-02 MED ORDER — HALOPERIDOL LACTATE 5 MG/ML IJ SOLN
4.0000 mg | Freq: Once | INTRAMUSCULAR | Status: AC
  Administered 2021-04-02: 4 mg via INTRAMUSCULAR
  Filled 2021-04-02: qty 1

## 2021-04-02 MED ORDER — OLANZAPINE 5 MG PO TABS
2.5000 mg | ORAL_TABLET | Freq: Two times a day (BID) | ORAL | Status: DC | PRN
  Administered 2021-04-03 – 2021-04-05 (×4): 2.5 mg via ORAL
  Filled 2021-04-02 (×5): qty 1

## 2021-04-02 MED ORDER — HALOPERIDOL LACTATE 5 MG/ML IJ SOLN: 1.0000 mg | Freq: Once | INTRAMUSCULAR | Status: DC | PRN

## 2021-04-02 MED ORDER — NICOTINE 7 MG/24HR TD PT24
7.0000 mg | MEDICATED_PATCH | Freq: Every day | TRANSDERMAL | Status: DC
  Filled 2021-04-02: qty 1

## 2021-04-02 MED ORDER — OLANZAPINE 2.5 MG PO TABS
2.5000 mg | ORAL_TABLET | Freq: Two times a day (BID) | ORAL | 0 refills | Status: DC | PRN
  Filled 2021-04-02: qty 60, 30d supply, fill #0

## 2021-04-02 MED ORDER — ALBUTEROL SULFATE HFA 108 (90 BASE) MCG/ACT IN AERS
2.0000 | INHALATION_SPRAY | Freq: Four times a day (QID) | RESPIRATORY_TRACT | 2 refills | Status: DC | PRN
  Filled 2021-04-02: qty 8.5, 25d supply, fill #0

## 2021-04-02 MED ORDER — AZITHROMYCIN 500 MG PO TABS
500.0000 mg | ORAL_TABLET | Freq: Every day | ORAL | 0 refills | Status: DC
  Filled 2021-04-02: qty 3, 3d supply, fill #0

## 2021-04-02 MED ORDER — SODIUM CHLORIDE 0.9 % IV SOLN
1.0000 g | Freq: Once | INTRAVENOUS | Status: AC
  Administered 2021-04-02: 1 g via INTRAVENOUS
  Filled 2021-04-02: qty 10

## 2021-04-02 MED ORDER — HALOPERIDOL 0.5 MG PO TABS
0.5000 mg | ORAL_TABLET | Freq: Once | ORAL | Status: DC | PRN
  Filled 2021-04-02: qty 1

## 2021-04-02 MED ORDER — CEFDINIR 300 MG PO CAPS
300.0000 mg | ORAL_CAPSULE | Freq: Two times a day (BID) | ORAL | 0 refills | Status: DC
  Filled 2021-04-02: qty 6, 3d supply, fill #0

## 2021-04-02 MED ORDER — IPRATROPIUM-ALBUTEROL 0.5-2.5 (3) MG/3ML IN SOLN
3.0000 mL | Freq: Once | RESPIRATORY_TRACT | Status: AC
  Administered 2021-04-02: 3 mL via RESPIRATORY_TRACT
  Filled 2021-04-02: qty 3

## 2021-04-02 MED ORDER — ENOXAPARIN SODIUM 40 MG/0.4ML IJ SOSY
40.0000 mg | PREFILLED_SYRINGE | INTRAMUSCULAR | Status: DC
  Administered 2021-04-02 – 2021-04-05 (×4): 40 mg via SUBCUTANEOUS
  Filled 2021-04-02 (×7): qty 0.4

## 2021-04-02 MED ORDER — OLANZAPINE 5 MG PO TBDP: 2.5000 mg | ORAL_TABLET | Freq: Two times a day (BID) | ORAL | Status: DC | PRN

## 2021-04-02 MED ORDER — SODIUM CHLORIDE 0.9 % IV SOLN
500.0000 mg | Freq: Once | INTRAVENOUS | Status: AC
  Administered 2021-04-02: 500 mg via INTRAVENOUS
  Filled 2021-04-02: qty 5

## 2021-04-02 MED ORDER — CEFDINIR 300 MG PO CAPS
300.0000 mg | ORAL_CAPSULE | Freq: Two times a day (BID) | ORAL | Status: DC
  Administered 2021-04-03: 300 mg via ORAL
  Filled 2021-04-02 (×2): qty 1

## 2021-04-02 MED ORDER — OLANZAPINE 5 MG PO TABS
5.0000 mg | ORAL_TABLET | Freq: Every day | ORAL | 0 refills | Status: DC
  Filled 2021-04-02: qty 30, 30d supply, fill #0

## 2021-04-02 MED ORDER — AZITHROMYCIN 250 MG PO TABS
250.0000 mg | ORAL_TABLET | Freq: Every day | ORAL | Status: DC
Start: 2021-04-03 — End: 2021-04-03

## 2021-04-02 MED ORDER — ACETAMINOPHEN 650 MG RE SUPP: 650.0000 mg | Freq: Four times a day (QID) | RECTAL | Status: DC | PRN

## 2021-04-02 MED ORDER — GUAIFENESIN 100 MG/5ML PO LIQD
5.0000 mL | ORAL | Status: DC | PRN
  Filled 2021-04-02: qty 5

## 2021-04-02 MED ORDER — OLANZAPINE 5 MG PO TABS: 5.0000 mg | ORAL_TABLET | Freq: Every day | ORAL | Status: DC

## 2021-04-02 MED ORDER — IPRATROPIUM-ALBUTEROL 0.5-2.5 (3) MG/3ML IN SOLN
3.0000 mL | Freq: Four times a day (QID) | RESPIRATORY_TRACT | Status: DC | PRN
  Administered 2021-04-03: 3 mL via RESPIRATORY_TRACT
  Filled 2021-04-02: qty 3

## 2021-04-02 MED ORDER — ACETAMINOPHEN 325 MG PO TABS: 650.0000 mg | ORAL_TABLET | Freq: Four times a day (QID) | ORAL | Status: DC | PRN

## 2021-04-02 MED ORDER — OLANZAPINE 5 MG PO TBDP: 5.0000 mg | ORAL_TABLET | Freq: Every day | ORAL | Status: DC

## 2021-04-02 NOTE — ED Notes (Signed)
Pt dressed and walking outside of room stating she is going to leave - pt walking to door - MD paged and states pt is to be IVC - security called - pt back to room  MD Christian at bedside - pt to be IVC - security at bedside

## 2021-04-02 NOTE — ED Notes (Signed)
This RN found pt to have gotten out of bed and standing on side of bed, stating that she needed to be walked around in the hall and go smoke a cigarette - this RN informed pt that we could not let her go smoke a cigarette - pt redirected back to bed and provided with a snack at this time  Pt seems to be having some hallucinations speaking about a man that followed this RN into room and talking to people who aren't there

## 2021-04-02 NOTE — ED Notes (Signed)
Breakfast order placed ?

## 2021-04-02 NOTE — Consult Note (Signed)
AssessingReason for Consult: Acute Psychosis; Untreated Schizoaffective Disorder Referring Physician: Mitzi Hansen, MD   Assessment/Plan: Angela Medina is a 70 y.o. female admitted medically for 03/31/2021 10:16 PM for Bronchopneumonia. She carries the psychiatric diagnoses of Schizoaffective Disorder and has a past medical history of Aneurysm ([place]), Depression, Schizoaffective disorder ([place]), Vision changes, and COVID. Psychiatry was consulted for Acute Psychosis; Untreated Schizoaffective Disorder.   She meets criteria for an unspecified schizophrenic disorder based on delusions and paranoia and her reports of past issues.  Unable to confirm if it is Schizoaffective but given her certainty about the diagnosis this is most likely accurate.  She has had no outpatient psychotropic medications for several years but was previously on Zyprexa and historically she reports she has had a good response to these medications.  On initial examination, patient was sitting at bedside chair with plastic "patient belongings" bags on both feet with disheveled, matted hair.    Patient does have delusions and paranoia, unsure if this is her baseline as she is not agitated by them at the moment and does not meet criteria for IVC.  She has been able to function in the community for several years while homeless and not receiving treatment.  Based on reports from yesterday it appears she had delirium secondary to her Pneumonia which has improved with antibiotics.  At this time since she is willing to restart Zyprexa we will recommend stopping her Haldol and starting Zyprexa.  Discussed with Primary Team that if patient decides to leave to provide her with info for [facility] as she would be able to receive medication management as a [employer] resident.   Recommendations: -Stop Haldol -Start Zyprexa 5 mg QHS -Start Zyprexa Zydis 2.5 mg PRN BID for agitation with SI and/or HI    Continue rest of care per Primary  Team These recommendations have been discussed with the Primary Team Psychiatry will continue to follow the patient    Angela Medina is an 70 y.o. female.  HPI: 70 year old homeless female with past medical history of Aneurysm ([place]), Depression, Schizoaffective disorder ([place]), Vision changes, and COVID, who presented to [facility] emergency department for shortness of breath. History taking is complicated by untreated mental health disorders.  While still in the ED became extremely agitated, stating that something got put in her orange juice, that is just been given to her by nursing staff, that caused her to have terrible reaction and contaminated her blood.  She also reported seeing an assassin/contract for hire hit man that was walking back and forth in front of her room.  She implies that she knows these men and notes that the government has hired these individuals to look for her because she is using microwave signals to keep them from knowing what she is thinking.   When entering the room patient was sitting in bedside chair watching TV.  She was in the hospital gown with patient belonging bags on her feet.  Started the interview by assessing for orientation and patient was oriented to person, place, time, and situation.  On assessing for delirium patient initially answer the questions with noncommittal answers however when reinstructed to answer yes or no answers all 4 correctly.  She was able to recite DOWB and count from 42-27 correctly.  She states that she has been homeless for about 4 years.  She states she was kicked out of her apartment by police because it was messy.  She states that she is been homeless ever since.  She  reports that she has been diagnosed with schizoaffective disorder in the past.  She reports that she has not taken medication for it in several years.  She reports previously being on Zyprexa, Xanax, and Ambien and was unable to list any other medications.  She does  report that she thinks she did good when she had Zyprexa.  She states that she was on either 5 or 10 mg she could not remember which but does state that she was never on more than 15 mg.  She reports no prior psychiatric hospitalizations.  She states that she has no SI, HI, or AVH today.  When asked when the last time she had AVH she states a while ago.  She reports no alcohol use.  She reports smoking occasionally.  She reports no illicit substance use.  She then asked if she would be allowed to go outside to smoke a cigarette to calm her nerves if someone came with her.  When asked if she thought anyone was out to get her she stated yes.  When asked that he thought her orange juice had been poisoned she stated that no it simply was having a reaction with her.  When asked if she would be willing to restart her Zyprexa she stated that she would.    Past Medical History:  Diagnosis Date   Aneurysm ([place])    Depression    Schizoaffective disorder ([place])    Vision changes    right eye    Past Surgical History:  Procedure Laterality Date   FOOT SURGERY      No family history on file.  Social History:  reports that she has been smoking cigarettes. She has never used smokeless tobacco. She reports that she does not currently use alcohol. No history on file for drug use.  Allergies:  Allergies  Allergen Reactions   Penicillins Other (See Comments)    Unknown- childhood allergy    Medications: I have reviewed the patient's current medications. Prior to Admission: (Not in a hospital admission)  Scheduled:  enoxaparin (LOVENOX) injection  40 mg Subcutaneous Daily   haloperidol  4 mg Oral BID   Or   haloperidol lactate  4 mg Intramuscular BID   ipratropium-albuterol  3 mL Nebulization Once   Continuous:  azithromycin 500 mg (04/02/21 0907)   cefTRIAXone (ROCEPHIN)  IV Stopped (04/02/21 0906)   [id number], haloperidol **OR** [DISCONTINUED] haloperidol lactate Anti-infectives  (From admission, onward)    Start     Dose/Rate Route Frequency Ordered Stop   04/01/21 0915  cefTRIAXone (ROCEPHIN) 2 g in sodium chloride 0.9 % 100 mL IVPB        2 g 200 mL/hr over 30 Minutes Intravenous Every 24 hours 04/01/21 0914 04/06/21 0914   04/01/21 0915  azithromycin (ZITHROMAX) 500 mg in sodium chloride 0.9 % 250 mL IVPB        500 mg 250 mL/hr over 60 Minutes Intravenous Every 24 hours 04/01/21 0914 04/06/21 0914   04/01/21 0800  cefTRIAXone (ROCEPHIN) 1 g in sodium chloride 0.9 % 100 mL IVPB        1 g 200 mL/hr over 30 Minutes Intravenous  Once 04/01/21 0759 04/01/21 1107   04/01/21 0800  azithromycin (ZITHROMAX) 500 mg in sodium chloride 0.9 % 250 mL IVPB        500 mg 250 mL/hr over 60 Minutes Intravenous  Once 04/01/21 0759 04/01/21 1259       Results for orders placed or performed during the  hospital encounter of 03/31/21 (from the past 48 hour(s))  Resp Panel by RT-PCR (Flu A&B, Covid) Nasopharyngeal Swab     Status: None   Collection Time: 03/31/21 10:33 PM   Specimen: Nasopharyngeal Swab; Nasopharyngeal(NP) swabs in vial transport medium  Result Value Ref Range   SARS Coronavirus 2 by RT PCR NEGATIVE NEGATIVE    Comment: (NOTE) SARS-CoV-2 target nucleic acids are NOT DETECTED.  The SARS-CoV-2 RNA is generally detectable in upper respiratory specimens during the acute phase of infection. The lowest concentration of SARS-CoV-2 viral copies this assay can detect is 138 copies/mL. A negative result does not preclude SARS-Cov-2 infection and should not be used as the sole basis for treatment or other patient management decisions. A negative result may occur with  improper specimen collection/handling, submission of specimen other than nasopharyngeal swab, presence of viral mutation(s) within the areas targeted by this assay, and inadequate number of viral copies(<138 copies/mL). A negative result must be combined with clinical observations, patient history, and  epidemiological information. The expected result is Negative.  Fact Sheet for Patients:  EntrepreneurPulse.com.au  Fact Sheet for Healthcare Providers:  IncredibleEmployment.be  This test is no t yet approved or cleared by the [place] FDA and  has been authorized for detection and/or diagnosis of SARS-CoV-2 by FDA under an Emergency Use Authorization (EUA). This EUA will remain  in effect (meaning this test can be used) for the duration of the COVID-19 declaration under Section 564(b)(1) of the Act, 21 U.S.C.section 360bbb-3(b)(1), unless the authorization is terminated  or revoked sooner.       Influenza A by PCR NEGATIVE NEGATIVE   Influenza B by PCR NEGATIVE NEGATIVE    Comment: (NOTE) The Xpert Xpress SARS-CoV-2/FLU/RSV plus assay is intended as an aid in the diagnosis of influenza from Nasopharyngeal swab specimens and should not be used as a sole basis for treatment. Nasal washings and aspirates are unacceptable for Xpert Xpress SARS-CoV-2/FLU/RSV testing.  Fact Sheet for Patients: EntrepreneurPulse.com.au  Fact Sheet for Healthcare Providers: IncredibleEmployment.be  This test is not yet approved or cleared by the [place] FDA and has been authorized for detection and/or diagnosis of SARS-CoV-2 by FDA under an Emergency Use Authorization (EUA). This EUA will remain in effect (meaning this test can be used) for the duration of the COVID-19 declaration under Section 564(b)(1) of the Act, 21 U.S.C. section 360bbb-3(b)(1), unless the authorization is terminated or revoked.  Performed at [facility], [place] [address], [place], [place] [address]   Comprehensive metabolic panel     Status: Abnormal   Collection Time: 03/31/21 10:48 PM  Result Value Ref Range   Sodium 134 (L) 135 - 145 mmol/L   Potassium 3.4 (L) 3.5 - 5.1 mmol/L   Chloride 96 (L) 98 - 111 mmol/L   CO2 28 22 - 32  mmol/L   Glucose, Bld 117 (H) 70 - 99 mg/dL    Comment: Glucose reference range applies only to samples taken after fasting for at least 8 hours.   BUN 12 8 - 23 mg/dL   Creatinine, Ser 0.63 0.44 - 1.00 mg/dL   Calcium 8.3 (L) 8.9 - 10.3 mg/dL   Total Protein 6.4 (L) 6.5 - 8.1 g/dL   Albumin 2.9 (L) 3.5 - 5.0 g/dL   AST 20 15 - 41 U/L   ALT 14 0 - 44 U/L   Alkaline Phosphatase 90 38 - 126 U/L   Total Bilirubin 0.5 0.3 - 1.2 mg/dL   GFR, Estimated >  60 >60 mL/min    Comment: (NOTE) Calculated using the CKD-EPI Creatinine Equation (2021)    Anion gap 10 5 - 15    Comment: Performed at [facility], [place] [address], [place], [place] [address]  CBC with Differential     Status: Abnormal   Collection Time: 03/31/21 10:48 PM  Result Value Ref Range   WBC 18.6 (H) 4.0 - 10.5 K/uL   RBC 3.76 (L) 3.87 - 5.11 MIL/uL   Hemoglobin 12.0 12.0 - 15.0 g/dL   HCT 37.0 36.0 - 46.0 %   MCV 98.4 80.0 - 100.0 fL   MCH 31.9 26.0 - 34.0 pg   MCHC 32.4 30.0 - 36.0 g/dL   RDW 12.8 11.5 - 15.5 %   Platelets 357 150 - 400 K/uL   nRBC 0.0 0.0 - 0.2 %   Neutrophils Relative % 81 %   Neutro Abs 14.9 (H) 1.7 - 7.7 K/uL   Lymphocytes Relative 8 %   Lymphs Abs 1.5 0.7 - 4.0 K/uL   Monocytes Relative 11 %   Monocytes Absolute 2.0 (H) 0.1 - 1.0 K/uL   Eosinophils Relative 0 %   Eosinophils Absolute 0.0 0.0 - 0.5 K/uL   Basophils Relative 0 %   Basophils Absolute 0.1 0.0 - 0.1 K/uL   Immature Granulocytes 0 %   Abs Immature Granulocytes 0.07 0.00 - 0.07 K/uL    Comment: Performed at [facility], [place] [address], [place], [place] [address]  Troponin I (High Sensitivity)     Status: Abnormal   Collection Time: 03/31/21 10:48 PM  Result Value Ref Range   Troponin I (High Sensitivity) 24 (H) <18 ng/L    Comment: (NOTE) Elevated high sensitivity troponin I (hsTnI) values and significant  changes across serial measurements may suggest ACS but many other  chronic and acute conditions are known to  elevate hsTnI results.  Refer to the "Links" section for chest pain algorithms and additional  guidance. Performed at [facility], [place] [address] [place], [place] [address]   Troponin I (High Sensitivity)     Status: Abnormal   Collection Time: 04/01/21  2:04 AM  Result Value Ref Range   Troponin I (High Sensitivity) 44 (H) <18 ng/L    Comment: RESULT CALLED TO, READ BACK BY AND VERIFIED WITH:  B. MONTEE, RN, 0344, 04/01/21, E. ADEDOKUN. (NOTE) Elevated high sensitivity troponin I (hsTnI) values and significant  changes across serial measurements may suggest ACS but many other  chronic and acute conditions are known to elevate hsTnI results.  Refer to the Links section for chest pain algorithms and additional  guidance. Performed at [facility], [place] [address], [place], [place] [address]   Troponin I (High Sensitivity)     Status: Abnormal   Collection Time: 04/01/21  5:23 AM  Result Value Ref Range   Troponin I (High Sensitivity) 19 (H) <18 ng/L    Comment: DELTA CHECK NOTED (NOTE) Elevated high sensitivity troponin I (hsTnI) values and significant  changes across serial measurements may suggest ACS but many other  chronic and acute conditions are known to elevate hsTnI results.  Refer to the Links section for chest pain algorithms and additional  guidance. Performed at [facility], [place] [address] [place], [place] [address]   D-dimer, quantitative     Status: Abnormal   Collection Time: 04/01/21  5:23 AM  Result Value Ref Range   D-Dimer, Quant 1.26 (H) 0.00 -  0.50 ug/mL-FEU    Comment: (NOTE) At the manufacturer cut-off value of 0.5 g/mL FEU, this assay has a negative predictive value of 95-100%.This assay is intended for use in conjunction with a clinical pretest probability (PTP) assessment model to exclude pulmonary embolism (PE) and deep venous thrombosis (DVT) in outpatients suspected of PE or DVT. Results should be correlated with clinical  presentation. Performed at [facility], [place] [address], [place], [place] [address]   Brain natriuretic peptide     Status: Abnormal   Collection Time: 04/01/21  5:23 AM  Result Value Ref Range   B Natriuretic Peptide 169.3 (H) 0.0 - 100.0 pg/mL    Comment: Performed at [place] [address], [place], [place] [address]  Troponin I (High Sensitivity)     Status: Abnormal   Collection Time: 04/01/21  7:10 AM  Result Value Ref Range   Troponin I (High Sensitivity) 20 (H) <18 ng/L    Comment: (NOTE) Elevated high sensitivity troponin I (hsTnI) values and significant  changes across serial measurements may suggest ACS but many other  chronic and acute conditions are known to elevate hsTnI results.  Refer to the "Links" section for chest pain algorithms and additional  guidance. Performed at [facility], [place] [address], [place], [place] [address]   HIV Antibody (routine testing w rflx)     Status: None   Collection Time: 04/01/21  9:06 AM  Result Value Ref Range   HIV Screen 4th Generation wRfx Non Reactive Non Reactive    Comment: Performed at [facility], [place] [address] [place], [place] [id number]  Basic metabolic panel     Status: Abnormal   Collection Time: 04/01/21  9:06 AM  Result Value Ref Range   Sodium 132 (L) 135 - 145 mmol/L   Potassium 3.9 3.5 - 5.1 mmol/L   Chloride 95 (L) 98 - 111 mmol/L   CO2 27 22 - 32 mmol/L   Glucose, Bld 161 (H) 70 - 99 mg/dL    Comment: Glucose reference range applies only to samples taken after fasting for at least 8 hours.   BUN 11 8 - 23 mg/dL   Creatinine, Ser 0.67 0.44 - 1.00 mg/dL   Calcium 8.1 (L) 8.9 - 10.3 mg/dL   GFR, Estimated >60 >60 mL/min    Comment: (NOTE) Calculated using the CKD-EPI Creatinine Equation (2021)    Anion gap 10 5 - 15    Comment: Performed at [place] [address], [place], [place] [address]  CBC WITH DIFFERENTIAL     Status: Abnormal   Collection Time: 04/01/21   9:06 AM  Result Value Ref Range   WBC 17.5 (H) 4.0 - 10.5 K/uL   RBC 3.69 (L) 3.87 - 5.11 MIL/uL   Hemoglobin 12.1 12.0 - 15.0 g/dL   HCT 36.8 36.0 - 46.0 %   MCV 99.7 80.0 - 100.0 fL   MCH 32.8 26.0 - 34.0 pg   MCHC 32.9 30.0 - 36.0 g/dL   RDW 12.9 11.5 - 15.5 %   Platelets 346 150 - 400 K/uL   nRBC 0.0 0.0 - 0.2 %   Neutrophils Relative % 91 %   Neutro Abs 16.1 (H) 1.7 - 7.7 K/uL   Lymphocytes Relative 4 %   Lymphs Abs 0.6 (L) 0.7 - 4.0 K/uL   Monocytes Relative 4 %   Monocytes Absolute 0.6 0.1 - 1.0 K/uL   Eosinophils Relative 0 %  Eosinophils Absolute 0.0 0.0 - 0.5 K/uL   Basophils Relative 0 %   Basophils Absolute 0.0 0.0 - 0.1 K/uL   Immature Granulocytes 1 %   Abs Immature Granulocytes 0.12 (H) 0.00 - 0.07 K/uL    Comment: Performed at [facility], [address], [place], [place] [address]  I-Stat arterial blood gas, ED     Status: Abnormal   Collection Time: 04/01/21 10:52 AM  Result Value Ref Range   pH, Arterial 7.405 7.350 - 7.450   pCO2 arterial 47.9 32.0 - 48.0 mmHg   pO2, Arterial 54 (L) 83.0 - 108.0 mmHg   Bicarbonate 30.0 (H) 20.0 - 28.0 mmol/L   TCO2 31 22 - 32 mmol/L   O2 Saturation 88.0 %   Acid-Base Excess 4.0 (H) 0.0 - 2.0 mmol/L   Sodium 132 (L) 135 - 145 mmol/L   Potassium 3.7 3.5 - 5.1 mmol/L   Calcium, Ion 1.12 (L) 1.15 - 1.40 mmol/L   HCT 35.0 (L) 36.0 - 46.0 %   Hemoglobin 11.9 (L) 12.0 - 15.0 g/dL   Sample type ARTERIAL   Basic metabolic panel     Status: Abnormal   Collection Time: 04/02/21  5:00 AM  Result Value Ref Range   Sodium 136 135 - 145 mmol/L   Potassium 4.7 3.5 - 5.1 mmol/L    Comment: DELTA CHECK NOTED   Chloride 98 98 - 111 mmol/L   CO2 29 22 - 32 mmol/L   Glucose, Bld 117 (H) 70 - 99 mg/dL    Comment: Glucose reference range applies only to samples taken after fasting for at least 8 hours.   BUN 27 (H) 8 - 23 mg/dL   Creatinine, Ser 0.10 0.44 - 1.00 mg/dL   Calcium 8.3 (L) 8.9 - 10.3 mg/dL   GFR, Estimated >27  >25 mL/min    Comment: (NOTE) Calculated using the CKD-EPI Creatinine Equation (2021)    Anion gap 9 5 - 15    Comment: Performed at [facility], [address] [place], [place] [address]  CBC WITH DIFFERENTIAL     Status: Abnormal   Collection Time: 04/02/21  5:00 AM  Result Value Ref Range   WBC 15.5 (H) 4.0 - 10.5 K/uL   RBC 3.51 (L) 3.87 - 5.11 MIL/uL   Hemoglobin 11.4 (L) 12.0 - 15.0 g/dL   HCT 03.4 74.2 - 59.5 %   MCV 102.6 (H) 80.0 - 100.0 fL   MCH 32.5 26.0 - 34.0 pg   MCHC 31.7 30.0 - 36.0 g/dL   RDW 63.8 75.6 - 43.3 %   Platelets 343 150 - 400 K/uL   nRBC 0.0 0.0 - 0.2 %   Neutrophils Relative % 82 %   Neutro Abs 12.8 (H) 1.7 - 7.7 K/uL   Lymphocytes Relative 8 %   Lymphs Abs 1.2 0.7 - 4.0 K/uL   Monocytes Relative 9 %   Monocytes Absolute 1.4 (H) 0.1 - 1.0 K/uL   Eosinophils Relative 0 %   Eosinophils Absolute 0.0 0.0 - 0.5 K/uL   Basophils Relative 0 %   Basophils Absolute 0.0 0.0 - 0.1 K/uL   Immature Granulocytes 1 %   Abs Immature Granulocytes 0.07 0.00 - 0.07 K/uL    Comment: Performed at [facility], [address], [place], [place] [address]  Hemoglobin A1c     Status: Abnormal   Collection Time: 04/02/21  5:00 AM  Result Value Ref Range   Hgb A1c MFr  Bld 5.9 (H) 4.8 - 5.6 %    Comment: (NOTE) Pre diabetes:          5.7%-6.4%  Diabetes:              >6.4%  Glycemic control for   <7.0% adults with diabetes    Mean Plasma Glucose 122.63 mg/dL    Comment: Performed at [facility], [place] [address] [place], [place] [address]  Lipid panel     Status: None   Collection Time: 04/02/21  5:00 AM  Result Value Ref Range   Cholesterol 142 0 - 200 mg/dL   Triglycerides 75 <150 mg/dL   HDL 50 >40 mg/dL   Total CHOL/HDL Ratio 2.8 RATIO   VLDL 15 0 - 40 mg/dL   LDL Cholesterol 77 0 - 99 mg/dL    Comment:        Total Cholesterol/HDL:CHD Risk Coronary Heart Disease Risk Table                     Men   Women  1/2 Average Risk   3.4    3.3  Average Risk       5.0   4.4  2 X Average Risk   9.6   7.1  3 X Average Risk  23.4   11.0        Use the calculated Patient Ratio above and the CHD Risk Table to determine the patient's CHD Risk.        ATP III CLASSIFICATION (LDL):  <100     mg/dL   Optimal  100-129  mg/dL   Near or Above                    Optimal  130-159  mg/dL   Borderline  160-189  mg/dL   High  >190     mg/dL   Very High Performed at [place] [address] [place], [place] [address]   Ethanol     Status: None   Collection Time: 04/02/21  7:44 AM  Result Value Ref Range   Alcohol, Ethyl (B) <10 <10 mg/dL    Comment: (NOTE) Lowest detectable limit for serum alcohol is 10 mg/dL.  For medical purposes only. Performed at [facility], [place] [address] [place], [place] [id number]   Salicylate level     Status: Abnormal   Collection Time: 04/02/21  7:44 AM  Result Value Ref Range   Salicylate Lvl [id number] (L) 7.0 - 30.0 mg/dL    Comment: Performed at [facility] [address], [place], [place] [address]  Acetaminophen level     Status: Abnormal   Collection Time: 04/02/21  7:44 AM  Result Value Ref Range   Acetaminophen (Tylenol), Serum <10 (L) 10 - 30 ug/mL    Comment: (NOTE) Therapeutic concentrations vary significantly. A range of 10-30 ug/mL  may be an effective concentration for many patients. However, some  are best treated at concentrations outside of this range. Acetaminophen concentrations >150 ug/mL at 4 hours after ingestion  and >50 ug/mL at 12 hours after ingestion are often associated with  toxic reactions.  Performed at [facility], [place] [address], [place], [place] [address]     DG Chest 2 View  Result Date: 03/31/2021 CLINICAL DATA:  Cough, shortness of breath. EXAM: CHEST - 2 VIEW COMPARISON:  03/14/2021. FINDINGS: The heart size and mediastinal contours are stable. Mild atherosclerotic calcification of  the aorta is noted. Interstitial prominence  is noted in the lungs bilaterally and unchanged from the prior exam and may be chronic. No consolidation, effusion, or pneumothorax. Stable compression deformities and kyphosis are noted in the midthoracic spine. No acute osseous abnormality. IMPRESSION: Cardiomegaly with no acute cardiopulmonary process. Electronically Signed   By: Brett Fairy M.D.   On: 03/31/2021 23:46   CT Angio Chest PE W and/or Wo Contrast  Result Date: 04/01/2021 CLINICAL DATA:  70 year old female with shortness of breath and altered mental status. EXAM: CT ANGIOGRAPHY CHEST WITH CONTRAST TECHNIQUE: Multidetector CT imaging of the chest was performed using the standard protocol during bolus administration of intravenous contrast. Multiplanar CT image reconstructions and MIPs were obtained to evaluate the vascular anatomy. CONTRAST:  18mL OMNIPAQUE IOHEXOL 350 MG/ML SOLN COMPARISON:  Chest radiographs 03/31/2021. FINDINGS: Cardiovascular: Good contrast bolus timing in the pulmonary arterial tree. No focal filling defect identified in the pulmonary arteries to suggest acute pulmonary embolism. Highly tortuous thoracic aorta with Calcified aortic atherosclerosis. No aortic dissection or saccular aneurysm. Tortuous proximal great vessels. Mild cardiomegaly. No pericardial effusion. Mediastinum/Nodes: No mediastinal mass or lymphadenopathy. Lungs/Pleura: Major airways are patent but there is widespread nodular and confluent peribronchial opacity in the posterior basal segment of the left lower lobe, associated with some segmental or subsegmental airway opacification there. Generalized central bronchial wall thickening elsewhere. Early similar patchy and nodular peribronchial opacity in the right lower lobe and lateral segment of the right middle lobe, with some consolidation in the latter. Early tree-in-bud nodular opacities in the right upper lobe series 6, image 38. Left upper lobe spared. Mild atelectasis suspected in the lingula. No  pleural effusion. Upper Abdomen: Negative visible liver, spleen, pancreas, kidneys and bowel in the upper abdomen. Thickening of the adrenal glands such as due to adrenal hyperplasia. Musculoskeletal: T8 vertebra plana with associated chronic ankylosis with the T7 and T9 vertebral bodies. Subsequent focal thoracic kyphosis. Very severe upper lumbar disc, endplate, and facet degeneration partially visible. Chronic appearing right lateral 8 and/or 9th rib fractures partially visible. More acute to subacute appearing anterior left 2nd rib fracture series 6, image 35 with similar anterior 3rd through 5th rib fractures. Faint periosteal reaction at the 4th rib. No other No acute osseous abnormality identified. Review of the MIP images confirms the above findings. IMPRESSION: 1. Negative for acute pulmonary embolus. 2. Bilateral bronchopneumonia, most confluent in the left lower lobe. But right middle and lower lobe involvement, early right upper lobe involvement. No pleural effusion. 3. Acute to subacute appearing left 2nd through 5th anterior rib fractures. 4. 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 5. Highly tortuous aorta with Aortic atherosclerosis (ICD10-I70.0). Electronically Signed   By: Genevie Ann M.D.   On: 04/01/2021 07:48     Blood pressure 127/80, pulse 86, temperature 98.8 F (37.1 C), temperature source Oral, resp. rate (!) 22, SpO2 100 %. Psychiatric Specialty Exam: Physical Exam Constitutional:      General: She is not in acute distress.    Appearance: She is not ill-appearing or toxic-appearing.  HENT:     Head: Normocephalic and atraumatic.  Pulmonary:     Effort: Pulmonary effort is normal.     Comments: On room air, coughing occasionally Musculoskeletal:        General: Normal range of motion.  Neurological:     General: No focal deficit present.     Mental Status: She is alert.    Review of Systems  Respiratory:  Positive  for cough.   Gastrointestinal:  Negative for  abdominal pain.  Neurological:  Negative for weakness and headaches.  Psychiatric/Behavioral:  Negative for agitation, hallucinations and suicidal ideas. The patient is not nervous/anxious.    Blood pressure 127/80, pulse 86, temperature 98.8 F (37.1 C), temperature source Oral, resp. rate (!) 22, SpO2 100 %.There is no height or weight on file to calculate BMI.  General Appearance: Bizarre, Disheveled, and hair is matted and patient is wearing "Patient Belongings" plastic bags on feet.  Eye Contact:  Fair  Speech:  Garbled, Normal Rate, and Slurred  Volume:  Normal  Mood:  Irritable  Affect:  Congruent  Thought Process:  Coherent  Orientation:  Full (Time, Place, and Person)  Thought Content:  Delusions and Paranoid Ideation  Suicidal Thoughts:  No  Homicidal Thoughts:  No  Memory:  Immediate;   Fair Recent;   Fair  Judgement:  Intact  Insight:  Present  Psychomotor Activity:  Normal  Concentration:  Concentration: Fair and Attention Span: Fair  Recall:  [employer] of Knowledge:  Fair  Language:  Fair  Akathisia:  Negative  Handed:  Right  AIMS (if indicated):     Assets:  Resilience  ADL's:  Impaired  Cognition:  WNL  Sleep:        Briant Cedar 04/02/2021, 8:58 AM

## 2021-04-02 NOTE — ED Notes (Signed)
MD paged and asked for a safety sitter

## 2021-04-02 NOTE — ED Notes (Signed)
Pt ambulatory with 1 assist to the bathroom to have BM

## 2021-04-02 NOTE — ED Provider Notes (Signed)
Emergency Department Provider Note   I have reviewed the triage vital signs and the nursing notes.   HISTORY  Chief Complaint Seizures   HPI Angela Medina is a 70 y.o. female past medical history reviewed below returns to the emergency department was then 12 hours after recent discharge.  She was admitted yesterday with shortness of breath and cough.  CTA showed no pulmonary embolism but showed a multifocal pneumonia.  Her COVID was negative.  She was on oxygen and admitted for antibiotics.  Patient was discharged early this morning.  She is homeless.  She apparently was in a sleeping bag outside of a bookstore when the fire department was called after a bystander witnessed approximately 1 minute of seizure activity.  Patient does not recall having a seizure and denies any prior history of seizure.  She tells me that she was in the sleeping bag and had been feeling short of breath and very weak.  She wanted to get up and leave but could not.  She does think that she had some urine incontinence.  By the time EMS arrived on scene the patient was alert and oriented x 4.  Patient tells me that he did get her antibiotics and her new psychiatry medications filled.   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 No pertinent family history.  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 ENT: No sore throat. Cardiovascular: Denies chest pain. Respiratory: Positive shortness of breath and cough.  Gastrointestinal: No abdominal pain.  No nausea, no vomiting.  No diarrhea.  No constipation. Genitourinary: Negative for dysuria. Musculoskeletal: Negative for back pain. Skin: Negative for rash. Neurological: Negative for headaches, focal weakness or numbness. Report from bystander of seizure activity.   10-point ROS otherwise negative.  ____________________________________________   PHYSICAL EXAM:  VITAL SIGNS: Temp: 98.3 F HR: 87 Pulse: 89 BP: 146/99 SpO2 100% on 3L [place]   Constitutional: Alert and oriented.  Patient appears older than stated age.  She is disheveled.  Eyes: Conjunctivae are normal.  Pupil asymmetry noted with 4 mm pupil on the right and pinpoint on the left.  Head: Atraumatic. Nose: No congestion/rhinnorhea. Mouth/Throat: Mucous membranes are dry.  Neck: No stridor.   Cardiovascular: Normal rate, regular rhythm. Good peripheral circulation. Grossly normal heart sounds.   Respiratory: Increased respiratory effort.  No retractions. Lungs diminished throughout with end-expiratory wheezing.  Gastrointestinal: Soft and nontender. No distention.  Musculoskeletal: No lower extremity tenderness nor edema. No gross deformities of extremities. Neurologic:  Normal speech and language. No gross focal neurologic deficits are appreciated.  Skin:  Skin is warm, dry  and intact. No rash noted.  ____________________________________________   LABS (all labs ordered are listed, but only abnormal results are displayed)  Labs Reviewed  COMPREHENSIVE METABOLIC PANEL - Abnormal; Notable for the following components:      Result Value   Glucose, Bld 107 (*)    BUN 24 (*)    Calcium 8.3 (*)     Total Protein 6.3 (*)    Albumin 2.8 (*)    AST 85 (*)    ALT 50 (*)    Alkaline Phosphatase 128 (*)    All other components within normal limits  BRAIN NATRIURETIC PEPTIDE - Abnormal; Notable for the following components:   B Natriuretic Peptide 270.9 (*)    All other components within normal limits  CBC WITH DIFFERENTIAL/PLATELET - Abnormal; Notable for the following components:   WBC 16.4 (*)    RBC 3.68 (*)    Hemoglobin 11.9 (*)    MCV 103.0 (*)    Neutro Abs 13.7 (*)    Monocytes Absolute 1.4 (*)    Abs Immature Granulocytes 0.13 (*)    All other components within normal limits  URINALYSIS, ROUTINE W REFLEX MICROSCOPIC - Abnormal; Notable for the following components:   Specific Gravity, Urine <1.005 (*)    Glucose, UA 100 (*)    Hgb urine dipstick LARGE (*)    All other components within normal limits  URINALYSIS, MICROSCOPIC (REFLEX) - Abnormal; Notable for the following components:   Bacteria, UA MANY (*)    All other components within normal limits  CBC WITH DIFFERENTIAL/PLATELET - Abnormal; Notable for the following components:   WBC 11.8 (*)    RBC 3.31 (*)    Hemoglobin 10.5 (*)    HCT 34.5 (*)    MCV 104.2 (*)    Neutro Abs 8.8 (*)    Monocytes Absolute 1.2 (*)    All other components within normal limits  BASIC METABOLIC PANEL - Abnormal; Notable for the following components:   Calcium 8.1 (*)    All other components within normal limits  CBC - Abnormal; Notable for the following components:   RBC 3.80 (*)    MCV 101.6 (*)    Platelets 413 (*)    All other components within normal limits  COMPREHENSIVE METABOLIC PANEL - Abnormal; Notable for the following components:   Potassium 3.4 (*)    Chloride 97 (*)    CO2 36 (*)    Glucose, Bld 107 (*)    Calcium 8.2 (*)    Total Protein 6.1 (*)    Albumin 2.5 (*)    AST 55 (*)    ALT 70 (*)    Alkaline Phosphatase 171 (*)    All other components within normal limits  CBC - Abnormal; Notable for the  following components:   Platelets 418 (*)    All other components within normal limits  COMPREHENSIVE METABOLIC PANEL - Abnormal; Notable for the following components:   Chloride 95 (*)    CO2 34 (*)    Calcium 8.4 (*)    Total Protein 5.9 (*)    Albumin 2.5 (*)    ALT 50 (*)    Alkaline Phosphatase 147 (*)    All other components within normal limits  I-STAT VENOUS BLOOD GAS, ED - Abnormal; Notable for the following components:   pCO2, Ven 62.5 (*)    Bicarbonate 34.0 (*)    TCO2 36 (*)    Acid-Base Excess 6.0 (*)    Calcium, Ion 1.13 (*)  All other components within normal limits  TROPONIN I (HIGH SENSITIVITY) - Abnormal; Notable for the following components:   Troponin I (High Sensitivity) 76 (*)    All other components within normal limits  TROPONIN I (HIGH SENSITIVITY) - Abnormal; Notable for the following components:   Troponin I (High Sensitivity) 123 (*)    All other components within normal limits  RESP PANEL BY RT-PCR (FLU A&B, COVID) ARPGX2  RAPID URINE DRUG SCREEN, HOSP PERFORMED   ____________________________________________  EKG   EKG Interpretation  Date/Time:  Monday April 02 2021 18:12:56 EST Ventricular Rate:  102 PR Interval:  158 QRS Duration: 93 QT Interval:  341 QTC Calculation: 396 R Axis:   5 Text Interpretation: Sinus tachycardia Atrial premature complexes LVH with secondary repolarization abnormality When compared with ECG of 04/01/2021, No significant change was found Confirmed by Dione Booze ([id number]) on 04/02/2021 11:27:17 PM        ____________________________________________  RADIOLOGY   CXR along with CT head and CT c spine reviewed.  ____________________________________________   PROCEDURES  Procedure(s) performed:   Procedures  CRITICAL CARE Performed by: Maia Plan Total critical care time: 35 minutes Critical care time was exclusive of separately billable procedures and treating other patients. Critical care  was necessary to treat or prevent imminent or life-threatening deterioration. Critical care was time spent personally by me on the following activities: development of treatment plan with patient and/or surrogate as well as nursing, discussions with consultants, evaluation of patient's response to treatment, examination of patient, obtaining history from patient or surrogate, ordering and performing treatments and interventions, ordering and review of laboratory studies, ordering and review of radiographic studies, pulse oximetry and re-evaluation of patient's condition.  Alona Bene, MD Emergency Medicine  ____________________________________________   INITIAL IMPRESSION / ASSESSMENT AND PLAN / ED COURSE  Pertinent labs & imaging results that were available during my care of the patient were reviewed by me and considered in my medical decision making (see chart for details).   Patient presents emergency department with question of seizure activity reported by bystander.  Patient recalls feeling very weak and had some urine incontinence.  History is spotty but no clear stigmata of seizure on exam.  I do note some pupillary asymmetry on exam which does not appear to have been documented previously.  There is no outward sign of head trauma.  Patient otherwise is neuro intact.  Plan for CT imaging of the head along with cervical spine and will repeat lab work along with VBG and chest x-ray.  Will repeat COVID and flu.  Patient is currently on 3 L nasal cannula oxygen which was placed by EMS with oxygen saturation on the scene in the low 90s with tachypnea.   Attempted to wean O2 in the ED with hypoxemia to 86% on RA. Placed back on 3L with improvement. No acute findings on CT head or c-spine. COVID and Flu are negative. Mild troponin elevation likely 2/2 demand. No ischemic change on EKG. Plan to trend.   Discussed patient's case with IM teaching to request admission. Patient and family (if present)  updated with plan. Care transferred to IM teaching service.  I reviewed all nursing notes, vitals, pertinent old records, EKGs, labs, imaging (as available).  ____________________________________________  FINAL CLINICAL IMPRESSION(S) / ED DIAGNOSES  Final diagnoses:  Acute on chronic respiratory failure with hypoxia (HCC)  Community acquired pneumonia, unspecified laterality  Seizure-like activity (HCC)  Hypoxia     MEDICATIONS GIVEN DURING THIS VISIT:  Medications  ipratropium-albuterol (DUONEB) 0.5-2.5 (3) MG/3ML nebulizer solution 3 mL (3 mLs Nebulization Given 04/03/21 0526)  OLANZapine (ZYPREXA) tablet 2.5 mg (2.5 mg Oral Given 04/05/21 0209)  OLANZapine (ZYPREXA) tablet 5 mg (5 mg Oral Given 04/04/21 2016)  enoxaparin (LOVENOX) injection 40 mg (40 mg Subcutaneous Given 04/04/21 2016)  acetaminophen (TYLENOL) tablet 650 mg (has no administration in time range)    Or  acetaminophen (TYLENOL) suppository 650 mg (has no administration in time range)  nicotine (NICODERM CQ - dosed in mg/24 hours) patch 14 mg (14 mg Transdermal Patient Refused/Not Given 04/05/21 1008)  cefdinir (OMNICEF) capsule 300 mg (300 mg Oral Given 04/05/21 1007)  ramelteon (ROZEREM) tablet 8 mg (has no administration in time range)  sodium chloride 0.9 % bolus 500 mL (0 mLs Intravenous Stopped 04/02/21 1901)  ipratropium-albuterol (DUONEB) 0.5-2.5 (3) MG/3ML nebulizer solution 3 mL (3 mLs Nebulization Given 04/02/21 1841)  cefTRIAXone (ROCEPHIN) 1 g in sodium chloride 0.9 % 100 mL IVPB (0 g Intravenous Stopped 04/02/21 2122)  azithromycin (ZITHROMAX) 500 mg in sodium chloride 0.9 % 250 mL IVPB (0 mg Intravenous Stopped 04/02/21 2244)  azithromycin (ZITHROMAX) tablet 500 mg (500 mg Oral Given 04/03/21 0941)  potassium chloride SA (KLOR-CON M) CR tablet 40 mEq (40 mEq Oral Given 04/04/21 1016)    Note:  This document was prepared using Dragon voice recognition software and may include unintentional dictation  errors.  Alona Bene, MD, [facility] Emergency Medicine    Carianna Lague, Arlyss Repress, MD 04/05/21 1452

## 2021-04-02 NOTE — Plan of Care (Signed)
Paged by RN this morning for patient's request to leave AMA.  I evaluated the patient at bedside. She remained agitated this morning, although slightly improved from yesterday. She notes that she wishes to leave due to not being treated right here and is still noting issues with the food being contaminated with something that will make her sick. I explained to her that we would like her to remain in the hospital for treatment of pneumonia and to help with her mental health issues. She said she is leaving. She declined haldol initially however, after bringing in security, was ultimately willing to allow RN to administer 4mg  IM haldol, without force.   Plan -IVC due to risk for harm of self and others in the setting of paranoid delusions and acute psychosis -1:1 safety sitter ordered however unable to find staff -continue IM or IV haldol -appreciate psychiatry evaluation today  , MD Internal Medicine Resident PGY-3 [facility] Pager: [phone] 04/02/2021 10:10 AM

## 2021-04-02 NOTE — H&P (Signed)
Date: 04/02/2021               Patient Name:  Angela Medina MRN: [id number]  DOB: 08/22/1950 Age / Sex: 70 y.o., female   PCP: Patient, No Pcp Per (Inactive)         Medical Service: Internal Medicine Teaching Service         Attending Physician: Dr. Evette Doffing, Mallie Mussel, *    First Contact: Dr. Elliot Gurney Pager: [phone]  Second Contact: Dr. Lisabeth Devoid Pager: [phone]       After Hours (After 5p/  First Contact Pager: [phone]  weekends / holidays): Second Contact Pager: [phone]   Chief Complaint: Possible seizure   History of Present Illness: Patient states she recalls sleeping on the streets in her sleeping bag outside of a retail store today after she was discharged. She felt really cold and remembers her body shaking. She recalls EMS arriving the scene and a crowd of people surrounding her. Endorses urinary and bowel incontinence but attributes this to not being able to find a bathroom in time.  Denies any history of seizures.  Denies any chest pain, changes in her vision, headaches or weakness.  Endorses shortness of breath and cough.  Reports visual and auditory hallucinations but not actively.  She states she has not taken her new medications that were prescribed at discharge following her recent hospitalization.   Meds:  Current Meds  Medication Sig   albuterol (VENTOLIN HFA) 108 (90 Base) MCG/ACT inhaler Inhale 2 puffs into the lungs every 6 (six) hours as needed for wheezing or shortness of breath.   azithromycin (ZITHROMAX) 500 MG tablet Take 1 tablet (500 mg total) by mouth daily.   cefdinir (OMNICEF) 300 MG capsule Take 1 capsule (300 mg total) by mouth 2 (two) times daily.   ibuprofen (ADVIL) 200 MG tablet Take 200 mg by mouth every 6 (six) hours as needed for mild pain or moderate pain.   OLANZapine (ZYPREXA) 2.5 MG tablet Take 1 tablet (2.5 mg total) by mouth 2 (two) times daily as needed (agitation for SI and HI).   OLANZapine (ZYPREXA) 5 MG tablet Take 1 tablet (5 mg  total) by mouth at bedtime.     Allergies: Allergies as of 04/02/2021 - Review Complete 04/02/2021  Allergen Reaction Noted   Penicillins Other (See Comments) 09/30/2019   Past Medical History:  Diagnosis Date   Aneurysm ([place])    Depression    Schizoaffective disorder ([place])    Vision changes    right eye    Family History: History reviewed. No pertinent family history.   Social History: Patient is currently un-homed.  Last reported on a bench near retail stores sleeping.  Patient does not have any immediate contact.  Patient has underlying psychiatric disorders.  Unknown drug use status.     Review of Systems: A complete ROS was negative except as per HPI.   Physical Exam: Blood pressure 116/86, pulse 89, temperature 98.3 F (36.8 C), temperature source Temporal, resp. rate (!) 28, SpO2 97 %. Physical Exam Constitutional:      General: She is awake.     Comments: Appeared disheveled   HENT:     Head: Normocephalic and atraumatic.  Cardiovascular:     Rate and Rhythm: Normal rate and regular rhythm.     Heart sounds: Normal heart sounds.  Pulmonary:     Breath sounds: Wheezing (expiratory) present. No decreased breath sounds.  Musculoskeletal:     Right lower leg: No  edema.     Left lower leg: No edema.  Skin:    General: Skin is warm and dry.     Comments: Dirt collection under finger nails; poor hygiene with dirt stains on skin  Neurological:     General: No focal deficit present.     Mental Status: She is oriented to person, place, and time.  Psychiatric:        Speech: Speech is slurred.        Behavior: Behavior is cooperative.        Thought Content: Thought content is not paranoid or delusional.     Comments: Pt endorse AH/VH but not actively     EKG: personally reviewed my interpretation is pending  DG Chest 2 View  Result Date: 03/31/2021 CLINICAL DATA:  Cough, shortness of breath. EXAM: CHEST - 2 VIEW COMPARISON:  03/14/2021. FINDINGS: The heart  size and mediastinal contours are stable. Mild atherosclerotic calcification of the aorta is noted. Interstitial prominence is noted in the lungs bilaterally and unchanged from the prior exam and may be chronic. No consolidation, effusion, or pneumothorax. Stable compression deformities and kyphosis are noted in the midthoracic spine. No acute osseous abnormality. IMPRESSION: Cardiomegaly with no acute cardiopulmonary process. Electronically Signed   By: Brett Fairy M.D.   On: 03/31/2021 23:46   DG Chest 2 View  Result Date: 03/14/2021 CLINICAL DATA:  Chest pain cough EXAM: CHEST - 2 VIEW COMPARISON:  None. FINDINGS: The heart size and mediastinal contours are within normal limits. Both lungs are clear. Kyphosis of the midthoracic spine with moderate severe age indeterminate compression fractures IMPRESSION: No active cardiopulmonary disease. Age indeterminate moderate severe compression fractures of the midthoracic spine with kyphosis Electronically Signed   By: Donavan Foil M.D.   On: 03/14/2021 20:53   CT Head Wo Contrast  Result Date: 04/02/2021 CLINICAL DATA:  Trauma EXAM: CT HEAD WITHOUT CONTRAST CT CERVICAL SPINE WITHOUT CONTRAST TECHNIQUE: Multidetector CT imaging of the head and cervical spine was performed following the standard protocol without intravenous contrast. Multiplanar CT image reconstructions of the cervical spine were also generated. COMPARISON:  CT dated September 30, 2019 FINDINGS: CT HEAD FINDINGS Brain: Chronic white matter ischemic change. No evidence of acute infarction, hemorrhage, hydrocephalus, extra-axial collection or mass lesion/mass effect. Vascular: Coil pack noted near the expected area of the right internal carotid artery. Skull: Evaluation is somewhat limited due to motion artifact, within limitations there is no evidence of acute fracture. Sinuses/Orbits: No acute finding. Other: None. CT CERVICAL SPINE FINDINGS Considerable exam limitations due to motion artifact.  Alignment: Anterolisthesis of C3 on C4 and C4 on C5, likely degenerative given presence of associated severe facet arthropathy. Skull base and vertebrae: No definite acute fracture. No primary bone lesion or focal pathologic process. Soft tissues and spinal canal: No prevertebral fluid or swelling. No visible canal hematoma. Disc levels: Severe multilevel degenerative disc disease, most pronounced in the lower lumbar spine. Upper chest: Negative. Other: None. IMPRESSION: 1. No acute intracranial abnormality. 2. No definite evidence of acute skull or cervical spine fracture, although there are considerable exam limitations due to motion artifact. Repeat imaging should be considered when patient is able to cooperate. 3. Anterolisthesis of C3 on C4 and C4 on C5, likely degenerative given presence of associated severe facet arthropathy. Electronically Signed   By: Yetta Glassman M.D.   On: 04/02/2021 19:00   CT Angio Chest PE W and/or Wo Contrast  Result Date: 04/01/2021 CLINICAL DATA:  70 year old female  with shortness of breath and altered mental status. EXAM: CT ANGIOGRAPHY CHEST WITH CONTRAST TECHNIQUE: Multidetector CT imaging of the chest was performed using the standard protocol during bolus administration of intravenous contrast. Multiplanar CT image reconstructions and MIPs were obtained to evaluate the vascular anatomy. CONTRAST:  57mL OMNIPAQUE IOHEXOL 350 MG/ML SOLN COMPARISON:  Chest radiographs 03/31/2021. FINDINGS: Cardiovascular: Good contrast bolus timing in the pulmonary arterial tree. No focal filling defect identified in the pulmonary arteries to suggest acute pulmonary embolism. Highly tortuous thoracic aorta with Calcified aortic atherosclerosis. No aortic dissection or saccular aneurysm. Tortuous proximal great vessels. Mild cardiomegaly. No pericardial effusion. Mediastinum/Nodes: No mediastinal mass or lymphadenopathy. Lungs/Pleura: Major airways are patent but there is widespread nodular  and confluent peribronchial opacity in the posterior basal segment of the left lower lobe, associated with some segmental or subsegmental airway opacification there. Generalized central bronchial wall thickening elsewhere. Early similar patchy and nodular peribronchial opacity in the right lower lobe and lateral segment of the right middle lobe, with some consolidation in the latter. Early tree-in-bud nodular opacities in the right upper lobe series 6, image 38. Left upper lobe spared. Mild atelectasis suspected in the lingula. No pleural effusion. Upper Abdomen: Negative visible liver, spleen, pancreas, kidneys and bowel in the upper abdomen. Thickening of the adrenal glands such as due to adrenal hyperplasia. Musculoskeletal: T8 vertebra plana with associated chronic ankylosis with the T7 and T9 vertebral bodies. Subsequent focal thoracic kyphosis. Very severe upper lumbar disc, endplate, and facet degeneration partially visible. Chronic appearing right lateral 8 and/or 9th rib fractures partially visible. More acute to subacute appearing anterior left 2nd rib fracture series 6, image 35 with similar anterior 3rd through 5th rib fractures. Faint periosteal reaction at the 4th rib. No other No acute osseous abnormality identified. Review of the MIP images confirms the above findings. IMPRESSION: 1. Negative for acute pulmonary embolus. 2. Bilateral bronchopneumonia, most confluent in the left lower lobe. But right middle and lower lobe involvement, early right upper lobe involvement. No pleural effusion. 3. Acute to subacute appearing left 2nd through 5th anterior rib fractures. 4. 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 5. Highly tortuous aorta with Aortic atherosclerosis (ICD10-I70.0). Electronically Signed   By: Genevie Ann M.D.   On: 04/01/2021 07:48   CT Cervical Spine Wo Contrast  Result Date: 04/02/2021 CLINICAL DATA:  Trauma EXAM: CT HEAD WITHOUT CONTRAST CT CERVICAL SPINE WITHOUT CONTRAST  TECHNIQUE: Multidetector CT imaging of the head and cervical spine was performed following the standard protocol without intravenous contrast. Multiplanar CT image reconstructions of the cervical spine were also generated. COMPARISON:  CT dated September 30, 2019 FINDINGS: CT HEAD FINDINGS Brain: Chronic white matter ischemic change. No evidence of acute infarction, hemorrhage, hydrocephalus, extra-axial collection or mass lesion/mass effect. Vascular: Coil pack noted near the expected area of the right internal carotid artery. Skull: Evaluation is somewhat limited due to motion artifact, within limitations there is no evidence of acute fracture. Sinuses/Orbits: No acute finding. Other: None. CT CERVICAL SPINE FINDINGS Considerable exam limitations due to motion artifact. Alignment: Anterolisthesis of C3 on C4 and C4 on C5, likely degenerative given presence of associated severe facet arthropathy. Skull base and vertebrae: No definite acute fracture. No primary bone lesion or focal pathologic process. Soft tissues and spinal canal: No prevertebral fluid or swelling. No visible canal hematoma. Disc levels: Severe multilevel degenerative disc disease, most pronounced in the lower lumbar spine. Upper chest: Negative. Other: None. IMPRESSION: 1. No acute intracranial abnormality.  2. No definite evidence of acute skull or cervical spine fracture, although there are considerable exam limitations due to motion artifact. Repeat imaging should be considered when patient is able to cooperate. 3. Anterolisthesis of C3 on C4 and C4 on C5, likely degenerative given presence of associated severe facet arthropathy. Electronically Signed   By: Yetta Glassman M.D.   On: 04/02/2021 19:00   DG CHEST PORT 1 VIEW  Result Date: 04/02/2021 CLINICAL DATA:  Agitated EXAM: PORTABLE CHEST 1 VIEW COMPARISON:  03/31/2021 FINDINGS: Mild cardiomegaly. Linear atelectasis at the left base. No consolidation, pleural effusion or pneumothorax.  Aortic atherosclerosis. IMPRESSION: Cardiomegaly with subsegmental atelectasis at the left base Electronically Signed   By: Donavan Foil M.D.   On: 04/02/2021 22:11   DG Foot Complete Right  Result Date: 03/14/2021 CLINICAL DATA:  Toe pain EXAM: RIGHT FOOT COMPLETE - 3+ VIEW COMPARISON:  None. FINDINGS: Marked hallux valgus deformity at the first MTP joint with large bunion. Moderate arthritis at the first MTP joint. Fracture deformity at the mid to distal fourth proximal phalanx with some periosteal new bone formation suggesting subacute process. There may be in a more acute appearing fracture at the base of the fourth proximal phalanx on the lateral side. IMPRESSION: 1. Subacute to chronic appearing fracture deformity involving fourth proximal phalanx with possible more acute appearing fracture at the base of the proximal phalanx 2. Marked hallux valgus deformity at the first MTP joint with associated bunion formation and degenerative change Electronically Signed   By: Donavan Foil M.D.   On: 03/14/2021 20:55     Assessment & Plan by Problem: Principal Problem:   Acute hypoxemic respiratory failure (HCC) Active Problems:   Bronchopneumonia   Schizoaffective disorder ([place])   #Possible seizure #Hematuria Patient recently discharged early afternoon of 04/02/2021.  Patient is currently un-homed.  Appears disheveled on exam.  Patient was sent home on new medications including olanzapine, cefdinir, azithromycin, and albuterol. Pt endorse she has not taken her new medications yet. Low risk for medication adverse effect.  Patient was brought in by ambulance following an alleged witnessed seizure from a bystander.  CT head and cervical spine negative however exam was limited due to motion artifact. Patient reports bowel/bladder incontinence due to not finding a restroom near by; denies tongue biting. No hx of seizures. Patient is A&Ox4 and able to recall recent events. Low suspicion for postictal state.  Patient endorse shaking due to cold weather, she was sleeping outside bench of department store.  UA significant for hemoglobin and many bacteria.  Muscle breakdown in the setting of possible seizure could explain the blood in urine.  Consider checking for myoglobin in the urine however, may not be beneficial or contribute to current management. Furthermore, CMP insignificant, no AKI noted.  Patient is afebrile and vitals are stable.  UDS negative.  Consider checking CK levels however in the absence of acute kidney injury and patient able to recall recent events, may not be contributory.  Patient receives 500 mL bolus sodium chloride in the ED. Patient appears euvolemic on exam, additional fluids not indicated at this time; patient is able to eat and drink. As of now, patient is stable, able to recount events and shows no sign of neurological deficit. If mentation deteriorates, consider consult to Neurology.  --Trend CBC --Trend BMP --Follow-up Etoh levels --Diet: regular --Seizure precaution --repeat UA once patient is out of acute illness --Continue cardiac monitoring  Acute hypoxemic respiratory failure 2/2 Community-acquired pneumonia Patient recently hospitalized  03/31/2021 to 04/02/2021 for acute hypoxic respiratory failure secondary to CAP. Patient was started on IV azithromycin and Rocephin during hospitalization.  And was discharged on oral azithromycin and cefdinir.  During interview, patient desatted into the upper 80s and increased work of breathing noted on exam.  Patient placed on 2 L nasal cannula with improvement in oxygenation.  Patient received 1 dose of IV azithromycin and ceftriaxone in the ED. Chest x-ray significant for cardiomegaly with atelectasis in left lung base improved from prior cxr 03/31/21. --Continue oral azithromycin and cefdinir tomorrow --O2 supplementation as needed; keep oxygen saturation >92% --DuoNeb 3 mL every 6 hours as needed --Continue to monitor O2  saturation  Schizoaffective disorder Patient recently hospitalized 03/31/2021 to 04/02/2021 presented with active hallucinations.  Patient was initially treated with Haldol with some improvement in her mental status.  Psychiatry evaluated patient and felt she was safe for discharge.  Patient was discharged on Zyprexa 5 mg at bedtime and Zyprexa 2.5 twice daily as needed for agitation. -- Continue Zyprexa 5 mg at bedtime and Zyprexa 2.5 twice daily as needed for agitation    Dispo: Admit patient to Observation with expected length of stay less than 2 midnights.  Signed: Dellis Filbert, MD 04/02/2021, 10:22 PM  Pager: [phone] After 5pm on weekdays and 1pm on weekends: On Call pager: [phone]

## 2021-04-02 NOTE — ED Notes (Signed)
Pt had removed all cardiac wires and was attempting toclimb out of bed. Pt assisted to a chair, bed linens changed, purewick and brief changed.Assisted pt back to bed, given orange juice and milk per request.

## 2021-04-02 NOTE — ED Notes (Signed)
Order placed for safety sitter - per staffing there are no sitters to sit with patient

## 2021-04-02 NOTE — ED Notes (Signed)
Pt continues to climb out of bed to get into chair. Instructed many times to use call light to assist getting to chair. Pt also requesting night time zyprexa

## 2021-04-02 NOTE — ED Notes (Signed)
Pt placed on 2L [place]

## 2021-04-02 NOTE — ED Notes (Signed)
Pt ambulated back to room at this time - gown placed on pt - pt sitting in chair at bedside - pt placed back on monitor at this time

## 2021-04-02 NOTE — ED Notes (Signed)
MD Ephriam Knuckles given IVC paperwork to fill out at this time

## 2021-04-02 NOTE — ED Notes (Signed)
Pt cont to get up and down out of bed - pt educated on use and importance of call bell - pt refusing to wear non skid socks - pt refusing haldol and cough medicine at this time

## 2021-04-02 NOTE — ED Notes (Signed)
Pt asked this RN if she could have her haldol now - this RN went in to give pt haldol and pt again refusing saying she wants Ambien, pt explained as to why Ambien cannot be given at this time - Pt still refusing to take Haldol - pt is calm at this time

## 2021-04-02 NOTE — ED Notes (Signed)
Cardiac leads not on pt at this time due to pt agitation and pulling at cords.

## 2021-04-02 NOTE — ED Notes (Signed)
Pt again found out of bed standing on the side - pt asking to go smoke again - pt educated this is not possible - pt redirected back to bed - placed back on monitor - pt continues to take O2 off - O2 put back on pt - pt asking for snack, given snack - pt resting back in bed at this time and educated on use of call bell if she needs to get up

## 2021-04-02 NOTE — Discharge Instructions (Signed)
You were admitted to the hospital for pneumonia. Please continue antibiotics, azithromycin and cefdinir, for treatment of pneumonia.  Please be sure to follow up in a clinic to make sure your pneumonia is better. [facility] for ongoing management of schizoaffective disorder. The Zyprexa will help with your symptoms.    Get Medicines reviewed and adjusted: Please take all your medications with you for your next visit with your Primary MD   Laboratory/radiological data: Please request your Primary MD to go over all hospital tests and procedure/radiological results at the follow up, please ask your Primary MD to get all Hospital records sent to his/her office.   In some cases, they will be blood work, cultures and biopsy results pending at the time of your discharge. Please request that your primary care M.D. goes through all the records of your hospital data and follows up on these results.   Also Note the following: If you experience worsening of your admission symptoms, develop shortness of breath, life threatening emergency, suicidal or homicidal thoughts you must seek medical attention immediately by calling 911 or calling your MD immediately  if symptoms less severe.   You must read complete instructions/literature along with all the possible adverse reactions/side effects for all the Medicines you take and that have been prescribed to you. Take any new Medicines after you have completely understood and accpet all the possible adverse reactions/side effects.    Do not drive when taking Pain medications or sleeping medications (Benzodaizepines)   Do not take more than prescribed Pain, Sleep and Anxiety Medications. It is not advisable to combine anxiety,sleep and pain medications without talking with your primary care practitioner   Special Instructions: If you have smoked or chewed Tobacco  in the last 2 yrs please stop smoking, stop any regular  Alcohol  and or any Recreational drug use.   Wear Seat belts while driving.   Please note: You were cared for by a hospitalist during your hospital stay. Once you are discharged, your primary care physician will handle any further medical issues. Please note that NO REFILLS for any discharge medications will be authorized once you are discharged, as it is imperative that you return to your primary care physician (or establish a relationship with a primary care physician if you do not have one) for your post hospital discharge needs so that they can reassess your need for medications and monitor your lab values.

## 2021-04-02 NOTE — ED Triage Notes (Signed)
Pt BIB [facility] d/t a witnessed seizure that was reported from a bystander while pt reports to have been sleeping outside of the bookstore. EMS reports wheezing in RUL, ST with PVC's & PAC's, a productive cough & a plastic bag wrapped on her Right foot. 176/116 118 bpm 26 resp 93% RA 185 CBG

## 2021-04-02 NOTE — ED Notes (Signed)
Pt removed all monitoring wires, and attempted to crawl out of bed again. Assisted to restroom by wheelchair

## 2021-04-02 NOTE — ED Notes (Signed)
Pt removed monitoring wires, assisted to chair, cardiac monitor replaced

## 2021-04-02 NOTE — ED Notes (Addendum)
Called to room by patient. When this nurse entered the room it smelled strongly of cigarettes. Pt has a lighter and a pack of cigarettes in her hand. This Clinical research associate is very suspicious that pt just smoked a cigarette in the room. Cigarettes confiscated and added to pt belongings at this time.

## 2021-04-02 NOTE — Progress Notes (Addendum)
RNCM met with pt. at bedside.Pt. does not a perm residence therefore [place] will be the best place to receive outpatient care. Pt. Verbalized understanding of contacting [facility]. RNCM presented meds to the assigned RN and pt. Is ready for d/c.

## 2021-04-02 NOTE — ED Notes (Signed)
Pt only compliant to try incentive spirometer once - pt educated on use and importance - left at bedside for patient

## 2021-04-02 NOTE — ED Notes (Addendum)
IVC paperwork was rejected. Messaged Dr. Ephriam Knuckles

## 2021-04-02 NOTE — ED Notes (Signed)
Pt refusing cough medicine at this time

## 2021-04-02 NOTE — ED Notes (Signed)
Patient transported to CT 

## 2021-04-02 NOTE — Discharge Summary (Signed)
Name: Angela Medina MRN: [id number] DOB: Aug 31, 1950 70 y.o. PCP: Patient, No Pcp Per (Inactive)  Date of Admission: 03/31/2021 10:16 PM Date of Discharge: 04/02/2021 Attending Physician: Dr. 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   azithromycin 500 MG tablet Commonly known as: Zithromax Take 1 tablet (500 mg total) by mouth daily.   cefdinir 300 MG capsule Commonly known as: OMNICEF Take 1 capsule (300 mg total) by mouth 2 (two) times daily.   OLANZapine 5 MG tablet Commonly known as: ZYPREXA Take 1 tablet (5 mg total) by mouth at bedtime.   OLANZapine 2.5 MG tablet Commonly known as: ZYPREXA Take 1 tablet (2.5 mg total) by mouth 2 (two) times daily as needed (agitation for SI and HI).        Disposition and follow-up:   Angela Medina was discharged from [facility] in Stable condition.  At the hospital follow up visit please address:  1.  Follow-up:  a. Community acquired pneumonia - patient is unhoused, likely difficult follow up, ensure no recurrence of symptoms    b. Schizoaffective disorder - unhoused, ensure patient is able to fill prescriptions   c.   d.  2.  Labs / imaging needed at time of follow-up: cmp, cbc  3.  Pending labs/ test needing follow-up: none  4.  Medication Changes  Started: Zyprexa 5mg  QHS, Zyprexa Zydis 2.5mg   Stopped: none  Changed: none  Abx -  Zithromax 500mg , cefdinir 300mg  End Date: 04/07/21  Follow-up Appointments:  Follow-up  Information     [place]. Schedule an appointment as soon as possible for a visit in 2 days.   Contact information: [place] [phone] [place] Follow up in 1 week(s).   Specialty: Urgent Care Contact information: [place] Hospital Course by problem list:  Bronchopneumonia Patient was evaluated in the emergency department and noted to have acute hypoxic respiratory failure secondary to community acquired pneumonia. She was started on azithromycin and rocephin. Patient has a history of schizoaffective disorder and was actively hallucinating while roomed in the ED. Patient voiced concerns that her food was being poisoned and that [place] were outside her room. Patient stated she was leaving, however due to her active hallucinations, psychiatry was asked to evaluate, however was deemed not an appropriate candidate for IVC. Transitions of care was consulted to ensure she was able to receive her medications. She was given oral antibiotics with instructions for close follow up and discharged.    Schizoaffective disorder Patient presented with active hallucinations including assassins present outside of her room. She was initially treated with Haldol with some improvement in her mental status. Patient voiced the desire to leave prior to completing treatment for her pneumonia, psychiatry was asked to evaluate  the patient, and she was felt safe for discharge. Her Haldol was stopped and she was started on Zyprexa 5mg  and Zyprexa Zydis and discharged.  No notes on file   Discharge Subjective: Patient reports that she is feeling better. Still coughing. Eating breakfast. Declined physical exam.  Discharge Exam:   BP 120/67   Pulse 82   Temp 98.7 F (37.1 C) (Oral)   Resp 14   SpO2 100%   Constitutional: disheveled appearing female, in no acute distress HENT: unable to assess Eyes: conjunctiva non-erythematous Neck: supple Cardiovascular: unable to assess Pulmonary/Chest: unable to assess Abdominal: unable to assess MSK: normal bulk and tone Neurological: alert, no focal deficits Skin: intact and dry Psych: delusions and paranoid, irritable mood   Pertinent Labs, Studies, and Procedures:  CBC Latest Ref Rng & Units 04/02/2021 04/02/2021 04/01/2021  WBC 4.0 - 10.5 K/uL - 15.5(H) -  Hemoglobin 12.0 - 15.0 g/dL 12.2 11.4(L) 11.9(L)  Hematocrit 36.0 - 46.0 % 36.0 36.0 35.0(L)  Platelets 150 - 400 K/uL - 343 -    CMP Latest Ref Rng & Units 04/02/2021 04/02/2021 04/01/2021  Glucose 70 - 99 mg/dL - 117(H) -  BUN 8 - 23 mg/dL - 27(H) -  Creatinine 0.44 - 1.00 mg/dL - 0.73 -  Sodium 135 - 145 mmol/L 139 136 132(L)  Potassium 3.5 - 5.1 mmol/L 3.9 4.7 3.7  Chloride 98 - 111 mmol/L - 98 -  CO2 22 - 32 mmol/L - 29 -  Calcium 8.9 - 10.3 mg/dL - 8.3(L) -  Total Protein 6.5 - 8.1 g/dL - - -  Total Bilirubin 0.3 - 1.2 mg/dL - - -  Alkaline Phos 38 - 126 U/L - - -  AST 15 - 41 U/L - - -  ALT 0 - 44 U/L - - -    CT Head Wo Contrast  Result Date: 04/02/2021 CLINICAL DATA:  Trauma EXAM: CT HEAD WITHOUT CONTRAST CT CERVICAL SPINE WITHOUT CONTRAST TECHNIQUE: Multidetector CT imaging of the head and cervical spine was performed following the standard protocol without intravenous contrast. Multiplanar CT image reconstructions of the cervical spine were also generated. COMPARISON:  CT dated September 30, 2019 FINDINGS: CT HEAD FINDINGS Brain: Chronic white matter ischemic change. No evidence of acute infarction, hemorrhage, hydrocephalus, extra-axial collection or mass lesion/mass effect. Vascular: Coil pack noted near the expected area of the right internal carotid artery. Skull: Evaluation is somewhat limited due to motion artifact, within limitations there is no evidence of acute  fracture. Sinuses/Orbits: No acute finding. Other: None. CT CERVICAL SPINE FINDINGS Considerable exam limitations due to motion artifact. Alignment: Anterolisthesis of C3 on C4 and C4 on C5, likely degenerative given presence of associated severe facet arthropathy. Skull base and vertebrae: No definite acute fracture. No primary bone lesion or focal pathologic process. Soft tissues and spinal canal: No prevertebral fluid or swelling. No visible canal hematoma. Disc levels: Severe multilevel degenerative disc disease, most pronounced in the lower lumbar spine. Upper chest: Negative. Other: None. IMPRESSION: 1. No acute intracranial abnormality. 2. No definite evidence of acute skull or cervical spine fracture, although there are considerable exam limitations due to motion artifact. Repeat imaging should be considered when patient is able to cooperate. 3. Anterolisthesis of C3 on C4 and C4 on C5, likely degenerative given presence of associated severe facet arthropathy. Electronically Signed   By: Yetta Glassman M.D.   On: 04/02/2021 19:00   CT Cervical Spine Wo Contrast  Result Date: 04/02/2021 CLINICAL DATA:  Trauma  EXAM: CT HEAD WITHOUT CONTRAST CT CERVICAL SPINE WITHOUT CONTRAST TECHNIQUE: Multidetector CT imaging of the head and cervical spine was performed following the standard protocol without intravenous contrast. Multiplanar CT image reconstructions of the cervical spine were also generated. COMPARISON:  CT dated September 30, 2019 FINDINGS: CT HEAD FINDINGS Brain: Chronic white matter ischemic change. No evidence of acute infarction, hemorrhage, hydrocephalus, extra-axial collection or mass lesion/mass effect. Vascular: Coil pack noted near the expected area of the right internal carotid artery. Skull: Evaluation is somewhat limited due to motion artifact, within limitations there is no evidence of acute fracture. Sinuses/Orbits: No acute finding. Other: None. CT CERVICAL SPINE FINDINGS Considerable exam  limitations due to motion artifact. Alignment: Anterolisthesis of C3 on C4 and C4 on C5, likely degenerative given presence of associated severe facet arthropathy. Skull base and vertebrae: No definite acute fracture. No primary bone lesion or focal pathologic process. Soft tissues and spinal canal: No prevertebral fluid or swelling. No visible canal hematoma. Disc levels: Severe multilevel degenerative disc disease, most pronounced in the lower lumbar spine. Upper chest: Negative. Other: None. IMPRESSION: 1. No acute intracranial abnormality. 2. No definite evidence of acute skull or cervical spine fracture, although there are considerable exam limitations due to motion artifact. Repeat imaging should be considered when patient is able to cooperate. 3. Anterolisthesis of C3 on C4 and C4 on C5, likely degenerative given presence of associated severe facet arthropathy. Electronically Signed   By: Allegra Lai M.D.   On: 04/02/2021 19:00     Discharge Instructions: Discharge Instructions     Call MD for:  difficulty breathing, headache or visual disturbances   Complete by: As directed    Call MD for:  extreme fatigue   Complete by: As directed    Call MD for:  hives   Complete by: As directed    Call MD for:  persistant dizziness or light-headedness   Complete by: As directed    Call MD for:  persistant nausea and vomiting   Complete by: As directed    Call MD for:  severe uncontrolled pain   Complete by: As directed    Call MD for:  temperature >100.4   Complete by: As directed      You were admitted to the hospital for pneumonia. Please continue antibiotics, azithromycin and cefdinir, for treatment of pneumonia.  Please be sure to follow up in a clinic to make sure your pneumonia is better. Please follow up in the [facility] for ongoing management of schizoaffective disorder. The Zyprexa will help with your symptoms.   Signed: Adron Bene, MD 04/02/2021, 7:06 PM    Pager: [phone]

## 2021-04-02 NOTE — ED Notes (Signed)
This RN found pt to have put clothes on - this RN asked pt to change back into gown, pt refused - pt stating she needed to walk to bathroom and check her hair - this pt refuses to get back into bed at this time - when this RN tried to help pt ambulate to restroom pt swatted at this RN - pt ambulatory to restroom at this time

## 2021-04-03 DIAGNOSIS — Z20822 Contact with and (suspected) exposure to covid-19: Secondary | ICD-10-CM | POA: Diagnosis present

## 2021-04-03 DIAGNOSIS — F22 Delusional disorders: Secondary | ICD-10-CM | POA: Diagnosis present

## 2021-04-03 DIAGNOSIS — Z880 Allergy status to penicillin: Secondary | ICD-10-CM | POA: Diagnosis not present

## 2021-04-03 DIAGNOSIS — R32 Unspecified urinary incontinence: Secondary | ICD-10-CM | POA: Diagnosis present

## 2021-04-03 DIAGNOSIS — J189 Pneumonia, unspecified organism: Secondary | ICD-10-CM | POA: Diagnosis present

## 2021-04-03 DIAGNOSIS — F17210 Nicotine dependence, cigarettes, uncomplicated: Secondary | ICD-10-CM | POA: Diagnosis present

## 2021-04-03 DIAGNOSIS — Z5900 Homelessness unspecified: Secondary | ICD-10-CM | POA: Diagnosis not present

## 2021-04-03 DIAGNOSIS — F419 Anxiety disorder, unspecified: Secondary | ICD-10-CM | POA: Diagnosis present

## 2021-04-03 DIAGNOSIS — J180 Bronchopneumonia, unspecified organism: Secondary | ICD-10-CM | POA: Diagnosis present

## 2021-04-03 DIAGNOSIS — F259 Schizoaffective disorder, unspecified: Secondary | ICD-10-CM | POA: Diagnosis present

## 2021-04-03 DIAGNOSIS — Z79899 Other long term (current) drug therapy: Secondary | ICD-10-CM | POA: Diagnosis not present

## 2021-04-03 DIAGNOSIS — J9601 Acute respiratory failure with hypoxia: Secondary | ICD-10-CM | POA: Diagnosis present

## 2021-04-03 DIAGNOSIS — R319 Hematuria, unspecified: Secondary | ICD-10-CM | POA: Diagnosis present

## 2021-04-03 DIAGNOSIS — R778 Other specified abnormalities of plasma proteins: Secondary | ICD-10-CM | POA: Diagnosis present

## 2021-04-03 DIAGNOSIS — F32A Depression, unspecified: Secondary | ICD-10-CM | POA: Diagnosis present

## 2021-04-03 LAB — BASIC METABOLIC PANEL
Anion gap: 9 (ref 5–15)
BUN: 21 mg/dL (ref 8–23)
CO2: 27 mmol/L (ref 22–32)
Calcium: 8.1 mg/dL — ABNORMAL LOW (ref 8.9–10.3)
Chloride: 103 mmol/L (ref 98–111)
Creatinine, Ser: 0.5 mg/dL (ref 0.44–1.00)
GFR, Estimated: >60 mL/min (ref >60)
Glucose, Bld: 90 mg/dL (ref 70–99)
Potassium: 4.3 mmol/L (ref 3.5–5.1)
Sodium: 139 mmol/L (ref 135–145)

## 2021-04-03 LAB — CBC WITH DIFFERENTIAL/PLATELET
Abs Immature Granulocytes: 0.05 10*3/uL (ref 0.00–0.07)
Basophils Absolute: 0.1 10*3/uL (ref 0.0–0.1)
Basophils Relative: 1 %
Eosinophils Absolute: 0.1 10*3/uL (ref 0.0–0.5)
Eosinophils Relative: 1 %
HCT: 34.5 % — ABNORMAL LOW (ref 36.0–46.0)
Hemoglobin: 10.5 g/dL — ABNORMAL LOW (ref 12.0–15.0)
Immature Granulocytes: 0 %
Lymphocytes Relative: 14 %
Lymphs Abs: 1.6 10*3/uL (ref 0.7–4.0)
MCH: 31.7 pg (ref 26.0–34.0)
MCHC: 30.4 g/dL (ref 30.0–36.0)
MCV: 104.2 fL — ABNORMAL HIGH (ref 80.0–100.0)
Monocytes Absolute: 1.2 10*3/uL — ABNORMAL HIGH (ref 0.1–1.0)
Monocytes Relative: 10 %
Neutro Abs: 8.8 10*3/uL — ABNORMAL HIGH (ref 1.7–7.7)
Neutrophils Relative %: 74 %
Platelets: 339 10*3/uL (ref 150–400)
RBC: 3.31 MIL/uL — ABNORMAL LOW (ref 3.87–5.11)
RDW: 12.7 % (ref 11.5–15.5)
WBC: 11.8 10*3/uL — ABNORMAL HIGH (ref 4.0–10.5)
nRBC: 0 % (ref 0.0–0.2)

## 2021-04-03 MED ORDER — CEFDINIR 300 MG PO CAPS: 300.0000 mg | ORAL_CAPSULE | Freq: Two times a day (BID) | ORAL | Status: DC

## 2021-04-03 MED ORDER — CEFDINIR 300 MG PO CAPS
300.0000 mg | ORAL_CAPSULE | Freq: Two times a day (BID) | ORAL | Status: AC
  Administered 2021-04-03 – 2021-04-06 (×6): 300 mg via ORAL
  Filled 2021-04-03 (×7): qty 1

## 2021-04-03 MED ORDER — AZITHROMYCIN 250 MG PO TABS
500.0000 mg | ORAL_TABLET | Freq: Every day | ORAL | Status: AC
  Administered 2021-04-03: 500 mg via ORAL
  Filled 2021-04-03: qty 2

## 2021-04-03 MED ORDER — NICOTINE 14 MG/24HR TD PT24
14.0000 mg | MEDICATED_PATCH | Freq: Every day | TRANSDERMAL | Status: DC
  Administered 2021-04-03: 14 mg via TRANSDERMAL
  Filled 2021-04-03 (×5): qty 1

## 2021-04-03 NOTE — ED Notes (Signed)
Pt assisted to chair, snacks and Coke provided

## 2021-04-03 NOTE — Discharge Planning (Signed)
°  Transition of Care [facility]) Screening Note   Patient Details  Name: Angela Medina Date of Birth: 1950-07-03   Transition of Care [facility]) CM/SW Contact:    Oletta Cohn, RN Phone Number: 04/03/2021, 9:58 AM    Transition of Care Department [facility]) has reviewed patient and no TOC needs have been identified at this time. We will continue to monitor patient advancement through interdisciplinary progression rounds. If new patient transition needs arise, please place a TOC consult.

## 2021-04-03 NOTE — Progress Notes (Addendum)
HD#0 SUBJECTIVE:  Patient Summary: Angela Medina is a 70 y.o. with a pertinent PMH of schizoaffective disorder and depression, who presented with shaking rigors and bowel/bladder incontinence and admitted for community acquired pneumonia.   Overnight Events: no acute events overnight    Interm History: Patient seen and evaluated at bedside. Refusing supplemental O2 overnight, but wearing [place] during exam. Angela Medina reports that Angela Medina feels improved, but still  producing sputum. Reports feeling anxious today. No other complaints.   OBJECTIVE:  Vital Signs: Vitals:   04/03/21 1230 04/03/21 1300 04/03/21 1430 04/03/21 1445  BP: (!) 149/101 140/87 (!) 167/97 (!) 151/137  Pulse: 82 82 97 98  Resp: (!) 26 (!) 32 (!) 30 20  Temp:      TempSrc:      SpO2: 94% 95% 97% 96%   Supplemental O2: Nasal Cannula SpO2: 96 % O2 Flow Rate (L/min): 2 L/min  There were no vitals filed for this visit.  No intake or output data in the 24 hours ending 04/03/21 1513 Net IO Since Admission: No IO data has been entered for this period [04/03/21 1513]  Physical Exam: Physical Exam Constitutional:      General: Angela Medina is not in acute distress.    Appearance: Angela Medina is normal weight. Angela Medina is not ill-appearing, toxic-appearing or diaphoretic.  HENT:     Head: Atraumatic.  Eyes:     Extraocular Movements: Extraocular movements intact.     Pupils: Pupils are equal, round, and reactive to light.  Cardiovascular:     Rate and Rhythm: Normal rate and regular rhythm.  Musculoskeletal:        General: Normal range of motion.     Cervical back: Normal range of motion and neck supple.  Skin:    General: Skin is warm and dry.  Neurological:     General: No focal deficit present.     Mental Status: Angela Medina is alert. Mental status is at baseline.  Psychiatric:     Comments: anxious    Patient Lines/Drains/Airways Status     Active Line/Drains/Airways     Name Placement date Placement time Site Days   Peripheral IV  04/02/21 22 G Anterior;Right Hand 04/02/21  1802  Hand  1            Pertinent Labs: CBC Latest Ref Rng & Units 04/03/2021 04/02/2021 04/02/2021  WBC 4.0 - 10.5 K/uL 11.8(H) - 16.4(H)  Hemoglobin 12.0 - 15.0 g/dL 10.5(L) 12.2 11.9(L)  Hematocrit 36.0 - 46.0 % 34.5(L) 36.0 37.9  Platelets 150 - 400 K/uL 339 - 381    CMP Latest Ref Rng & Units 04/03/2021 04/02/2021 04/02/2021  Glucose 70 - 99 mg/dL 90 - 188(C)  BUN 8 - 23 mg/dL 21 - 16(S)  Creatinine 0.44 - 1.00 mg/dL 0.63 - 0.16  Sodium 010 - 145 mmol/L 139 139 138  Potassium 3.5 - 5.1 mmol/L 4.3 3.9 4.1  Chloride 98 - 111 mmol/L 103 - 99  CO2 22 - 32 mmol/L 27 - 31  Calcium 8.9 - 10.3 mg/dL 8.1(L) - 8.3(L)  Total Protein 6.5 - 8.1 g/dL - - 6.3(L)  Total Bilirubin 0.3 - 1.2 mg/dL - - 0.3  Alkaline Phos 38 - 126 U/L - - 128(H)  AST 15 - 41 U/L - - 85(H)  ALT 0 - 44 U/L - - 50(H)    No results for input(s): GLUCAP in the last 72 hours.    ASSESSMENT/PLAN:   Assessment: Principal Problem:   Bronchopneumonia Active Problems:  Schizoaffective disorder ([facility])   Acute hypoxemic respiratory failure ([facility])   Community acquired pneumonia   Angela Medina is a 70 y.o. with pertinent PMH of schizoaffective disorder who presented with shaking rigors and admit for community acquired pneumonia on hospital day 0. Patient was previously admitted to this service and discharged at her request after being evaluated by psychiatry and found to have capacity. Patient has been actively hallucinating and was treatment was initially attempted as an outpatient at the request of the patient, however outpatient treatment has been complicated by her mental status and difficult living situation outside of the hospital. Furthermore, Angela Medina has an oxygen requirement of 2L to maintain her O2 saturations >88%.   Plan: #Community acquired pneumonia - patient was recently admitted for treatment of her pneumonia. Angela Medina was discharged on oral Cefdinir at her  request, however, Angela Medina was unable to complete outpatient therapy and was readmitted the same day due to her difficult living situation outside of the hospital.   - pt requiring 2L [place] to maintian O2 saturations greater than 94%. Continues to produce sputum. Patient periodically refuses to wear supplemental O2 when Angela Medina becomes agitated. Will try to redirect as able and encourage O2 usage.   - Oral Cefdinir 300 BID. Plan for total 5 day course of antibiotics (12/11-16).  - Supplemental O2 as needed with O2 saturation >94%. Wean as tolerated - Duoneb 12ml q6hr as needed  #Schizoaffective disorder - mental status and mood appear stable at this time. Angela Medina engages in exam and answers questions appropriately. Reports some anxiety.  Patient has zyprexa 5mg  QHS and 2.5 mg BID PRN.   Elevated liver enzymes - AST and ALT mildly elevated. No reports of abd pain at this time. Will continue to monitor.   Best Practice: Diet: Regular diet VTE: enoxaparin (LOVENOX) injection 40 mg Start: 04/02/21 2200 Code: Full AB: cefdinir 300 BID DISPO: Anticipated discharge in 2-3 days to Home pending Medical stability.  Signature: 14/12/22, MD  Internal Medicine Resident, PGY-1 Adron Bene [facility] Res[facility]  Pager: [phone] 3:13 PM, 04/03/2021   Please contact the on call pager after 5 pm and on weekends at [phone].

## 2021-04-03 NOTE — ED Notes (Signed)
Patient is resting comfortably. 

## 2021-04-03 NOTE — ED Notes (Signed)
Pt is eating lunch at this time

## 2021-04-03 NOTE — ED Notes (Signed)
Attempted to replace [place], pt refused. Oxygen sats 86% on room air.

## 2021-04-03 NOTE — ED Notes (Signed)
Pt requesting additional dose to zyprexa; message sent to pharmacy

## 2021-04-03 NOTE — ED Notes (Signed)
Pt had medications that she was previously discharged with in her belongings bag. Found pt attempting to take her doses of Zyprexa. Medications removed from room

## 2021-04-03 NOTE — ED Notes (Signed)
RN helped pt to bed side commode and set pt up for lunch tray.Pt eating.

## 2021-04-03 NOTE — ED Notes (Signed)
RN and NT repositioned pt

## 2021-04-03 NOTE — ED Notes (Signed)
RN helped assist pt to bed side commode. O2 stats stable at 94%.

## 2021-04-03 NOTE — ED Notes (Signed)
RN readjusted pt.  

## 2021-04-03 NOTE — ED Notes (Signed)
Pt reconnect to cardiac monitoring, bp, and pulse ox

## 2021-04-03 NOTE — ED Notes (Signed)
Pt removed her oxygen, refused to put it back on

## 2021-04-03 NOTE — ED Notes (Signed)
Pt's oxygen saturation was reading at 87%. 2L oxygen via nasal cannula applied. Pt resting in bed.

## 2021-04-03 NOTE — ED Notes (Signed)
Pt again removed monitoring wires, found sitting in a chair in the room. Pt also noted to have her belongings and lit a cigarette in the room.Informed pt of no smoking policy.

## 2021-04-03 NOTE — ED Notes (Signed)
Pt laying back in bed at moment, on room air, will not tolerate cannula

## 2021-04-03 NOTE — ED Notes (Signed)
Pt had removed pulse ox. Replaced monitoring cords, pulse ox 88% RA, placed on 2L [place]. Pt requesting more medication for anxiety

## 2021-04-03 NOTE — ED Notes (Signed)
Pt assisted to the bedside commode. Pt's bed linens changed and ot provided with a fresh gown. Pt assisted back to the bed and monitoring reconnected.

## 2021-04-04 DIAGNOSIS — J180 Bronchopneumonia, unspecified organism: Secondary | ICD-10-CM | POA: Diagnosis not present

## 2021-04-04 LAB — COMPREHENSIVE METABOLIC PANEL
ALT: 70 U/L — ABNORMAL HIGH (ref 0–44)
AST: 55 U/L — ABNORMAL HIGH (ref 15–41)
Albumin: 2.5 g/dL — ABNORMAL LOW (ref 3.5–5.0)
Alkaline Phosphatase: 171 U/L — ABNORMAL HIGH (ref 38–126)
Anion gap: 6 (ref 5–15)
BUN: 8 mg/dL (ref 8–23)
CO2: 36 mmol/L — ABNORMAL HIGH (ref 22–32)
Calcium: 8.2 mg/dL — ABNORMAL LOW (ref 8.9–10.3)
Chloride: 97 mmol/L — ABNORMAL LOW (ref 98–111)
Creatinine, Ser: 0.44 mg/dL (ref 0.44–1.00)
GFR, Estimated: >60 mL/min (ref >60)
Glucose, Bld: 107 mg/dL — ABNORMAL HIGH (ref 70–99)
Potassium: 3.4 mmol/L — ABNORMAL LOW (ref 3.5–5.1)
Sodium: 139 mmol/L (ref 135–145)
Total Bilirubin: 0.3 mg/dL (ref 0.3–1.2)
Total Protein: 6.1 g/dL — ABNORMAL LOW (ref 6.5–8.1)

## 2021-04-04 LAB — CBC
HCT: 38.6 % (ref 36.0–46.0)
Hemoglobin: 12.4 g/dL (ref 12.0–15.0)
MCH: 32.6 pg (ref 26.0–34.0)
MCHC: 32.1 g/dL (ref 30.0–36.0)
MCV: 101.6 fL — ABNORMAL HIGH (ref 80.0–100.0)
Platelets: 413 10*3/uL — ABNORMAL HIGH (ref 150–400)
RBC: 3.8 MIL/uL — ABNORMAL LOW (ref 3.87–5.11)
RDW: 12.7 % (ref 11.5–15.5)
WBC: 9.4 10*3/uL (ref 4.0–10.5)
nRBC: 0 % (ref 0.0–0.2)

## 2021-04-04 MED ORDER — POTASSIUM CHLORIDE CRYS ER 20 MEQ PO TBCR
40.0000 meq | EXTENDED_RELEASE_TABLET | Freq: Two times a day (BID) | ORAL | Status: AC
  Administered 2021-04-04: 10:00:00 40 meq via ORAL
  Filled 2021-04-04: qty 2

## 2021-04-04 NOTE — Progress Notes (Signed)
HD#1 SUBJECTIVE:  Patient Summary: Angela Medina is a 70 y.o. with a pertinent PMH of schizoaffective disorder and depression, who presented with shaking rigors and bowel/bladder incontinence and admitted for community acquired pneumonia.   Overnight Events: no acute events overnight    Interm History: Patient seen and evaluated at bedside. Became slightly agitated overnight and was given Zyprexa PRN dose. Still having productive cough.    OBJECTIVE:  Vital Signs: Vitals:   04/03/21 1955 04/04/21 0018 04/04/21 0443 04/04/21 0713  BP: 132/73 108/61 124/68 (!) 138/98  Pulse: 69 70 61 84  Resp: 17 17 16 16   Temp: 98.4 F (36.9 C) 97.8 F (36.6 C) 97.6 F (36.4 C) 97.7 F (36.5 C)  TempSrc: Oral Oral Oral Oral  SpO2: 92% 94% 90% 91%   Supplemental O2: Nasal Cannula SpO2: 91 % O2 Flow Rate (L/min): 2 L/min  There were no vitals filed for this visit.   Intake/Output Summary (Last 24 hours) at 04/04/2021 1507 Last data filed at 04/03/2021 2200 Gross per 24 hour  Intake 240 ml  Output 1000 ml  Net -760 ml   Net IO Since Admission: -760 mL [04/04/21 1507]  Physical Exam: Physical Exam Constitutional:      General: She is not in acute distress.    Appearance: She is normal weight. She is not ill-appearing, toxic-appearing or diaphoretic.  HENT:     Head: Atraumatic.  Eyes:     Extraocular Movements: Extraocular movements intact.     Pupils: Pupils are equal, round, and reactive to light.  Cardiovascular:     Rate and Rhythm: Normal rate and regular rhythm.  Pulmonary:     Effort: Pulmonary effort is normal.     Comments: Mild crackles Musculoskeletal:        General: Normal range of motion.     Cervical back: Normal range of motion and neck supple.  Skin:    General: Skin is warm and dry.  Neurological:     General: No focal deficit present.     Mental Status: She is alert. Mental status is at baseline.  Psychiatric:     Comments: anxious    Patient  Lines/Drains/Airways Status     Active Line/Drains/Airways     Name Placement date Placement time Site Days   Peripheral IV 04/02/21 22 G Anterior;Right Hand 04/02/21  1802  Hand  1            Pertinent Labs: CBC Latest Ref Rng & Units 04/04/2021 04/03/2021 04/02/2021  WBC 4.0 - 10.5 K/uL 9.4 11.8(H) -  Hemoglobin 12.0 - 15.0 g/dL 12.4 10.5(L) 12.2  Hematocrit 36.0 - 46.0 % 38.6 34.5(L) 36.0  Platelets 150 - 400 K/uL 413(H) 339 -    CMP Latest Ref Rng & Units 04/04/2021 04/03/2021 04/02/2021  Glucose 70 - 99 mg/dL 107(H) 90 -  BUN 8 - 23 mg/dL 8 21 -  Creatinine 0.44 - 1.00 mg/dL 0.44 0.50 -  Sodium 135 - 145 mmol/L 139 139 139  Potassium 3.5 - 5.1 mmol/L 3.4(L) 4.3 3.9  Chloride 98 - 111 mmol/L 97(L) 103 -  CO2 22 - 32 mmol/L 36(H) 27 -  Calcium 8.9 - 10.3 mg/dL 8.2(L) 8.1(L) -  Total Protein 6.5 - 8.1 g/dL 6.1(L) - -  Total Bilirubin 0.3 - 1.2 mg/dL 0.3 - -  Alkaline Phos 38 - 126 U/L 171(H) - -  AST 15 - 41 U/L 55(H) - -  ALT 0 - 44 U/L 70(H) - -  No results for input(s): GLUCAP in the last 72 hours.    ASSESSMENT/PLAN:   Assessment: Principal Problem:   Bronchopneumonia Active Problems:   Schizoaffective disorder (HCC)   Acute hypoxemic respiratory failure (HCC)   Community acquired pneumonia   Angela Medina is a 70 y.o. with pertinent PMH of schizoaffective disorder who presented with shaking rigors and admit for community acquired pneumonia on hospital day 1. Patient was previously admitted to this service and discharged at her request after being evaluated by psychiatry and found to have capacity. Patient has been actively hallucinating and was treatment was initially attempted as an outpatient at the request of the patient, however outpatient treatment has been complicated by her mental status and difficult living situation outside of the hospital. Furthermore, she has an oxygen requirement of 2L to maintain her O2 saturations >88%.   Plan: #Community  acquired pneumonia - patient was recently admitted for treatment of her pneumonia. She was discharged on oral Cefdinir at her request, however, she was unable to complete outpatient therapy and was readmitted the same day due to her difficult living situation outside of the hospital.   - maintian O2 saturations greater than 94%. Patient's O2 sats drop to 92% while ambulating on RA. She would likely require supplemental O2 as an outpatient, however, given difficult living situation she would not be safe to discharge without being able to saturate >94% on RA. Continues to produce sputum. Patient periodically refuses to wear supplemental O2 when she becomes agitated. Will try to redirect as able and encourage O2 usage.   - Oral Cefdinir 300 BID. Plan for total 5 day course of antibiotics (12/11-16).  - Supplemental O2 as needed with O2 saturation >94%. Wean as tolerated - Duoneb 50ml q6hr as needed  #Schizoaffective disorder - mental status and mood appear stable at this time. She engages in exam and answers questions appropriately. Reports some anxiety.  Patient has zyprexa 5mg  QHS and 2.5 mg BID PRN.   Elevated liver enzymes - AST and ALT mildly elevated. No reports of abd pain at this time. Will continue to monitor.   Best Practice: Diet: Regular diet VTE: enoxaparin (LOVENOX) injection 40 mg Start: 04/02/21 2200 Code: Full AB: cefdinir 300 BID DISPO: Anticipated discharge in 2-3 days to Home pending Medical stability.  Signature: 14/12/22, MD  Internal Medicine Resident, PGY-1 Adron Bene [facility]  Pager: [phone] 3:07 PM, 04/04/2021   Please contact the on call pager after 5 pm and on weekends at [phone].

## 2021-04-04 NOTE — Progress Notes (Signed)
SATURATION QUALIFICATIONS: (This note is used to comply with regulatory documentation for home oxygen)  Patient Saturations on Room Air at Rest = 93%  Patient Saturations on Room Air while Ambulating = 92%  Patient Saturations on  room air while Ambulating = 92%  Please briefly explain why patient needs home oxygen:  Patient required no oxygen while ambulating in room.

## 2021-04-05 LAB — COMPREHENSIVE METABOLIC PANEL
ALT: 50 U/L — ABNORMAL HIGH (ref 0–44)
AST: 30 U/L (ref 15–41)
Albumin: 2.5 g/dL — ABNORMAL LOW (ref 3.5–5.0)
Alkaline Phosphatase: 147 U/L — ABNORMAL HIGH (ref 38–126)
Anion gap: 8 (ref 5–15)
BUN: 14 mg/dL (ref 8–23)
CO2: 34 mmol/L — ABNORMAL HIGH (ref 22–32)
Calcium: 8.4 mg/dL — ABNORMAL LOW (ref 8.9–10.3)
Chloride: 95 mmol/L — ABNORMAL LOW (ref 98–111)
Creatinine, Ser: 0.61 mg/dL (ref 0.44–1.00)
GFR, Estimated: >60 mL/min (ref >60)
Glucose, Bld: 97 mg/dL (ref 70–99)
Potassium: 4.3 mmol/L (ref 3.5–5.1)
Sodium: 137 mmol/L (ref 135–145)
Total Bilirubin: 0.4 mg/dL (ref 0.3–1.2)
Total Protein: 5.9 g/dL — ABNORMAL LOW (ref 6.5–8.1)

## 2021-04-05 LAB — CBC
HCT: 39.2 % (ref 36.0–46.0)
Hemoglobin: 12.7 g/dL (ref 12.0–15.0)
MCH: 32.1 pg (ref 26.0–34.0)
MCHC: 32.4 g/dL (ref 30.0–36.0)
MCV: 99 fL (ref 80.0–100.0)
Platelets: 418 10*3/uL — ABNORMAL HIGH (ref 150–400)
RBC: 3.96 MIL/uL (ref 3.87–5.11)
RDW: 12.6 % (ref 11.5–15.5)
WBC: 9.6 10*3/uL (ref 4.0–10.5)
nRBC: 0 % (ref 0.0–0.2)

## 2021-04-05 MED ORDER — RAMELTEON 8 MG PO TABS
8.0000 mg | ORAL_TABLET | Freq: Every day | ORAL | Status: DC
  Administered 2021-04-05 – 2021-04-08 (×4): 8 mg via ORAL
  Filled 2021-04-05 (×6): qty 1

## 2021-04-05 NOTE — TOC Initial Note (Signed)
Transition of Care [facility]) - Initial/Assessment Note    Patient Details  Name: Angela Medina MRN: [id number] Date of Birth: 02/21/1951  Transition [facility] Care [facility]) CM/SW Contact:    Jimmy Picket, LCSW Phone Number: 04/05/2021, 11:50 AM  Clinical Narrative:                  CSW spoke to pt at bedside. CSW inquired about home situation. Pt reports she is homeless and lives on the street and usually stays off [address] In [place]. Pt reports she has a sister but does not know how to contact her.   Pt told CSW that she received a call from the federal building stating they had money for her and she needs to get to a computer she she can get a code to access the money and they will overnight a card to the hospital. Pt did not know what website to use.   CSW notes patient is homeless and unfortunately at this time there is significant barriers to securing housing in the medical setting. CSW has provided patient with information on resources to follow-up with in the community once they discharge. CSW notes patient arrived to the hospital with no current residence and at this time when she is medically cleared she will be appropriate to discharge back to their current living situation.    Expected Discharge Plan: Home/Self Care Barriers to Discharge: Continued Medical Work up   Patient Goals and CMS Choice Patient states their goals for this hospitalization and ongoing recovery are:: to get better      Expected Discharge Plan and Services Expected Discharge Plan: Home/Self Care       Living arrangements for the past 2 months: Homeless                                      Prior Living Arrangements/Services Living arrangements for the past 2 months: Homeless   Patient language and need for interpreter reviewed:: Yes        Need for Family Participation in Patient Care: Yes (Comment) Care giver support system in place?: Yes (comment)   Criminal Activity/Legal  Involvement Pertinent to Current Situation/Hospitalization: No - Comment as needed  Activities of Daily Living      Permission Sought/Granted                  Emotional Assessment Appearance:: Appears stated age Attitude/Demeanor/Rapport: Engaged Affect (typically observed): Appropriate Orientation: : Oriented to Self, Oriented to Place, Oriented to  Time, Oriented to Situation, Fluctuating Orientation (Suspected and/or reported Sundowners) Alcohol / Substance Use: Not Applicable Psych Involvement: No (comment)  Admission diagnosis:  Community acquired pneumonia [J18.9] Hypoxia [R09.02] Seizure-like activity (HCC) [R56.9] Acute on chronic respiratory failure with hypoxia (HCC) [J96.21] Acute hypoxemic respiratory failure (HCC) [J96.01] Community acquired pneumonia, unspecified laterality [J18.9] Patient Active Problem List   Diagnosis Date Noted   Community acquired pneumonia 04/03/2021   Schizoaffective disorder (HCC) 04/02/2021   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 [place] [address] Phone: [phone] Fax: [phone]  [facility] [address] [place] [address] Phone: [phone] Fax: [phone]     Social Determinants of Health (SDOH) Interventions    Readmission Risk Interventions No flowsheet data found.  Jimmy Picket, LCSW Clinical Social Worker

## 2021-04-05 NOTE — Progress Notes (Addendum)
HD#2 SUBJECTIVE:  Patient Summary: Angela Medina is a 70 y.o. with a pertinent PMH of schizoaffective disorder and depression, who presented with shaking rigors and bowel/bladder incontinence and admitted for community acquired pneumonia.   Overnight Events: no acute events overnight    Interm History: Patient seen and evaluated at bedside. Patient reports that she is feeling tired today as she did not sleep well. Breathing feels better  No other complaints at this time. No pain  OBJECTIVE:  Vital Signs: Vitals:   04/04/21 0713 04/04/21 1630 04/04/21 2025 04/05/21 0442  BP: (!) 138/98 (!) 184/97 (!) 158/97 131/69  Pulse: 84 80 93 85  Resp: 16 16  18   Temp: 97.7 F (36.5 C) 98.7 F (37.1 C) 98.9 F (37.2 C) 99.5 F (37.5 C)  TempSrc: Oral Oral Oral Oral  SpO2: 91% 97% 96% (!) 89%   Supplemental O2: Nasal Cannula SpO2: (!) 89 % O2 Flow Rate (L/min): 2 L/min  There were no vitals filed for this visit.  No intake or output data in the 24 hours ending 04/05/21 1335  Net IO Since Admission: -760 mL [04/05/21 1335]  Physical Exam: Physical Exam Constitutional:      General: She is not in acute distress.    Appearance: She is normal weight. She is not ill-appearing, toxic-appearing or diaphoretic.  HENT:     Head: Atraumatic.  Eyes:     Extraocular Movements: Extraocular movements intact.     Pupils: Pupils are equal, round, and reactive to light.  Cardiovascular:     Rate and Rhythm: Normal rate and regular rhythm.  Pulmonary:     Effort: Pulmonary effort is normal.     Comments: Mild crackles Musculoskeletal:        General: Normal range of motion.     Cervical back: Normal range of motion and neck supple.  Skin:    General: Skin is warm and dry.  Neurological:     General: No focal deficit present.     Mental Status: She is alert. Mental status is at baseline.  Psychiatric:     Comments: anxious    Patient Lines/Drains/Airways Status     Active  Line/Drains/Airways     Name Placement date Placement time Site Days   Peripheral IV 04/02/21 22 G Anterior;Right Hand 04/02/21  1802  Hand  1            Pertinent Labs: CBC Latest Ref Rng & Units 04/05/2021 04/04/2021 04/03/2021  WBC 4.0 - 10.5 K/uL 9.6 9.4 11.8(H)  Hemoglobin 12.0 - 15.0 g/dL 04/05/2021 40.9 10.5(L)  Hematocrit 36.0 - 46.0 % 39.2 38.6 34.5(L)  Platelets 150 - 400 K/uL 418(H) 413(H) 339    CMP Latest Ref Rng & Units 04/05/2021 04/04/2021 04/03/2021  Glucose 70 - 99 mg/dL 97 04/05/2021) 90  BUN 8 - 23 mg/dL 14 8 21   Creatinine 0.44 - 1.00 mg/dL 914(N 8.29  Sodium 135 - 145 mmol/L 137 139 139  Potassium 3.5 - 5.1 mmol/L 4.3 3.4(L) 4.3  Chloride 98 - 111 mmol/L 95(L) 97(L) 103  CO2 22 - 32 mmol/L 34(H) 36(H) 27  Calcium 8.9 - 10.3 mg/dL 5.62) 1.30) 8.1(L)  Total Protein 6.5 - 8.1 g/dL 5.9(L) 6.1(L) -  Total Bilirubin 0.3 - 1.2 mg/dL 0.4 0.3 -  Alkaline Phos 38 - 126 U/L 147(H) 171(H) -  AST 15 - 41 U/L 30 55(H) -  ALT 0 - 44 U/L 50(H) 70(H) -    No results for input(s):  GLUCAP in the last 72 hours.    ASSESSMENT/PLAN:   Assessment: Principal Problem:   Bronchopneumonia Active Problems:   Schizoaffective disorder (HCC)   Acute hypoxemic respiratory failure (HCC)   Community acquired pneumonia   Angela Medina is a 70 y.o. with pertinent PMH of schizoaffective disorder who presented with shaking rigors and admit for community acquired pneumonia on hospital day 2. Patient was previously admitted to this service and discharged at her request after being evaluated by psychiatry and found to have capacity. Patient has been actively hallucinating and was treatment was initially attempted as an outpatient at the request of the patient, however outpatient treatment has been complicated by her mental status and difficult living situation outside of the hospital. She is no longer requiring supplemental O2.   Plan: #Community acquired pneumonia - patient was  recently admitted for treatment of her pneumonia. She was discharged on oral Cefdinir at her request, however, she was unable to complete outpatient therapy and was readmitted the same day due to her difficult living situation outside of the hospital.   - maintian O2 saturations greater than 94%. Patient no longer requiring supplemental O2.    - Oral Cefdinir 300 BID. Plan for total 5 day course of antibiotics (12/11-16). TOC assisting with discharge planning as pt gets closer to completing treatment. - Supplemental O2 as needed with O2 saturation >94%. - Duoneb 9ml q6hr as needed  #Schizoaffective disorder - mental status and mood appear stable at this time. She engages in exam and answers questions appropriately.   Patient has zyprexa 5mg  QHS and 2.5 mg BID PRN.   Elevated liver enzymes - AST and ALT resolving. No reports of abd pain at this time. Will continue to monitor.   Best Practice: Diet: Regular diet VTE: enoxaparin (LOVENOX) injection 40 mg Start: 04/02/21 2200 Code: Full AB: cefdinir 300 BID DISPO: Anticipated discharge in 2-3 days to Home pending Medical stability.  Signature: Delene Ruffini, MD  Internal Medicine Resident, PGY-1 [facility]  Pager: [phone] 1:35 PM, 04/05/2021   Please contact the on call pager after 5 pm and on weekends at [phone].

## 2021-04-06 LAB — CBC
HCT: 37.2 % (ref 36.0–46.0)
Hemoglobin: 12.2 g/dL (ref 12.0–15.0)
MCH: 33.1 pg (ref 26.0–34.0)
MCHC: 32.8 g/dL (ref 30.0–36.0)
MCV: 100.8 fL — ABNORMAL HIGH (ref 80.0–100.0)
Platelets: 427 10*3/uL — ABNORMAL HIGH (ref 150–400)
RBC: 3.69 MIL/uL — ABNORMAL LOW (ref 3.87–5.11)
RDW: 12.9 % (ref 11.5–15.5)
WBC: 8.4 10*3/uL (ref 4.0–10.5)
nRBC: 0 % (ref 0.0–0.2)

## 2021-04-06 LAB — BASIC METABOLIC PANEL
Anion gap: 8 (ref 5–15)
BUN: 18 mg/dL (ref 8–23)
CO2: 33 mmol/L — ABNORMAL HIGH (ref 22–32)
Calcium: 8.1 mg/dL — ABNORMAL LOW (ref 8.9–10.3)
Chloride: 95 mmol/L — ABNORMAL LOW (ref 98–111)
Creatinine, Ser: 0.68 mg/dL (ref 0.44–1.00)
GFR, Estimated: >60 mL/min (ref >60)
Glucose, Bld: 89 mg/dL (ref 70–99)
Potassium: 4.3 mmol/L (ref 3.5–5.1)
Sodium: 136 mmol/L (ref 135–145)

## 2021-04-06 NOTE — Progress Notes (Signed)
SATURATION QUALIFICATIONS: (This note is used to comply with regulatory documentation for home oxygen)  Patient Saturations on Room Air at Rest = 99%  Patient Saturations on Room Air while Ambulating = 94%  Pt denies shortness of breath/  Ambulated 200 ft without supplemental oxygen.  Pt Tolerated well

## 2021-04-06 NOTE — Plan of Care (Signed)
  Problem: Clinical Measurements: Goal: Ability to maintain clinical measurements within normal limits will improve Outcome: Progressing   Problem: Clinical Measurements: Goal: Respiratory complications will improve Outcome: Progressing   Problem: Activity: Goal: Risk for activity intolerance will decrease Outcome: Progressing   

## 2021-04-06 NOTE — Progress Notes (Signed)
Pt refuse to get her shift assessment.  Per pt she "will leave that for the doctors to do".  Pt is alert and oriented x 3, disoriented to situation. MD notified.  Will continue to monitor.

## 2021-04-06 NOTE — Progress Notes (Signed)
HD#3 SUBJECTIVE:  Patient Summary: Angela Medina is a 70 y.o. with a pertinent PMH of schizoaffective disorder and depression, who presented with shaking rigors and bowel/bladder incontinence and admitted for community acquired pneumonia.   Overnight Events: no acute events overnight    Interm History: Patient seen and evaluated at bedside. She reports that she is feeling better but continues to complain of shortness of breath and yellow sputum production. She states she does not feel safe discharging at this time.   OBJECTIVE:  Vital Signs: Vitals:   04/05/21 2047 04/05/21 2102 04/06/21 0420 04/06/21 0728  BP: (!) 160/104 (!) 148/92 127/69 118/83  Pulse: (!) 57  63 70  Resp:   (!) 21 18  Temp: 99.5 F (37.5 C)  98.2 F (36.8 C) 98.6 F (37 C)  TempSrc: Oral  Oral Oral  SpO2: (!) 87% 90% 92% 92%   Supplemental O2: Nasal Cannula SpO2: 92 % O2 Flow Rate (L/min): 2 L/min  There were no vitals filed for this visit.   Intake/Output Summary (Last 24 hours) at 04/06/2021 1451 Last data filed at 04/06/2021 1300 Gross per 24 hour  Intake 1160 ml  Output --  Net 1160 ml    Net IO Since Admission: 400 mL [04/06/21 1451]  Physical Exam: Physical Exam Constitutional:      General: She is not in acute distress.    Appearance: She is normal weight. She is not ill-appearing, toxic-appearing or diaphoretic.  HENT:     Head: Atraumatic.     Mouth/Throat:     Mouth: Mucous membranes are moist.     Pharynx: Oropharynx is clear.  Eyes:     Extraocular Movements: Extraocular movements intact.     Pupils: Pupils are equal, round, and reactive to light.  Cardiovascular:     Rate and Rhythm: Normal rate and regular rhythm.  Pulmonary:     Effort: Pulmonary effort is normal. No respiratory distress.     Breath sounds: Normal breath sounds. No wheezing or rales.  Abdominal:     General: Abdomen is flat. Bowel sounds are normal.     Palpations: Abdomen is soft.   Musculoskeletal:        General: Normal range of motion.     Cervical back: Normal range of motion and neck supple.  Skin:    General: Skin is warm and dry.  Neurological:     General: No focal deficit present.     Mental Status: She is alert. Mental status is at baseline.  Psychiatric:        Behavior: Behavior normal.     Comments: Anxious, reserved    Patient Lines/Drains/Airways Status     Active Line/Drains/Airways     Name Placement date Placement time Site Days   Peripheral IV 04/02/21 22 G Anterior;Right Hand 04/02/21  1802  Hand  1            Pertinent Labs: CBC Latest Ref Rng & Units 04/06/2021 04/05/2021 04/04/2021  WBC 4.0 - 10.5 K/uL 8.4 9.6 9.4  Hemoglobin 12.0 - 15.0 g/dL 12.2 12.7 12.4  Hematocrit 36.0 - 46.0 % 37.2 39.2 38.6  Platelets 150 - 400 K/uL 427(H) 418(H) 413(H)    CMP Latest Ref Rng & Units 04/06/2021 04/05/2021 04/04/2021  Glucose 70 - 99 mg/dL 89 97 107(H)  BUN 8 - 23 mg/dL 18 14 8   Creatinine 0.44 - 1.00 mg/dL 0.68 0.61 0.44  Sodium 135 - 145 mmol/L 136 137 139  Potassium 3.5 -  5.1 mmol/L 4.3 4.3 3.4(L)  Chloride 98 - 111 mmol/L 95(L) 95(L) 97(L)  CO2 22 - 32 mmol/L 33(H) 34(H) 36(H)  Calcium 8.9 - 10.3 mg/dL 8.1(L) 8.4(L) 8.2(L)  Total Protein 6.5 - 8.1 g/dL - 5.9(L) 6.1(L)  Total Bilirubin 0.3 - 1.2 mg/dL - 0.4 0.3  Alkaline Phos 38 - 126 U/L - 147(H) 171(H)  AST 15 - 41 U/L - 30 55(H)  ALT 0 - 44 U/L - 50(H) 70(H)    No results for input(s): GLUCAP in the last 72 hours.    ASSESSMENT/PLAN:   Assessment: Principal Problem:   Bronchopneumonia Active Problems:   Schizoaffective disorder (HCC)   Acute hypoxemic respiratory failure (HCC)   Community acquired pneumonia   Angela Medina is a 70 y.o. with pertinent PMH of schizoaffective disorder who presented with shaking rigors and admit for community acquired pneumonia on hospital day 3. Patient was previously admitted to this service and discharged at her request after  being evaluated by psychiatry and found to have capacity. Patient has been actively hallucinating and was treatment was initially attempted as an outpatient at the request of the patient, however outpatient treatment has been complicated by her mental status and difficult living situation outside of the hospital. She has completed a course of oral Cefdinir and is no longer requiring supplemental O2 to maintain O2 saturation. She does endorse concerns over discharge. States she feels weak and would not be able to care for herself, especially given unstable housing situation.    Plan: #Community acquired pneumonia - patient was recently admitted for treatment of her pneumonia. She was discharged on oral Cefdinir at her request, however, she was unable to complete outpatient therapy and was readmitted the same day due to her difficult living situation outside of the hospital.   (12/11-16). TOC assisting with discharge planning as pt gets closer to completing treatment. - patient has completed a 5 day course of oral Cefdinir with resolution in pulmonary symptoms. She is no longer requiring supplemental O2 and ambulatory stats have stayed >94%, however patient continues to endorse concern over discharge stating she does not yet feel back to baseline. Will continue to work with PT for now.   #Schizoaffective disorder - mental status and mood appear stable at this time. She engages in exam and answers questions appropriately.   Patient has zyprexa 5mg  QHS and 2.5 mg BID PRN.   Elevated liver enzymes - AST and ALT resolving. No reports of abd pain at this time. Will continue to monitor.   Best Practice: Diet: Regular diet VTE: enoxaparin (LOVENOX) injection 40 mg Start: 04/02/21 2200 Code: Full AB: cefdinir 300 BID DISPO: Anticipated discharge in 2-3 days to Home pending Medical stability.  Signature: 14/12/22, MD  Internal Medicine Resident, PGY-1 [facility]   Pager: [phone] 2:51 PM, 04/06/2021   Please contact the on call pager after 5 pm and on weekends at [phone].

## 2021-04-07 NOTE — TOC Progression Note (Signed)
Transition of Care [facility]) - Progression Note    Patient Details  Name: Angela Medina MRN: [id number] Date of Birth: 16-Mar-1951  Transition of Care [facility]) CM/SW Contact  Jimmy Picket, [place] Phone Number: 04/07/2021, 3:39 PM  Clinical Narrative:     CSW gave pt additional shelter options including warming shelters. CSW informed pt that she will be discharged tomorrow. Pt stated no shes not leaving before Monday.   Expected Discharge Plan: Home/Self Care Barriers to Discharge: Continued Medical Work up  Expected Discharge Plan and Services Expected Discharge Plan: Home/Self Care       Living arrangements for the past 2 months: Homeless                                       Social Determinants of Health (SDOH) Interventions    Readmission Risk Interventions No flowsheet data found.  Jimmy Picket, LCSW Clinical Social Worker

## 2021-04-07 NOTE — Progress Notes (Signed)
HD#4 SUBJECTIVE:  Patient Summary: Angela Medina is a 70 y.o. with a pertinent PMH of schizoaffective disorder and depression, who presented with shaking rigors and bowel/bladder incontinence and admitted for community acquired pneumonia.   Overnight Events: no acute events overnight    Interm History: Patient seen and evaluated at bedside. S continues to complain about nasal congestion sputum production.  Continues to feel unwell and too weak to go home.  Discussed she has completed course of antibiotics for pneumonia and O2 sats are have been stable at room air.  Discussed that she is stable to discharge.  She feels that she is unable to go back to living on the streets and would like to talk to social work.  OBJECTIVE:  Vital Signs: Vitals:   04/06/21 1608 04/06/21 1949 04/07/21 0458 04/07/21 0736  BP: 116/71 (!) 127/99 (!) 156/79 117/63  Pulse: 67 89 69 70  Resp: 17 17 16 18   Temp: 98.5 F (36.9 C) 99.8 F (37.7 C) 98.9 F (37.2 C) 98.7 F (37.1 C)  TempSrc: Oral Oral Oral Oral  SpO2: 97% (!) 89% 100% 91%   Supplemental O2: Nasal Cannula SpO2: 91 % O2 Flow Rate (L/min): 2 L/min  There were no vitals filed for this visit.   Intake/Output Summary (Last 24 hours) at 04/07/2021 1109 Last data filed at 04/07/2021 04/09/2021 Gross per 24 hour  Intake 1080 ml  Output --  Net 1080 ml     Net IO Since Admission: 1,240 mL [04/07/21 1109]  Physical Exam: Physical Exam Constitutional:      General: She is not in acute distress.    Appearance: She is normal weight. She is not ill-appearing, toxic-appearing or diaphoretic.  HENT:     Head: Atraumatic.     Mouth/Throat:     Mouth: Mucous membranes are moist.     Pharynx: Oropharynx is clear.  Eyes:     Extraocular Movements: Extraocular movements intact.     Pupils: Pupils are equal, round, and reactive to light.  Cardiovascular:     Rate and Rhythm: Normal rate and regular rhythm.  Pulmonary:     Effort: Pulmonary  effort is normal. No respiratory distress.     Breath sounds: Normal breath sounds. No wheezing or rales.  Abdominal:     General: Abdomen is flat. Bowel sounds are normal.     Palpations: Abdomen is soft.  Musculoskeletal:        General: Normal range of motion.     Cervical back: Normal range of motion and neck supple.  Skin:    General: Skin is warm and dry.  Neurological:     General: No focal deficit present.     Mental Status: She is alert. Mental status is at baseline.  Psychiatric:        Behavior: Behavior normal.     Comments: Anxious, reserved    Pertinent Labs: CBC Latest Ref Rng & Units 04/06/2021 04/05/2021 04/04/2021  WBC 4.0 - 10.5 K/uL 8.4 9.6 9.4  Hemoglobin 12.0 - 15.0 g/dL 04/06/2021 93.8 18.2  Hematocrit 36.0 - 46.0 % 37.2 39.2 38.6  Platelets 150 - 400 K/uL 427(H) 418(H) 413(H)    CMP Latest Ref Rng & Units 04/06/2021 04/05/2021 04/04/2021  Glucose 70 - 99 mg/dL 89 97 04/06/2021)  BUN 8 - 23 mg/dL 18 14 8   Creatinine 0.44 - 1.00 mg/dL 716(R 6.78  Sodium 135 - 145 mmol/L 136 137 139  Potassium 3.5 - 5.1 mmol/L 4.3 4.3 3.4(L)  Chloride 98 - 111 mmol/L 95(L) 95(L) 97(L)  CO2 22 - 32 mmol/L 33(H) 34(H) 36(H)  Calcium 8.9 - 10.3 mg/dL 8.1(L) 8.4(L) 8.2(L)  Total Protein 6.5 - 8.1 g/dL - 5.9(L) 6.1(L)  Total Bilirubin 0.3 - 1.2 mg/dL - 0.4 0.3  Alkaline Phos 38 - 126 U/L - 147(H) 171(H)  AST 15 - 41 U/L - 30 55(H)  ALT 0 - 44 U/L - 50(H) 70(H)    No results for input(s): GLUCAP in the last 72 hours.    ASSESSMENT/PLAN:   Assessment: Principal Problem:   Bronchopneumonia Active Problems:   Schizoaffective disorder (HCC)   Acute hypoxemic respiratory failure (HCC)   Community acquired pneumonia  Angela Medina is a 70 y.o. with a pertinent PMH of schizoaffective disorder and depression, who presented with shaking rigors and bowel/bladder incontinence and admitted for community acquired pneumonia completed antibiotics.  Plan: #Community acquired  pneumonia - patient was recently admitted for treatment of her pneumonia. She was discharged on oral Cefdinir at her request, however, she was unable to complete outpatient therapy and was readmitted the same day due to her difficult living situation outside of the hospital.   -Completed course of antibiotics.  Has been afebrile with no leukocytosis.  Discussed with her that her cough and sputum production will likely persist as she recovers from pneumonia (12/11-16). TOC assisting with discharge planning as pt gets closer to completing treatment. -She continues to endorse concerns about her living situation at discharge.  Discussed with social work she has been resources for shelters at discharge encourage patient to call and make arrangements for discharge likely tomorrow.  #Schizoaffective disorder - mental status and mood appear stable at this time. She engages in exam and answers questions appropriately.  Has not required as needed Zyprexa in the last 2 days --Continue zyprexa 5mg  QHS and 2.5 mg BID PRN.  LT resolving. No reports of abd pain at this time. Will continue to monitor.   Best Practice: Diet: Regular diet VTE: enoxaparin (LOVENOX) injection 40 mg Start: 04/02/21 2200 Code: Full AB: None DISPO: Anticipated discharge in 1-2 days to Home pending Medical stability.  Signature: Iona Beard, MD  Internal Medicine Resident, PGY-2 [facility]  Pager: [phone] 11:09 AM, 04/07/2021   Please contact the on call pager after 5 pm and on weekends at [phone].

## 2021-04-08 NOTE — Progress Notes (Signed)
HD#5 SUBJECTIVE:  Patient Summary: Angela Medina is a 70 y.o. with a pertinent PMH of schizoaffective disorder and depression, who presented with shaking rigors and bowel/bladder incontinence and admitted for community acquired pneumonia.   Overnight Events: no acute events overnight    Interm History: Patient examined at bedside. States she is not feeling well. Drank some milk this morning with breakfast. Feels ill after drinking milk this morning. Now having more cough and nasal drainage, and does not feel ready for discharge. Social work spoke to her yesterday and gave her shelter information. She will call shelters today and to prepare for discharge tomorrow.  OBJECTIVE:  Vital Signs: Vitals:   04/07/21 0736 04/07/21 1607 04/07/21 2001 04/08/21 0409  BP: 117/63 132/87 131/64 133/70  Pulse: 70 62 80 65  Resp: 18 18 17 16   Temp: 98.7 F (37.1 C) 98.1 F (36.7 C) 98.8 F (37.1 C) 98.1 F (36.7 C)  TempSrc: Oral Oral Oral Oral  SpO2: 91% 91% 94% 90%    Physical Exam: Physical Exam Constitutional:      General: She is not in acute distress.    Appearance: She is normal weight.  HENT:     Head: Atraumatic.     Mouth/Throat:     Mouth: Mucous membranes are moist.     Pharynx: Oropharynx is clear. No oropharyngeal exudate.  Eyes:     Extraocular Movements: Extraocular movements intact.     Pupils: Pupils are equal, round, and reactive to light.  Cardiovascular:     Rate and Rhythm: Normal rate and regular rhythm.  Pulmonary:     Effort: Pulmonary effort is normal. No respiratory distress.     Breath sounds: Normal breath sounds. No wheezing or rales.  Abdominal:     General: Abdomen is flat. Bowel sounds are normal.     Palpations: Abdomen is soft.  Musculoskeletal:        General: Normal range of motion.     Cervical back: Normal range of motion and neck supple.  Skin:    General: Skin is warm.  Neurological:     General: No focal deficit present.     Mental  Status: She is alert. Mental status is at baseline.  Psychiatric:     Comments: Anxious, reserved    Pertinent Labs: CBC Latest Ref Rng & Units 04/06/2021 04/05/2021 04/04/2021  WBC 4.0 - 10.5 K/uL 8.4 9.6 9.4  Hemoglobin 12.0 - 15.0 g/dL 04/06/2021 30.8 65.7  Hematocrit 36.0 - 46.0 % 37.2 39.2 38.6  Platelets 150 - 400 K/uL 427(H) 418(H) 413(H)    CMP Latest Ref Rng & Units 04/06/2021 04/05/2021 04/04/2021  Glucose 70 - 99 mg/dL 89 97 04/06/2021)  BUN 8 - 23 mg/dL 18 14 8   Creatinine 0.44 - 1.00 mg/dL 962(X 5.28  Sodium 135 - 145 mmol/L 136 137 139  Potassium 3.5 - 5.1 mmol/L 4.3 4.3 3.4(L)  Chloride 98 - 111 mmol/L 95(L) 95(L) 97(L)  CO2 22 - 32 mmol/L 33(H) 34(H) 36(H)  Calcium 8.9 - 10.3 mg/dL 8.1(L) 8.4(L) 8.2(L)  Total Protein 6.5 - 8.1 g/dL - 5.9(L) 6.1(L)  Total Bilirubin 0.3 - 1.2 mg/dL - 0.4 0.3  Alkaline Phos 38 - 126 U/L - 147(H) 171(H)  AST 15 - 41 U/L - 30 55(H)  ALT 0 - 44 U/L - 50(H) 70(H)    No results for input(s): GLUCAP in the last 72 hours.    ASSESSMENT/PLAN:   Assessment: Principal Problem:   Bronchopneumonia Active  Problems:   Schizoaffective disorder ([place])   Acute hypoxemic respiratory failure ([place])   Community acquired pneumonia  Angela Medina is a 70 y.o. with a pertinent PMH of schizoaffective disorder and depression, who presented with shaking rigors and bowel/bladder incontinence and admitted for community acquired pneumonia completed antibiotics.  Plan: #Community acquired pneumonia - patient was recently admitted for treatment of her pneumonia. She was discharged on oral Cefdinir at her request, however, she was unable to complete outpatient therapy and was readmitted the same day due to her difficult living situation outside of the hospital.   -She has completed antibiotics and doing well. Leukocytosis resolved. And remain afebrile and stable on room air. Discussed with her that her cough and sputum production will likely persist as she  recovers from pneumonia -Discharge has been difficult as patient is unhomed -TOC consulted. She has been provided contact information for shelters. She will call shelters and plan for discharge tomorrow  #Schizoaffective disorder - mental status and mood appear stable at this time. She engages in exam and answers questions appropriately.  Has not required as needed Zyprexa in last few days --Continue zyprexa 5mg  QHS and 2.5 mg BID PRN.    Best Practice: Diet: Regular diet VTE: enoxaparin (LOVENOX) injection 40 mg Start: 04/02/21 2200 Code: Full AB: None DISPO: Anticipated discharge in 1 days to Home.  Signature: Iona Beard, MD  Internal Medicine Resident, PGY-2 [facility]  Pager: [phone] 6:59 AM, 04/08/2021   Please contact the on call pager after 5 pm and on weekends at [phone].

## 2021-04-08 NOTE — Plan of Care (Signed)
  Problem: Clinical Measurements: Goal: Will remain free from infection Outcome: Progressing   Problem: Clinical Measurements: Goal: Diagnostic test results will improve Outcome: Progressing   Problem: Clinical Measurements: Goal: Respiratory complications will improve Outcome: Progressing   Problem: Nutrition: Goal: Adequate nutrition will be maintained Outcome: Progressing   Problem: Coping: Goal: Level of anxiety will decrease Outcome: Progressing   

## 2021-04-09 ENCOUNTER — Other Ambulatory Visit (HOSPITAL_COMMUNITY): Payer: Self-pay

## 2021-04-09 MED ORDER — DOXYCYCLINE HYCLATE 100 MG PO TABS
100.0000 mg | ORAL_TABLET | Freq: Two times a day (BID) | ORAL | 0 refills | Status: AC
  Filled 2021-04-09: qty 14, 7d supply, fill #0

## 2021-04-09 NOTE — Progress Notes (Signed)
Discharge instructions (including medications) discussed with and copy provided to patient/caregiver 

## 2021-04-09 NOTE — Plan of Care (Signed)
  Problem: Education: Goal: Knowledge of General Education information will improve Description: Including pain rating scale, medication(s)/side effects and non-pharmacologic comfort measures Outcome: Adequate for Discharge   

## 2021-04-09 NOTE — Discharge Instructions (Addendum)
Dear Mrs. Lampi,  We treated you in the hospital for pneumonia. You completed a course of antibiotics for your pneumonia. We will also give you a course of Doxycycline for your sinus infection. Please take one tablets twice a day for 7 days. Please continue to take your Zyprexa.

## 2021-04-09 NOTE — Care Management Important Message (Signed)
Important Message  Patient Details  Name: Angela Medina MRN: [id number] Date of Birth: [birth year]   Medicare Important Message Given:  Yes     Sherilyn Banker 04/09/2021, 12:26 PM

## 2021-04-09 NOTE — Progress Notes (Signed)
PT AVS reviewed and pt verbalized understanding of all DC teaching. PT has all pt belongings in her possession and home meds have been returned. Pt leaving via taxi. IV removed.

## 2021-04-09 NOTE — TOC Progression Note (Addendum)
Transition of Care [facility]) - Progression Note    Patient Details  Name: Angela Medina MRN: [id number] Date of Birth: 02-Oct-1950  Transition of Care [facility]) CM/SW Contact  Jimmy Picket, [place] Phone Number: 04/09/2021, 2:55 PM  Clinical Narrative:     CSW received message that pt wanted to speak to social worker. CSW called pt and spoke to her by phone. Pt inquired about getting a [facility] card. CSW informed her that she will have to go to the social security building to do that herself. Pt requested to go to [facility] tomorrow. CSW informed pt that she can get a ride to [facility] today. Pt stated she was not leaving without a place to stay. CSW reminded pt that she was given shelter resources and warming centers. Pt stated she was not given resources. Pt stated she will be ready to go tomorrow. CSW explained that per saturdays conversation pt would DC Sunday or Monday. Pt stated she did not speak to social worker on Saturday. CSW inquired if pt still wanted ride to [facility]/ or [facility] pt began yelling at CSW and stated "I dont want to talk to any more got damn Black social workers".  CSW put taxi voucher on pts chart if she chooses to give nurse a legit address to DC to.   CSW will sign off at this time.   Expected Discharge Plan: Home/Self Care Barriers to Discharge: Continued Medical Work up  Expected Discharge Plan and Services Expected Discharge Plan: Home/Self Care       Living arrangements for the past 2 months: Homeless Expected Discharge Date: 04/09/21                                     Social Determinants of Health (SDOH) Interventions    Readmission Risk Interventions No flowsheet data found.  Jimmy Picket, LCSW Clinical Social Worker

## 2021-04-09 NOTE — Discharge Summary (Signed)
Name: Angela Medina MRN: [id number] DOB: 1951-01-10 70 y.o. PCP: Patient, No Pcp Per (Inactive)  Date of Admission: 04/02/2021  5:04 PM Date of Discharge: 04/09/21 Attending Physician: Dr. Oswaldo Done  Discharge Diagnosis: Principal Problem:   Bronchopneumonia Active Problems:   Schizoaffective disorder (HCC)   Acute hypoxemic respiratory failure (HCC)   Community acquired pneumonia    Discharge Medications: Allergies as of 04/09/2021       Reactions   Penicillins Other (See Comments)   Unknown- childhood allergy        Medication List     STOP taking these medications    azithromycin 500 MG tablet Commonly known as: Zithromax   cefdinir 300 MG capsule Commonly known as: OMNICEF       TAKE these medications    albuterol 108 (90 Base) MCG/ACT inhaler Commonly known as: VENTOLIN HFA Inhale 2 puffs into the lungs every 6 (six) hours as needed for wheezing or shortness of breath.   doxycycline 100 MG tablet Commonly known as: VIBRA-TABS Take 1 tablet (100 mg total) by mouth 2 (two) times daily for 7 days.   ibuprofen 200 MG tablet Commonly known as: ADVIL Take 200 mg by mouth every 6 (six) hours as needed for mild pain or moderate pain.   OLANZapine 5 MG tablet Commonly known as: ZYPREXA Take 1 tablet (5 mg total) by mouth at bedtime. What changed: Another medication with the same name was removed. Continue taking this medication, and follow the directions you see here.        Disposition and follow-up:   Ms.Camila Langston was discharged from [facility] in Stable condition.  At the hospital follow up visit please address:  1.  Follow-up:  a. Community acquired pneumonia    b. sinusitis   c. Schizoaffective disorder   d.  2.  Labs / imaging needed at time of follow-up: cbc, cmp  3.  Pending labs/ test needing follow-up: nonw  4.  Medication Changes  Started: doxycycline   Stopped: stop taking the Zyprexa 2.5  PRN  Changed:   Abx -  Doxycycline 100mg  PO BID End Date: 04/16/21  Follow-up Appointments: f/u PCP  Hospital Course by problem list:  # Community acquired pneumonia - patient had been initiated on oral cefdinir at previous admission. This medication was continued on admission. Leukocytosis and fevers improved. She was able to ambulate on RA and maintain adequate O2 saturations. She reported continued nasal discharge and was observed for some additional time, given her difficult housing situation. Nasal discharge persisted despite observation, she was given a course of oral doxycycline to take as an outpatient for suspected rhinosinusitis. TOC was consulted due to difficult housing situation and provided patient with information and resources for safe discharge.   # Schizoaffective disorder - patient was receiving Zyprexa 5mg  nightly and 2.5 BID from previous hospitalization. Mental status remained stable. She will be continued on her 5mg  Zyprexa nightly upon discharge.      Discharge Subjective: Reports continued sinus discharge. Eating and drinking well. No problems with going to the bathroom. Able to ambulate down the hall.   Discharge Exam:   BP 127/82 (BP Location: Right Arm)    Pulse 65    Temp 98 F (36.7 C) (Oral)    Resp 18    SpO2 94%  Constitutional:      General: She is not in acute distress.    Appearance: She is normal weight.  HENT:     Head: Atraumatic.  Mouth/Throat:     Mouth: Mucous membranes are moist.     Pharynx: Oropharynx is clear. No oropharyngeal exudate.  Eyes:     Extraocular Movements: Extraocular movements intact.     Pupils: Pupils are equal, round, and reactive to light.  Cardiovascular:     Rate and Rhythm: Normal rate and regular rhythm.  Pulmonary:     Effort: Pulmonary effort is normal. No respiratory distress.     Breath sounds: Normal breath sounds. No wheezing or rales.  Abdominal:     General: Abdomen is flat. Bowel sounds are normal.      Palpations: Abdomen is soft.  Musculoskeletal:        General: Normal range of motion.     Cervical back: Normal range of motion and neck supple.  Skin:    General: Skin is warm.  Neurological:     General: No focal deficit present.     Mental Status: She is alert. Mental status is at baseline.  Psychiatric:     Comments: Anxious, reserved  Pertinent Labs, Studies, and Procedures:  CBC Latest Ref Rng & Units 04/06/2021 04/05/2021 04/04/2021  WBC 4.0 - 10.5 K/uL 8.4 9.6 9.4  Hemoglobin 12.0 - 15.0 g/dL 12.2 12.7 12.4  Hematocrit 36.0 - 46.0 % 37.2 39.2 38.6  Platelets 150 - 400 K/uL 427(H) 418(H) 413(H)    CMP Latest Ref Rng & Units 04/06/2021 04/05/2021 04/04/2021  Glucose 70 - 99 mg/dL 89 97 107(H)  BUN 8 - 23 mg/dL 18 14 8   Creatinine 0.44 - 1.00 mg/dL 0.68 0.61 0.44  Sodium 135 - 145 mmol/L 136 137 139  Potassium 3.5 - 5.1 mmol/L 4.3 4.3 3.4(L)  Chloride 98 - 111 mmol/L 95(L) 95(L) 97(L)  CO2 22 - 32 mmol/L 33(H) 34(H) 36(H)  Calcium 8.9 - 10.3 mg/dL 8.1(L) 8.4(L) 8.2(L)  Total Protein 6.5 - 8.1 g/dL - 5.9(L) 6.1(L)  Total Bilirubin 0.3 - 1.2 mg/dL - 0.4 0.3  Alkaline Phos 38 - 126 U/L - 147(H) 171(H)  AST 15 - 41 U/L - 30 55(H)  ALT 0 - 44 U/L - 50(H) 70(H)    CT Head Wo Contrast  Result Date: 04/02/2021 CLINICAL DATA:  Trauma EXAM: CT HEAD WITHOUT CONTRAST CT CERVICAL SPINE WITHOUT CONTRAST TECHNIQUE: Multidetector CT imaging of the head and cervical spine was performed following the standard protocol without intravenous contrast. Multiplanar CT image reconstructions of the cervical spine were also generated. COMPARISON:  CT dated September 30, 2019 FINDINGS: CT HEAD FINDINGS Brain: Chronic white matter ischemic change. No evidence of acute infarction, hemorrhage, hydrocephalus, extra-axial collection or mass lesion/mass effect. Vascular: Coil pack noted near the expected area of the right internal carotid artery. Skull: Evaluation is somewhat limited due to motion  artifact, within limitations there is no evidence of acute fracture. Sinuses/Orbits: No acute finding. Other: None. CT CERVICAL SPINE FINDINGS Considerable exam limitations due to motion artifact. Alignment: Anterolisthesis of C3 on C4 and C4 on C5, likely degenerative given presence of associated severe facet arthropathy. Skull base and vertebrae: No definite acute fracture. No primary bone lesion or focal pathologic process. Soft tissues and spinal canal: No prevertebral fluid or swelling. No visible canal hematoma. Disc levels: Severe multilevel degenerative disc disease, most pronounced in the lower lumbar spine. Upper chest: Negative. Other: None. IMPRESSION: 1. No acute intracranial abnormality. 2. No definite evidence of acute skull or cervical spine fracture, although there are considerable exam limitations due to motion artifact. Repeat imaging should be considered when patient  is able to cooperate. 3. Anterolisthesis of C3 on C4 and C4 on C5, likely degenerative given presence of associated severe facet arthropathy. Electronically Signed   By: Yetta Glassman M.D.   On: 04/02/2021 19:00   CT Cervical Spine Wo Contrast  Result Date: 04/02/2021 CLINICAL DATA:  Trauma EXAM: CT HEAD WITHOUT CONTRAST CT CERVICAL SPINE WITHOUT CONTRAST TECHNIQUE: Multidetector CT imaging of the head and cervical spine was performed following the standard protocol without intravenous contrast. Multiplanar CT image reconstructions of the cervical spine were also generated. COMPARISON:  CT dated September 30, 2019 FINDINGS: CT HEAD FINDINGS Brain: Chronic white matter ischemic change. No evidence of acute infarction, hemorrhage, hydrocephalus, extra-axial collection or mass lesion/mass effect. Vascular: Coil pack noted near the expected area of the right internal carotid artery. Skull: Evaluation is somewhat limited due to motion artifact, within limitations there is no evidence of acute fracture. Sinuses/Orbits: No acute finding.  Other: None. CT CERVICAL SPINE FINDINGS Considerable exam limitations due to motion artifact. Alignment: Anterolisthesis of C3 on C4 and C4 on C5, likely degenerative given presence of associated severe facet arthropathy. Skull base and vertebrae: No definite acute fracture. No primary bone lesion or focal pathologic process. Soft tissues and spinal canal: No prevertebral fluid or swelling. No visible canal hematoma. Disc levels: Severe multilevel degenerative disc disease, most pronounced in the lower lumbar spine. Upper chest: Negative. Other: None. IMPRESSION: 1. No acute intracranial abnormality. 2. No definite evidence of acute skull or cervical spine fracture, although there are considerable exam limitations due to motion artifact. Repeat imaging should be considered when patient is able to cooperate. 3. Anterolisthesis of C3 on C4 and C4 on C5, likely degenerative given presence of associated severe facet arthropathy. Electronically Signed   By: Yetta Glassman M.D.   On: 04/02/2021 19:00   DG CHEST PORT 1 VIEW  Result Date: 04/02/2021 CLINICAL DATA:  Agitated EXAM: PORTABLE CHEST 1 VIEW COMPARISON:  03/31/2021 FINDINGS: Mild cardiomegaly. Linear atelectasis at the left base. No consolidation, pleural effusion or pneumothorax. Aortic atherosclerosis. IMPRESSION: Cardiomegaly with subsegmental atelectasis at the left base Electronically Signed   By: Donavan Foil M.D.   On: 04/02/2021 22:11     Discharge Instructions: Discharge Instructions     Call MD for:  difficulty breathing, headache or visual disturbances   Complete by: As directed    Call MD for:  extreme fatigue   Complete by: As directed    Call MD for:  hives   Complete by: As directed    Call MD for:  persistant dizziness or light-headedness   Complete by: As directed    Call MD for:  persistant nausea and vomiting   Complete by: As directed    Call MD for:  redness, tenderness, or signs of infection (pain, swelling, redness,  odor or green/yellow discharge around incision site)   Complete by: As directed    Call MD for:  severe uncontrolled pain   Complete by: As directed    Call MD for:  temperature >100.4   Complete by: As directed    Diet - low sodium heart healthy   Complete by: As directed    Diet - low sodium heart healthy   Complete by: As directed    Increase activity slowly   Complete by: As directed    Increase activity slowly   Complete by: As directed       We treated you in the hospital for pneumonia. You completed a course of antibiotics  for your pneumonia. We will also give you a course of Doxycycline for your sinus infection. Please take one tablets twice a day for 7 days. Please continue to take your Zyprexa.   Signed: Delene Ruffini, MD 04/09/2021, 3:05 PM   Pager: [phone]

## 2021-04-10 ENCOUNTER — Other Ambulatory Visit (HOSPITAL_COMMUNITY): Payer: Self-pay

## 2021-04-10 ENCOUNTER — Emergency Department (HOSPITAL_COMMUNITY): Payer: Medicare Other

## 2021-04-10 ENCOUNTER — Emergency Department (HOSPITAL_COMMUNITY)
Admission: EM | Admit: 2021-04-10 | Discharge: 2021-04-11 | Disposition: A | Discharge status: Home / Self Care | Payer: Medicare Other | Attending: Emergency Medicine | Admitting: Emergency Medicine

## 2021-04-10 DIAGNOSIS — R0602 Shortness of breath: Secondary | ICD-10-CM | POA: Diagnosis not present

## 2021-04-10 DIAGNOSIS — F17210 Nicotine dependence, cigarettes, uncomplicated: Secondary | ICD-10-CM | POA: Insufficient documentation

## 2021-04-10 DIAGNOSIS — F209 Schizophrenia, unspecified: Secondary | ICD-10-CM | POA: Diagnosis not present

## 2021-04-10 DIAGNOSIS — R059 Cough, unspecified: Secondary | ICD-10-CM | POA: Diagnosis not present

## 2021-04-10 DIAGNOSIS — Z20822 Contact with and (suspected) exposure to covid-19: Secondary | ICD-10-CM | POA: Diagnosis not present

## 2021-04-10 DIAGNOSIS — Z5900 Homelessness unspecified: Secondary | ICD-10-CM

## 2021-04-10 LAB — CBC WITH DIFFERENTIAL/PLATELET
Abs Immature Granulocytes: 0.1 10*3/uL — ABNORMAL HIGH (ref 0.00–0.07)
Basophils Absolute: 0.1 10*3/uL (ref 0.0–0.1)
Basophils Relative: 1 %
Eosinophils Absolute: 0.2 10*3/uL (ref 0.0–0.5)
Eosinophils Relative: 2 %
HCT: 39.5 % (ref 36.0–46.0)
Hemoglobin: 12.5 g/dL (ref 12.0–15.0)
Lymphocytes Relative: 35 %
Lymphs Abs: 3.2 10*3/uL (ref 0.7–4.0)
MCH: 31.7 pg (ref 26.0–34.0)
MCHC: 31.6 g/dL (ref 30.0–36.0)
MCV: 100.3 fL — ABNORMAL HIGH (ref 80.0–100.0)
Monocytes Absolute: 0.9 10*3/uL (ref 0.1–1.0)
Monocytes Relative: 10 %
Neutro Abs: 4.6 10*3/uL (ref 1.7–7.7)
Neutrophils Relative %: 51 %
Platelets: 412 10*3/uL — ABNORMAL HIGH (ref 150–400)
Promyelocytes Relative: 1 %
RBC: 3.94 MIL/uL (ref 3.87–5.11)
RDW: 12.5 % (ref 11.5–15.5)
WBC: 9 10*3/uL (ref 4.0–10.5)
nRBC: 0 % (ref 0.0–0.2)
nRBC: 0 /100 WBC

## 2021-04-10 LAB — BASIC METABOLIC PANEL
Anion gap: 10 (ref 5–15)
BUN: 32 mg/dL — ABNORMAL HIGH (ref 8–23)
CO2: 31 mmol/L (ref 22–32)
Calcium: 8.5 mg/dL — ABNORMAL LOW (ref 8.9–10.3)
Chloride: 95 mmol/L — ABNORMAL LOW (ref 98–111)
Creatinine, Ser: 0.97 mg/dL (ref 0.44–1.00)
GFR, Estimated: >60 mL/min (ref >60)
Glucose, Bld: 119 mg/dL — ABNORMAL HIGH (ref 70–99)
Potassium: 4.1 mmol/L (ref 3.5–5.1)
Sodium: 136 mmol/L (ref 135–145)

## 2021-04-10 LAB — BRAIN NATRIURETIC PEPTIDE: B Natriuretic Peptide: 302.8 pg/mL — ABNORMAL HIGH (ref 0.0–100.0)

## 2021-04-10 LAB — LACTIC ACID, PLASMA: Lactic Acid, Venous: 2.2 mmol/L (ref 0.5–1.9)

## 2021-04-10 MED ORDER — SODIUM CHLORIDE 0.9 % IV BOLUS
1000.0000 mL | Freq: Once | INTRAVENOUS | Status: AC
  Administered 2021-04-11: 03:00:00 1000 mL via INTRAVENOUS

## 2021-04-10 NOTE — ED Provider Notes (Signed)
Emergency Medicine Provider Triage Evaluation Note  Angela Medina , a 69 y.o. female  was evaluated in triage.  Pt complains of cough and SOB.  Was just discharged from hospital after having been admitted for pneumonia.  Hasn't started taking her meds.  Is homeless.  States she needs more work done with her.  Review of Systems  Positive: Cough, sob Negative: Fever, chills  Physical Exam  There were no vitals taken for this visit. Gen:   Awake, no distress   Resp:  Normal effort  MSK:   Moves extremities without difficulty  Other:    Medical Decision Making  Medically screening exam initiated at 10:33 PM.  Appropriate orders placed.  Larraine Argo was informed that the remainder of the evaluation will be completed by another provider, this initial triage assessment does not replace that evaluation, and the importance of remaining in the ED until their evaluation is complete.  SOB   Roxy Horseman, PA-C 04/10/21 2234    Tegeler, Canary Brim, MD 04/10/21 [phone]
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
## 2021-04-11 ENCOUNTER — Other Ambulatory Visit: Payer: Self-pay

## 2021-04-11 DIAGNOSIS — Z5900 Homelessness unspecified: Secondary | ICD-10-CM

## 2021-04-11 DIAGNOSIS — F209 Schizophrenia, unspecified: Secondary | ICD-10-CM

## 2021-04-11 DIAGNOSIS — R0602 Shortness of breath: Secondary | ICD-10-CM

## 2021-04-11 LAB — BASIC METABOLIC PANEL
Anion gap: 9 (ref 5–15)
BUN: 32 mg/dL — ABNORMAL HIGH (ref 8–23)
CO2: 30 mmol/L (ref 22–32)
Calcium: 8.1 mg/dL — ABNORMAL LOW (ref 8.9–10.3)
Chloride: 97 mmol/L — ABNORMAL LOW (ref 98–111)
Creatinine, Ser: 0.84 mg/dL (ref 0.44–1.00)
GFR, Estimated: >60 mL/min (ref >60)
Glucose, Bld: 114 mg/dL — ABNORMAL HIGH (ref 70–99)
Potassium: 3.9 mmol/L (ref 3.5–5.1)
Sodium: 136 mmol/L (ref 135–145)

## 2021-04-11 LAB — RESP PANEL BY RT-PCR (FLU A&B, COVID) ARPGX2
Influenza A by PCR: NEGATIVE
Influenza B by PCR: NEGATIVE
SARS Coronavirus 2 by RT PCR: NEGATIVE

## 2021-04-11 LAB — LACTIC ACID, PLASMA: Lactic Acid, Venous: 1.4 mmol/L (ref 0.5–1.9)

## 2021-04-11 MED ORDER — ACETAMINOPHEN 650 MG RE SUPP: 650.0000 mg | Freq: Four times a day (QID) | RECTAL | Status: DC | PRN

## 2021-04-11 MED ORDER — ENSURE ENLIVE PO LIQD
237.0000 mL | Freq: Two times a day (BID) | ORAL | Status: DC
  Filled 2021-04-11: qty 237

## 2021-04-11 MED ORDER — ENOXAPARIN SODIUM 40 MG/0.4ML IJ SOSY
40.0000 mg | PREFILLED_SYRINGE | INTRAMUSCULAR | Status: DC
  Administered 2021-04-11: 09:00:00 40 mg via SUBCUTANEOUS
  Filled 2021-04-11: qty 0.4

## 2021-04-11 MED ORDER — ALBUTEROL SULFATE (2.5 MG/3ML) 0.083% IN NEBU: 3.0000 mL | INHALATION_SOLUTION | Freq: Four times a day (QID) | RESPIRATORY_TRACT | Status: DC | PRN

## 2021-04-11 MED ORDER — DOXYCYCLINE HYCLATE 100 MG PO TABS
100.0000 mg | ORAL_TABLET | Freq: Two times a day (BID) | ORAL | Status: DC
  Administered 2021-04-11: 09:00:00 100 mg via ORAL
  Filled 2021-04-11: qty 1

## 2021-04-11 MED ORDER — BENZONATATE 100 MG PO CAPS: 100.0000 mg | ORAL_CAPSULE | Freq: Three times a day (TID) | ORAL | Status: DC | PRN

## 2021-04-11 MED ORDER — ACETAMINOPHEN 325 MG PO TABS: 650.0000 mg | ORAL_TABLET | Freq: Four times a day (QID) | ORAL | Status: DC | PRN

## 2021-04-11 MED ORDER — OLANZAPINE 5 MG PO TABS
5.0000 mg | ORAL_TABLET | Freq: Every day | ORAL | Status: DC
  Filled 2021-04-11: qty 1

## 2021-04-11 MED ORDER — BENZONATATE 100 MG PO CAPS
100.0000 mg | ORAL_CAPSULE | Freq: Once | ORAL | Status: AC
  Administered 2021-04-11: 03:00:00 100 mg via ORAL
  Filled 2021-04-11: qty 1

## 2021-04-11 NOTE — ED Notes (Signed)
Pt educated on using call bell when she needed to go to the restroom. Floor cleaned at this time. Pt adjusted in the bed

## 2021-04-11 NOTE — ED Notes (Signed)
Lab at bedside

## 2021-04-11 NOTE — ED Notes (Signed)
Pt escorted out by security

## 2021-04-11 NOTE — H&P (Signed)
Date: 04/11/2021               Patient Name:  Angela Medina MRN: [id number]  DOB: [birth year] Age / Sex: 70 y.o., female   PCP: Patient, No Pcp Per (Inactive)         Medical Service: Internal Medicine Teaching Service         Attending Physician: Dr. Oswaldo Done, Marquita Palms, *    First Contact: Dr. Burnice Logan  Pager: [phone]  Second Contact: Dr. Elaina Pattee  Pager: [phone]       After Hours (After 5p/  First Contact Pager: [phone]  weekends / holidays): Second Contact Pager: [phone]   Chief Complaint: cough, lethargy  History of Present Illness:  Angela Medina is a 69 y.o. with a pertinent PMH of chronic homelessness, schizoaffective disorder and depression who presents with cough and lethargy.   Patient reports that after being discharged from the hospital for CAP she was unable to get to a shelter due to the cost of getting there. As a result she was out on the streets. She states that she had a cough that was productive of gray/yellowish sputum. She denies fevers or chills, wheezing. She does note that she has not been able to eat or drink much and this has caused her to have decreased energy levels. She denies difficulty with urinating or stooling.  Patient states that she would like a couple of days to get back to herself.    Meds:   Zyprexa Albuterol    Allergies: Allergies as of 04/10/2021 - Review Complete 04/10/2021  Allergen Reaction Noted   Penicillins Other (See Comments) 09/30/2019   Past Medical History:  Diagnosis Date   Aneurysm (HCC)    Depression    Schizoaffective disorder (HCC)    Vision changes    right eye    Family History: No pertinent family history   Social History:   Patient is currently un-homed. Current tobacco use. Unknown drug use status.    Review of Systems: A complete ROS was negative except as per HPI.   Physical Exam: Blood pressure 111/76, pulse 61, temperature 98.1 F (36.7 C), temperature source Oral, resp. rate 18,  height 5' (1.524 m), weight 61.2 kg, SpO2 93 %.  Constitutional:      General: She is drowsy but easily awakens to voice.    Comments:  HENT:     Head: Normocephalic and atraumatic. Dry mucous membranes  Cardiovascular:     Rate and Rhythm: Normal rate and regular rhythm.     Heart sounds: Normal heart sounds.  Pulmonary:     Breath sounds: Non labored breathing on room air. No wheezing or crackles.  Musculoskeletal:     Right lower leg: No edema.     Left lower leg: No edema.  Skin:    General: Skin is warm and dry.     Comments: Dirt collection under finger nails; Neurological:     General: No focal deficit present.     Mental Status: She is oriented to person, place, and time.  Psychiatric:        Speech: Speech is slurred.        Behavior: Behavior is cooperative.        Thought Content: Thought content is not paranoid or delusional.         EKG: personally reviewed my interpretation is sinus rhythm   CXR: personally reviewed my interpretation is no evidence of pneumonia   Assessment & Plan by Problem:  Principal Problem:   Homelessness unspecified  #Recently treated CAP #Cough Patient reports increased cough and sputum production since being discharged. She also notes some lethargy but attributes this to not being able to access food and water. On exam patient is breathing comfortably not tachypneic without wheezing or rales. She has no leukocytosis. CXR is not concerning for pneumonia. Low suspicion for infectious etiology of patients cough. Suspect this is likely due to environmental trigger as patient remains unhomed. Low concern for cardiac etiology or pe.  -Continue antibiotic patient was discharged on  -Tessalon perles as needed   #Schizoaffective disorder Continue zyprexa dose that patient was discharged on.    #Chronically unhomed  Given resources during most recent hospitalization [facility] Patient reports she was unable to find  affordable transportation to that facility.  -F/u with CM   Best Practice: Diet: Regular diet VTE: enoxaparin (LOVENOX) injection 40 mg Start: 04/02/21 2200 Code: Full AB: None Dispo: Admit patient to Observation with expected length of stay less than 2 midnights.  Signed: Marolyn Haller, MD 04/11/2021, 3:56 AM   After 5pm on weekdays and 1pm on weekends: On Call pager: [phone]

## 2021-04-11 NOTE — ED Provider Notes (Signed)
[facility] EMERGENCY DEPARTMENT Provider Note   CSN: [id number] Arrival date & time: 04/10/21  2228     History Chief Complaint  Patient presents with   Shortness of Breath    Angela Medina is a 70 y.o. female.  The history is provided by the patient and medical records. No language interpreter was used.  Shortness of Breath  70 year old female significant history of homelessness, schizoaffective disorder, depression, who recently was hospitalized for bronchopneumonia and discharged 2 days ago presenting complaining of shortness of breath.  Patient initially seen on 12/12, and was hospitalized until 12/19 due to community-acquired pneumonia as well as lack of housing and poorly controlled mental illness.  She was discharged 2 days ago.  Patient states she has 2 different types of antibiotic but does not know which 1 to take whether will be doxycycline or azithromycin.  Furthermore, patient states she is very weak, has persistent cough, is hungry, and have not found a place to stay yet.  She feels that she needs some more assistance with her condition.  She does not complain of fever or chills no nausea vomiting or diarrhea but does endorse persistent cough and increased shortness of breath.  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     No family history on file.  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 Authorizing Provider  albuterol (VENTOLIN HFA) 108 (90 Base) MCG/ACT inhaler Inhale 2 puffs into the lungs every 6 (six) hours as needed for wheezing or shortness of breath. 04/02/21   Elige Radon, MD  doxycycline (VIBRA-TABS) 100 MG tablet Take 1 tablet (100 mg total) by mouth 2 (two) times daily for 7 days. 04/09/21 04/16/21  Adron Bene, MD  ibuprofen (ADVIL) 200 MG tablet Take 200 mg by mouth every 6 (six) hours as needed for mild pain or moderate pain.    [provider]  OLANZapine (ZYPREXA) 5 MG tablet Take 1 tablet (5 mg total) by mouth at bedtime. 04/02/21   Elige Radon, MD    Allergies    Penicillins  Review of Systems   Review of Systems  Respiratory:  Positive for shortness of breath.   All other systems reviewed and are negative.  Physical Exam Updated Vital Signs BP 93/75 (BP Location: Right Arm)    Pulse 66    Temp 98.1 F (36.7 C) (Oral)    Resp 17    SpO2 96%   Physical Exam Vitals and nursing note reviewed.  Constitutional:      General: She is not in acute distress.    Appearance: She is well-developed.  Comments: Patient appears unkept, but nontoxic  HENT:     Head: Atraumatic.  Eyes:     Conjunctiva/sclera: Conjunctivae normal.  Cardiovascular:     Rate and Rhythm: Normal rate and regular rhythm.  Pulmonary:     Effort: Pulmonary effort is normal.     Breath sounds: Rhonchi present. No decreased breath sounds, wheezing or rales.  Chest:     Chest wall: No tenderness.  Abdominal:     Tenderness: There is no abdominal tenderness.  Musculoskeletal:     Cervical back: Neck supple.     Right lower leg: No edema.     Left lower leg: No edema.  Skin:    Findings: No rash.  Neurological:     Mental Status: She is  alert.  Psychiatric:        Mood and Affect: Mood normal.    ED Results / Procedures / Treatments   Labs (all labs ordered are listed, but only abnormal results are displayed) Labs Reviewed  CBC WITH DIFFERENTIAL/PLATELET - Abnormal; Notable for the following components:      Result Value   MCV 100.3 (*)    Platelets 412 (*)    Abs Immature Granulocytes 0.10 (*)    All other components within normal limits  BASIC METABOLIC PANEL - Abnormal; Notable for the following components:   Chloride 95 (*)    Glucose, Bld 119 (*)    BUN 32 (*)    Calcium 8.5 (*)    All other components within normal limits  LACTIC ACID, PLASMA - Abnormal; Notable for the following components:   Lactic Acid, Venous 2.2 (*)    All other components within normal limits  BRAIN NATRIURETIC PEPTIDE - Abnormal; Notable for the following components:   B Natriuretic Peptide 302.8 (*)    All other components within normal limits  LACTIC ACID, PLASMA    EKG None  Radiology DG Chest 2 View  Result Date: 04/10/2021 CLINICAL DATA:  Cough, dyspnea EXAM: CHEST - 2 VIEW COMPARISON:  04/02/2021 FINDINGS: Minimal left basilar atelectasis. Eventration of the left hemidiaphragm. Lungs are otherwise clear. No pneumothorax or pleural effusion. Cardiac size within normal limits. Pulmonary vascularity is normal. Degenerative changes are noted within the shoulders bilaterally, left greater than right. Severe midthoracic compression deformities unchanged from prior CT examination of 04/01/2021. IMPRESSION: No active cardiopulmonary disease. Electronically Signed   By: Helyn Numbers M.D.   On: 04/10/2021 23:38    Procedures Procedures   Medications Ordered in ED Medications  sodium chloride 0.9 % bolus 1,000 mL (has no administration in time range)    ED Course  I have reviewed the triage vital signs and the nursing notes.  Pertinent labs & imaging results that were available during my care of the patient were reviewed  by me and considered in my medical decision making (see chart for details).    MDM Rules/Calculators/A&P                         BP 93/75 (BP Location: Right Arm)    Pulse 66    Temp 98.1 F (36.7 C) (Oral)    Resp 17    SpO2 96%      Final Clinical Impression(s) / ED Diagnoses Final diagnoses:  Shortness of breath    Rx / DC Orders ED Discharge Orders     None      2:58 AM Patient is homeless, previously admitted to the hospital for 7  days due to bronchopneumonia, discharged home with prescription for doxycycline.  She have not been taking her antibiotic and report symptom has not improved much.  She request for further treatment of her condition.  States she still having shortness of breath as well as persistent cough.  X-ray of her chest without any acute finding, BNP is mildly elevated at 302, lactic acid is elevated at 2.2, her white count is 9.  Plan to consult internal medicine for recommendation.  3:09 AM Appreciate consultation from Internal Medicine resident who agrees to see pt in the ER and will help determine disposition. Care discussed with Dr. Adela Lank.    Fayrene Helper, PA-C 04/11/21 0358    Melene Plan, DO 04/11/21 [phone]

## 2021-04-11 NOTE — ED Notes (Signed)
Found pt sitting at foot of bed. Attempted to get pt back in bed. Pt asked for something to eat a sandwich. Advised pt that we didn't have any we have given her what we have. Pt started yelling at me and using foul language with me. Pt adjusted in bed and at this time apologized for yelling and the foul language. Pt then asked for a sandwich again pt advised again we didn't have any pt again started yelling and using foul language. Pt advised that she was not going to speak with me that way and walked out of the room

## 2021-04-11 NOTE — ED Notes (Signed)
Called to room and pt requesting some crackers, peanut butter, and drink. Apologizes for previously. Advise pt I could bring her that and pt starts yelling and using foul language and make a jerking off motion at this RN. Pt advised again that she was not going to speak or treat this RN that way

## 2021-04-11 NOTE — ED Notes (Signed)
Called [name], pt's emergency contact to try to notify pt that her meds and belongings bag were found at the bus stop. Number went to a NP. Voicemail not left.

## 2021-04-11 NOTE — Progress Notes (Signed)
CSW provided patient with the shelter resources. Patient stated that its too cold outside and she has no place to go and has no phone to call any of these papers. Patient then stated and your going to bring me these "Shit pieces of papers and I don't even have a way to get to these shelters" Patient slammed the papers down on her bed. CSW told patient if she would've let CSW finish what she was saying that she would have been informed about the room phone located behind her to use to call. CSW also told patient if she calmed down and let CSW explain that if she finds a shelter that CSW can provide a ride there. Patient apologized and then started yelling about the "[employer]" who took all her belongings. CSW handed patient the room phone for her to start calling shelters. CSW provided patients nurse with a bus pass.

## 2021-04-11 NOTE — Discharge Summary (Signed)
Name: Angela Medina MRN: [id number] DOB: Apr 20, 1951 70 y.o. PCP: Patient, No Pcp Per (Inactive)  Date of Admission: 04/10/2021 10:28 PM Date of Discharge: 04/11/21 Attending Physician: Dr. Oswaldo Done  Discharge Diagnosis: Principal Problem:   Homelessness unspecified    Discharge Medications: Allergies as of 04/11/2021       Reactions   Penicillins Other (See Comments)   Unknown- childhood allergy        Medication List     TAKE these medications    albuterol 108 (90 Base) MCG/ACT inhaler Commonly known as: VENTOLIN HFA Inhale 2 puffs into the lungs every 6 (six) hours as needed for wheezing or shortness of breath.   doxycycline 100 MG tablet Commonly known as: VIBRA-TABS Take 1 tablet (100 mg total) by mouth 2 (two) times daily for 7 days.   ibuprofen 200 MG tablet Commonly known as: ADVIL Take 200 mg by mouth every 6 (six) hours as needed for mild pain or moderate pain.   OLANZapine 5 MG tablet Commonly known as: ZYPREXA Take 1 tablet (5 mg total) by mouth at bedtime.        Disposition and follow-up:   [name] was [facility] from [facility] in Stable condition.  At the hospital follow up visit please address:  1.  Follow-up:  a. Communnity acquired pneumonia with sinusitis - continue oral antibiotic     b. Schizoaffective disorder   c.   d.  2.  Labs / imaging needed at time of follow-up: cbc, cmp  3.  Pending labs/ test needing follow-up: none  4.  Medication Changes  Started: none  Stopped: none  Changed: none  Abx -  Doxycycline 100mg  PO BID End Date: 04/16/21  Follow-up Appointments: behavioral health PCP  Hospital Course by problem list:   # Community acquired pneumonia # difficult living situation Patient was recently discharged from the hospital after completing a course of oral cefdinir for community acquired pneumonia. At discharge last hospitalization she complained of continued nasal secretions,  but her pneumonia had improved with clear CXR, no SOB, good O2 sats, no fever and no leukocytosis. She was discharged with a course of oral doxycycline as well as resources for shelters and transportation as last discharge as well. She represented to the ED complaining of continued nasal discharge and inability to find shelters. Evaluation in the ED showed no acute cardiopulmonary abnormality on imaging, her labwork was within normal limits, and she appeared clinically well. She remained stable and TOC was consulted to assist with her social difficulties. They provided with resources to utilize upon discharge for transportation and shelters. Instructed to continue oral antibiotics that were provided to her at last hospitalization and follow up with behavioral health and PCP.  # Schizoaffective disorder - patient was continued on Zyprexa and her mental status remained stable through hospitalization.    No notes on file   Discharge Subjective: Patient reports improvement in cough, no shortness of breath. Denies fever, chills. No nausea, vomiting, diarrhea. No pain.   Discharge Exam:   BP 120/64    Pulse (!) 53    Temp 98.5 F (36.9 C) (Oral)    Resp 16    Ht 5' (1.524 m)    Wt 61.2 kg    SpO2 92%    BMI 26.37 kg/m  Constitutional:      General: She is not in acute distress.    Appearance: She is normal weight.  HENT:     Head: Atraumatic.  Mouth/Throat:     Mouth: Mucous membranes are moist.     Pharynx: Oropharynx is clear. No oropharyngeal exudate.  Eyes:     Extraocular Movements: Extraocular movements intact.     Pupils: Pupils are equal, round, and reactive to light.  Cardiovascular:     Rate and Rhythm: Normal rate and regular rhythm.  Pulmonary:     Effort: Pulmonary effort is normal. No respiratory distress.     Breath sounds: Normal breath sounds. No wheezing or rales.  Abdominal:     General: Abdomen is flat. Bowel sounds are normal.     Palpations: Abdomen is soft.   Musculoskeletal:        General: Normal range of motion.     Cervical back: Normal range of motion and neck supple.  Skin:    General: Skin is warm.  Neurological:     General: No focal deficit present.     Mental Status: She is alert. Mental status is at baseline.  Psychiatric:     Comments: Anxious, reserved  Pertinent Labs, Studies, and Procedures:  CBC Latest Ref Rng & Units 04/10/2021 04/06/2021 04/05/2021  WBC 4.0 - 10.5 K/uL 9.0 8.4 9.6  Hemoglobin 12.0 - 15.0 g/dL 12.5 12.2 12.7  Hematocrit 36.0 - 46.0 % 39.5 37.2 39.2  Platelets 150 - 400 K/uL 412(H) 427(H) 418(H)    CMP Latest Ref Rng & Units 04/11/2021 04/10/2021 04/06/2021  Glucose 70 - 99 mg/dL 114(H) 119(H) 89  BUN 8 - 23 mg/dL 32(H) 32(H) 18  Creatinine 0.44 - 1.00 mg/dL 0.84 0.97 0.68  Sodium 135 - 145 mmol/L 136 136 136  Potassium 3.5 - 5.1 mmol/L 3.9 4.1 4.3  Chloride 98 - 111 mmol/L 97(L) 95(L) 95(L)  CO2 22 - 32 mmol/L 30 31 33(H)  Calcium 8.9 - 10.3 mg/dL 8.1(L) 8.5(L) 8.1(L)  Total Protein 6.5 - 8.1 g/dL - - -  Total Bilirubin 0.3 - 1.2 mg/dL - - -  Alkaline Phos 38 - 126 U/L - - -  AST 15 - 41 U/L - - -  ALT 0 - 44 U/L - - -    DG Chest 2 View  Result Date: 04/10/2021 CLINICAL DATA:  Cough, dyspnea EXAM: CHEST - 2 VIEW COMPARISON:  04/02/2021 FINDINGS: Minimal left basilar atelectasis. Eventration of the left hemidiaphragm. Lungs are otherwise clear. No pneumothorax or pleural effusion. Cardiac size within normal limits. Pulmonary vascularity is normal. Degenerative changes are noted within the shoulders bilaterally, left greater than right. Severe midthoracic compression deformities unchanged from prior CT examination of 04/01/2021. IMPRESSION: No active cardiopulmonary disease. Electronically Signed   By: Fidela Salisbury M.D.   On: 04/10/2021 23:38     Discharge Instructions: Discharge Instructions     Call MD for:  difficulty breathing, headache or visual disturbances   Complete by: As directed     Call MD for:  extreme fatigue   Complete by: As directed    Call MD for:  hives   Complete by: As directed    Call MD for:  persistant dizziness or light-headedness   Complete by: As directed    Call MD for:  persistant nausea and vomiting   Complete by: As directed    Call MD for:  redness, tenderness, or signs of infection (pain, swelling, redness, odor or green/yellow discharge around incision site)   Complete by: As directed    Call MD for:  severe uncontrolled pain   Complete by: As directed    Call MD for:  temperature >100.4   Complete by: As directed    Diet - low sodium heart healthy   Complete by: As directed    Increase activity slowly   Complete by: As directed      We evaluated you in the hospital for your congestions. Your symptoms appear to be well controlled. Imaging of your chest and labwork show no concern for infection currently. We have asked our social workers to assist you upon discharge. Please follow up with your primary care doctor in approximately 1 week. Please continue to take your Zyprexa. Please finish the course of your antibiotic that you were given last time.   Signed: Adron Bene, MD 04/11/2021, 2:49 PM   Pager: [phone]

## 2021-04-11 NOTE — ED Notes (Signed)
Pt belongings locked up w/ security

## 2021-04-11 NOTE — Discharge Instructions (Addendum)
Dear Mrs. Delorse Lek,  We evaluated you in the hospital for your congestions. Your symptoms appear to be well controlled. Imaging of your chest and labwork show no concern for infection currently. We have asked our social workers to assist you upon discharge. Please follow up with your primary care doctor in approximately 1 week. Please continue to take your Zyprexa. Please finish the course of your antibiotic that you were given last time.

## 2021-04-11 NOTE — ED Notes (Signed)
Admit provider at bedside 

## 2021-04-11 NOTE — Progress Notes (Signed)
CSW was informed that patient will be discharging. CSW spoke to patient and she stated she is homeless. CSW offered patient the shelter resource list and encouraged her to call some shelters to see if they and any openings. Patient has a room phone. CSW also stated she can provide a bus pass at discharge. Patient denied having any friends or family for support.

## 2021-04-11 NOTE — ED Notes (Signed)
Pt sitting at the foot of her bed. At this time she stated that she had to go to the restroom and she used the restroom on the floor beside her bed and she wants someone to clean it up. Pt never used call bell for help. When last checked on pt she was laying in the bed asleep

## 2021-04-12 ENCOUNTER — Emergency Department (HOSPITAL_COMMUNITY)
Admission: EM | Admit: 2021-04-12 | Discharge: 2021-04-12 | Disposition: A | Discharge status: Home / Self Care | Payer: Medicare Other | Attending: Emergency Medicine | Admitting: Emergency Medicine

## 2021-04-12 ENCOUNTER — Other Ambulatory Visit: Payer: Self-pay

## 2021-04-12 ENCOUNTER — Emergency Department (HOSPITAL_COMMUNITY): Payer: Medicare Other

## 2021-04-12 DIAGNOSIS — R059 Cough, unspecified: Secondary | ICD-10-CM | POA: Diagnosis not present

## 2021-04-12 DIAGNOSIS — R0602 Shortness of breath: Secondary | ICD-10-CM | POA: Insufficient documentation

## 2021-04-12 DIAGNOSIS — R051 Acute cough: Secondary | ICD-10-CM

## 2021-04-12 DIAGNOSIS — J3489 Other specified disorders of nose and nasal sinuses: Secondary | ICD-10-CM | POA: Diagnosis not present

## 2021-04-12 DIAGNOSIS — F17210 Nicotine dependence, cigarettes, uncomplicated: Secondary | ICD-10-CM | POA: Diagnosis not present

## 2021-04-12 LAB — CBC WITH DIFFERENTIAL/PLATELET
Abs Immature Granulocytes: 0.18 10*3/uL — ABNORMAL HIGH (ref 0.00–0.07)
Basophils Absolute: 0.1 10*3/uL (ref 0.0–0.1)
Basophils Relative: 1 %
Eosinophils Absolute: 0.2 10*3/uL (ref 0.0–0.5)
Eosinophils Relative: 2 %
HCT: 38.2 % (ref 36.0–46.0)
Hemoglobin: 12 g/dL (ref 12.0–15.0)
Immature Granulocytes: 2 %
Lymphocytes Relative: 24 %
Lymphs Abs: 2.5 10*3/uL (ref 0.7–4.0)
MCH: 31.9 pg (ref 26.0–34.0)
MCHC: 31.4 g/dL (ref 30.0–36.0)
MCV: 101.6 fL — ABNORMAL HIGH (ref 80.0–100.0)
Monocytes Absolute: 0.8 10*3/uL (ref 0.1–1.0)
Monocytes Relative: 8 %
Neutro Abs: 6.6 10*3/uL (ref 1.7–7.7)
Neutrophils Relative %: 63 %
Platelets: 413 10*3/uL — ABNORMAL HIGH (ref 150–400)
RBC: 3.76 MIL/uL — ABNORMAL LOW (ref 3.87–5.11)
RDW: 12.6 % (ref 11.5–15.5)
WBC: 10.4 10*3/uL (ref 4.0–10.5)
nRBC: 0 % (ref 0.0–0.2)

## 2021-04-12 LAB — BASIC METABOLIC PANEL
Anion gap: 9 (ref 5–15)
BUN: 17 mg/dL (ref 8–23)
CO2: 30 mmol/L (ref 22–32)
Calcium: 8.2 mg/dL — ABNORMAL LOW (ref 8.9–10.3)
Chloride: 102 mmol/L (ref 98–111)
Creatinine, Ser: 0.63 mg/dL (ref 0.44–1.00)
GFR, Estimated: >60 mL/min (ref >60)
Glucose, Bld: 127 mg/dL — ABNORMAL HIGH (ref 70–99)
Potassium: 3.9 mmol/L (ref 3.5–5.1)
Sodium: 141 mmol/L (ref 135–145)

## 2021-04-12 LAB — BRAIN NATRIURETIC PEPTIDE: B Natriuretic Peptide: 223.5 pg/mL — ABNORMAL HIGH (ref 0.0–100.0)

## 2021-04-12 NOTE — ED Triage Notes (Signed)
EMS stated, SOB , lost all of her medication from last week. Pt is homeless. D/C'd on the 18th.

## 2021-04-12 NOTE — ED Triage Notes (Signed)
Pt. Stated, I can't breath when Im  lying down, Its hard.
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
## 2021-04-12 NOTE — Discharge Instructions (Signed)
Follow up as directed in your hospital discharge.

## 2021-04-12 NOTE — ED Notes (Signed)
Patient not cooperating with vital signs at this time. Food and drink provided to patient.
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
## 2021-04-12 NOTE — ED Provider Notes (Addendum)
Emergency Medicine Provider Triage Evaluation Note  Angela Medina , a 70 y.o. female  was evaluated in triage.  Pt complains of shortness of breath. Patient has been seen here extensively in the last 10 days. Patient admitted from 12/12 to 12/19 for bronchopneumonia and discharged with abx. Patient returned to ED on 12/20 complaining of SOB and cough. Patient returns today stating that she has lost her medication and complaining of SOB and cough. Patient is homeless.   Review of Systems  Positive: SOB, cough Negative: Lower extremity swelling, chest pain, N/V/D  Physical Exam  BP 140/83 (BP Location: Right Arm)    Pulse 73    Temp 98.4 F (36.9 C) (Oral)    Resp 16    SpO2 94%  Gen:   Awake, no distress   Resp:  Normal effort  MSK:   Moves extremities without difficulty  Other:  Diffuse rhonchi and expiratory wheeze on exam  Medical Decision Making  Medically screening exam initiated at 9:20 AM.  Appropriate orders placed.  JAYDALYN DEMATTIA was informed that the remainder of the evaluation will be completed by another provider, this initial triage assessment does not replace that evaluation, and the importance of remaining in the ED until their evaluation is complete.        Al Decant, PA-C 04/12/21 [id number]    Rozelle Logan, DO 04/12/21 1147

## 2021-04-18 ENCOUNTER — Encounter (HOSPITAL_COMMUNITY): Payer: Self-pay

## 2021-04-18 ENCOUNTER — Other Ambulatory Visit: Payer: Self-pay

## 2021-04-18 ENCOUNTER — Emergency Department (HOSPITAL_COMMUNITY): Payer: Medicare Other

## 2021-04-18 ENCOUNTER — Emergency Department (HOSPITAL_COMMUNITY)
Admission: EM | Admit: 2021-04-18 | Discharge: 2021-04-18 | Disposition: A | Discharge status: Home / Self Care | Payer: Medicare Other | Attending: Emergency Medicine | Admitting: Emergency Medicine

## 2021-04-18 DIAGNOSIS — F17210 Nicotine dependence, cigarettes, uncomplicated: Secondary | ICD-10-CM | POA: Diagnosis not present

## 2021-04-18 DIAGNOSIS — S022XXA Fracture of nasal bones, initial encounter for closed fracture: Secondary | ICD-10-CM | POA: Insufficient documentation

## 2021-04-18 DIAGNOSIS — R040 Epistaxis: Secondary | ICD-10-CM | POA: Diagnosis not present

## 2021-04-18 DIAGNOSIS — S0990XA Unspecified injury of head, initial encounter: Secondary | ICD-10-CM | POA: Diagnosis present

## 2021-04-18 LAB — BASIC METABOLIC PANEL
Anion gap: 9 (ref 5–15)
BUN: 24 mg/dL — ABNORMAL HIGH (ref 8–23)
CO2: 29 mmol/L (ref 22–32)
Calcium: 8.7 mg/dL — ABNORMAL LOW (ref 8.9–10.3)
Chloride: 102 mmol/L (ref 98–111)
Creatinine, Ser: 0.54 mg/dL (ref 0.44–1.00)
GFR, Estimated: >60 mL/min (ref >60)
Glucose, Bld: 106 mg/dL — ABNORMAL HIGH (ref 70–99)
Potassium: 3.7 mmol/L (ref 3.5–5.1)
Sodium: 140 mmol/L (ref 135–145)

## 2021-04-18 LAB — CBC WITH DIFFERENTIAL/PLATELET
Abs Immature Granulocytes: 0.06 10*3/uL (ref 0.00–0.07)
Basophils Absolute: 0.1 10*3/uL (ref 0.0–0.1)
Basophils Relative: 1 %
Eosinophils Absolute: 0.1 10*3/uL (ref 0.0–0.5)
Eosinophils Relative: 1 %
HCT: 41.1 % (ref 36.0–46.0)
Hemoglobin: 13 g/dL (ref 12.0–15.0)
Immature Granulocytes: 1 %
Lymphocytes Relative: 17 %
Lymphs Abs: 1.6 10*3/uL (ref 0.7–4.0)
MCH: 31.7 pg (ref 26.0–34.0)
MCHC: 31.6 g/dL (ref 30.0–36.0)
MCV: 100.2 fL — ABNORMAL HIGH (ref 80.0–100.0)
Monocytes Absolute: 0.9 10*3/uL (ref 0.1–1.0)
Monocytes Relative: 9 %
Neutro Abs: 6.7 10*3/uL (ref 1.7–7.7)
Neutrophils Relative %: 71 %
Platelets: 501 10*3/uL — ABNORMAL HIGH (ref 150–400)
RBC: 4.1 MIL/uL (ref 3.87–5.11)
RDW: 13.2 % (ref 11.5–15.5)
WBC: 9.4 10*3/uL (ref 4.0–10.5)
nRBC: 0 % (ref 0.0–0.2)

## 2021-04-18 LAB — PROTIME-INR
INR: 1 (ref 0.8–1.2)
Prothrombin Time: 13.3 seconds (ref 11.4–15.2)

## 2021-04-18 LAB — APTT: aPTT: 27 seconds (ref 24–36)
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
## 2021-04-18 NOTE — ED Provider Notes (Addendum)
Patient became upset with initial physician assistant and requested speaking with me.  She states that she is concerned that her nose may bleed again.  She is concerned about being discharged "too early."  I agreed to watch her for some additional time.  She continues to have no recurrent bleeding.  And is discharged in safe condition.  Advised outpatient follow-up with ENT for her nasal fracture within the week.  Addendum: Upon being informed of the discharge she began to curse, became verbally abusive, combative.  Nursing advised to call security as needed.    Cheryll Cockayne, MD 04/18/21 2216    Cheryll Cockayne, MD 04/18/21 2217
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
## 2021-04-18 NOTE — ED Notes (Signed)
Pt has been given 2 [place] sandwiches and 2 sprites. Pt given crackers and peanut butter.

## 2021-05-01 ENCOUNTER — Emergency Department (HOSPITAL_COMMUNITY)
Admission: EM | Admit: 2021-05-01 | Discharge: 2021-05-02 | Disposition: A | Discharge status: Home / Self Care | Payer: Medicare Other | Attending: Emergency Medicine | Admitting: Emergency Medicine

## 2021-05-01 ENCOUNTER — Encounter (HOSPITAL_COMMUNITY): Payer: Self-pay

## 2021-05-01 ENCOUNTER — Emergency Department (HOSPITAL_COMMUNITY): Payer: Medicare Other

## 2021-05-01 DIAGNOSIS — Z20822 Contact with and (suspected) exposure to covid-19: Secondary | ICD-10-CM | POA: Diagnosis not present

## 2021-05-01 DIAGNOSIS — S0990XA Unspecified injury of head, initial encounter: Secondary | ICD-10-CM | POA: Diagnosis not present

## 2021-05-01 DIAGNOSIS — W1809XA Striking against other object with subsequent fall, initial encounter: Secondary | ICD-10-CM | POA: Insufficient documentation

## 2021-05-01 DIAGNOSIS — J3489 Other specified disorders of nose and nasal sinuses: Secondary | ICD-10-CM | POA: Diagnosis not present

## 2021-05-01 DIAGNOSIS — Y9289 Other specified places as the place of occurrence of the external cause: Secondary | ICD-10-CM | POA: Insufficient documentation

## 2021-05-01 DIAGNOSIS — R059 Cough, unspecified: Secondary | ICD-10-CM | POA: Diagnosis not present

## 2021-05-01 DIAGNOSIS — L97519 Non-pressure chronic ulcer of other part of right foot with unspecified severity: Secondary | ICD-10-CM

## 2021-05-01 LAB — RESP PANEL BY RT-PCR (FLU A&B, COVID) ARPGX2
Influenza A by PCR: NEGATIVE
Influenza B by PCR: NEGATIVE
SARS Coronavirus 2 by RT PCR: NEGATIVE

## 2021-05-01 NOTE — ED Triage Notes (Signed)
Pt BIB [facility] for eval of SOB. Pt reports cough, nasal congestion. Pt is homeless. No respiratory distress on arrival

## 2021-05-01 NOTE — ED Provider Triage Note (Signed)
Emergency Medicine Provider Triage Evaluation Note  Angela CombesRebecca S Medina , a 71 y.o. female  was evaluated in triage.  Pt complains of cough.  Review of Systems  Positive: cough Negative: fever  Physical Exam  BP (!) 156/113 (BP Location: Left Arm)    Pulse 72    Temp 98.1 F (36.7 C) (Oral)    Resp 14    Ht 5\' 1"  (1.549 m)    Wt 64 kg    SpO2 94%    BMI 26.66 kg/m  Gen:   Awake, no distress   Resp:  Normal effort  MSK:   Moves extremities without difficulty  Other:  Mild wheezing noted  Medical Decision Making  Medically screening exam initiated at 8:53 PM.  Appropriate orders placed.  Angela CombesRebecca S Medina was informed that the remainder of the evaluation will be completed by another provider, this initial triage assessment does not replace that evaluation, and the importance of remaining in the ED until their evaluation is complete.     [name], Lysbeth Dicola S, [place]PA-C 05/01/21 2054

## 2021-05-02 ENCOUNTER — Other Ambulatory Visit (HOSPITAL_COMMUNITY): Payer: Self-pay

## 2021-05-02 ENCOUNTER — Emergency Department (HOSPITAL_COMMUNITY): Payer: Medicare Other

## 2021-05-02 MED ORDER — DOXYCYCLINE HYCLATE 100 MG PO CAPS: 100.0000 mg | ORAL_CAPSULE | Freq: Two times a day (BID) | ORAL | 0 refills | Status: DC

## 2021-05-02 MED ORDER — DOXYCYCLINE HYCLATE 100 MG PO CAPS
100.0000 mg | ORAL_CAPSULE | Freq: Two times a day (BID) | ORAL | 0 refills | Status: DC
  Filled 2021-05-02: qty 20, 10d supply, fill #0

## 2021-05-02 NOTE — ED Notes (Signed)
Breakfast Ordered 
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
## 2021-05-02 NOTE — ED Notes (Signed)
Pt provided ed snack bag and drink
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
## 2021-05-02 NOTE — Discharge Instructions (Addendum)
Your CT scan was negative, indicating there is no concerning injury from when you hit your head the other day. Take the antibiotics as prescribed to help with your nose and foot.  Take the entire course, even if symptoms improve. Return to the emergency room with any new, worsening, or concerning symptoms

## 2021-05-04 ENCOUNTER — Emergency Department (HOSPITAL_COMMUNITY): Payer: Medicare Other

## 2021-05-04 ENCOUNTER — Emergency Department (HOSPITAL_COMMUNITY)
Admission: EM | Admit: 2021-05-04 | Discharge: 2021-05-05 | Disposition: A | Discharge status: Home / Self Care | Payer: Medicare Other | Attending: Emergency Medicine | Admitting: Emergency Medicine

## 2021-05-04 ENCOUNTER — Encounter (HOSPITAL_COMMUNITY): Payer: Self-pay

## 2021-05-04 ENCOUNTER — Other Ambulatory Visit: Payer: Self-pay

## 2021-05-04 DIAGNOSIS — L03115 Cellulitis of right lower limb: Secondary | ICD-10-CM | POA: Diagnosis not present

## 2021-05-04 DIAGNOSIS — L089 Local infection of the skin and subcutaneous tissue, unspecified: Secondary | ICD-10-CM | POA: Diagnosis not present

## 2021-05-04 LAB — CBC WITH DIFFERENTIAL/PLATELET
Abs Immature Granulocytes: 0.03 10*3/uL (ref 0.00–0.07)
Basophils Absolute: 0.1 10*3/uL (ref 0.0–0.1)
Basophils Relative: 1 %
Eosinophils Absolute: 0.2 10*3/uL (ref 0.0–0.5)
Eosinophils Relative: 2 %
HCT: 42 % (ref 36.0–46.0)
Hemoglobin: 13.4 g/dL (ref 12.0–15.0)
Immature Granulocytes: 0 %
Lymphocytes Relative: 30 %
Lymphs Abs: 2.5 10*3/uL (ref 0.7–4.0)
MCH: 31.6 pg (ref 26.0–34.0)
MCHC: 31.9 g/dL (ref 30.0–36.0)
MCV: 99.1 fL (ref 80.0–100.0)
Monocytes Absolute: 0.7 10*3/uL (ref 0.1–1.0)
Monocytes Relative: 8 %
Neutro Abs: 4.9 10*3/uL (ref 1.7–7.7)
Neutrophils Relative %: 59 %
Platelets: 378 10*3/uL (ref 150–400)
RBC: 4.24 MIL/uL (ref 3.87–5.11)
RDW: 13.4 % (ref 11.5–15.5)
WBC: 8.4 10*3/uL (ref 4.0–10.5)
nRBC: 0 % (ref 0.0–0.2)

## 2021-05-04 LAB — BASIC METABOLIC PANEL
Anion gap: 11 (ref 5–15)
BUN: 20 mg/dL (ref 8–23)
CO2: 29 mmol/L (ref 22–32)
Calcium: 9 mg/dL (ref 8.9–10.3)
Chloride: 95 mmol/L — ABNORMAL LOW (ref 98–111)
Creatinine, Ser: 0.59 mg/dL (ref 0.44–1.00)
GFR, Estimated: >60 mL/min (ref >60)
Glucose, Bld: 125 mg/dL — ABNORMAL HIGH (ref 70–99)
Potassium: 4 mmol/L (ref 3.5–5.1)
Sodium: 135 mmol/L (ref 135–145)

## 2021-05-04 MED ORDER — DEXTROSE 5 % IV SOLN
1500.0000 mg | Freq: Once | INTRAVENOUS | Status: AC
  Administered 2021-05-05: 1500 mg via INTRAVENOUS
  Filled 2021-05-04: qty 75

## 2021-05-04 NOTE — ED Provider Triage Note (Addendum)
Emergency Medicine Provider Triage Evaluation Note  Angela CombesRebecca Medina Medina , a 71 y.o. female  was evaluated in triage.  Pt complains of foot pain. Reports redness and pain to the foot. Ongoing for several weeks but worsening  Review of Systems  Positive: Foot pain and infection Negative: trauma  Physical Exam  BP (!) 134/93 (BP Location: Right Arm)    Pulse 89    Temp 98.9 F (37.2 C) (Oral)    Resp 18    Ht 5\' 1"  (1.549 m)    Wt 64 kg    SpO2 100%    BMI 26.66 kg/m  Gen:   Awake, no distress   Resp:  Normal effort  MSK:   Moves extremities without difficulty  Other:  Erythema, warmth to the right 3rd toe. Dp pulse intact.     Medical Decision Making  Medically screening exam initiated at 8:56 PM.  Appropriate orders placed.  Angela CombesRebecca Medina Medina was informed that the remainder of the evaluation will be completed by another provider, this initial triage assessment does not replace that evaluation, and the importance of remaining in the ED until their evaluation is complete.  Reviewed records, pt seen on 03/14/2021 for eval of similar sxs. Concern for osteomyelitis at that time and an MRI Was ordered. Pt left prior to this being completed.   9:32 PM I was contacted by MRI who stated that patient cannot undergo MRI due to having a clip in her brain. She does not know if this clip is MRI compatible.    Angela Medina, Angela Schwake S, PA-C 05/04/21 2058    Angela Medina, Angela Hopwood S, PA-C 05/04/21 2132

## 2021-05-04 NOTE — ED Triage Notes (Signed)
Per EMS-Patient c/o right foot infection

## 2021-05-05 NOTE — ED Provider Notes (Signed)
[facility] Emergency Department Provider Note MRN:  [id number]  Arrival date & time: 05/05/21     Chief Complaint   Recurrent Skin Infections   History of Present Illness   Angela Medina is a 71 y.o. year-old female presents to the ED with chief complaint of infection to right foot.  She states that she has noticed increasing redness to her right foot over the past few days.  She denies any fever or chills.  Denies any new injuries.  Denies any successful treatments prior to arrival.    Review of Systems  Pertinent review of systems noted in HPI.    Physical Exam   Vitals:   05/04/21 1816 05/05/21 0020  BP: (!) 134/93 134/80  Pulse: 89 80  Resp: 18 20  Temp: 98.9 F (37.2 C)   SpO2: 100% 99%    CONSTITUTIONAL:  chronically ill-appearing, NAD NEURO:  Alert and oriented x 3, CN 3-12 grossly intact EYES:  eyes equal and reactive ENT/NECK:  Supple, no stridor  CARDIO:  normal rate, regular rhythm, appears well-perfused  PULM:  No respiratory distress,  GI/GU:  non-distended,  MSK/SPINE:  No gross deformities, no edema, moves all extremities  SKIN:  no rash, atraumatic, mild cellulitis to right foot, scab on right middle toe     *Additional and/or pertinent findings included in MDM below  Diagnostic and Interventional Summary    EKG Interpretation  Date/Time:    Ventricular Rate:    PR Interval:    QRS Duration:   QT Interval:    QTC Calculation:   R Axis:     Text Interpretation:         Labs Reviewed  BASIC METABOLIC PANEL - Abnormal; Notable for the following components:      Result Value   Chloride 95 (*)    Glucose, Bld 125 (*)    All other components within normal limits  CBC WITH DIFFERENTIAL/PLATELET    DG Foot Complete Right  Final Result      Medications  dalbavancin (DALVANCE) 1,500 mg in dextrose 5 % 500 mL IVPB (1,500 mg Intravenous New Bag/Given 05/05/21 0013)     Procedures  /  Critical  Care Procedures  ED Course and Medical Decision Making  I have reviewed the triage vital signs, the nursing notes, and pertinent available records from the EMR.  Complexity of Problems Addressed Acute complicated illness or Injury  Additional Data Reviewed and Analyzed Further history obtained from: Past medical history and medications listed in the EMR, Prior ED visit notes, and Care Everywhere    ED Course   Patient here with erythema to right foot.  This is concerning for developing cellulitis.  She does not have any discharge from the wounds.  She is afebrile.  Vital signs are stable. I personally reviewed the labs and x-ray.  X-ray shows chronic changes, but no evidence of osteomyelitis.  The soft tissue beneath the scab does not appear concerning for necrosis.  When I took the patient's sock off, most of the scab fell off.  Laboratory work-up is reassuring.  Patient does not appear septic.  I considered admission to the hospital in this patient, but given boarding status, made the decision to treat patient with IV dalbavancin.  This also mitigates any potential financial barriers to outpatient care given that dalbavancin is one-time dose.  Patient discharged with outpatient follow-up.  Return precautions discussed.       Final Clinical Impressions(s) / ED Diagnoses  ICD-10-CM   1. Right foot infection  L08.9 Ambulatory referral to Infectious Disease    2. Cellulitis of right lower extremity  L03.115       ED Discharge Orders          Ordered    Ambulatory referral to Infectious Disease       Comments: Cellulitis patient:  Received dalbavancin on 05/04/2021.   05/04/21 2338             Discharge Instructions Discussed with and Provided to Patient:    Discharge Instructions      The antibiotic given tonight should continue to work for about a week.  If you have new or worsening symptoms, you can return at any time.      Montine Circle,  PA-C 05/05/21 [place], [place], DO 05/05/21 0425

## 2021-05-05 NOTE — Discharge Instructions (Addendum)
The antibiotic given tonight should continue to work for about a week.  If you have new or worsening symptoms, you can return at any time.

## 2021-05-10 ENCOUNTER — Other Ambulatory Visit (HOSPITAL_COMMUNITY): Payer: Self-pay

## 2021-05-11 ENCOUNTER — Other Ambulatory Visit (HOSPITAL_COMMUNITY): Payer: Self-pay

## 2021-05-14 ENCOUNTER — Other Ambulatory Visit (HOSPITAL_COMMUNITY): Payer: Self-pay

## 2021-06-17 ENCOUNTER — Emergency Department (HOSPITAL_BASED_OUTPATIENT_CLINIC_OR_DEPARTMENT_OTHER)
Admission: EM | Admit: 2021-06-17 | Discharge: 2021-06-27 | Disposition: A | Discharge status: Home / Self Care | Payer: Medicare Other | Attending: Emergency Medicine | Admitting: Emergency Medicine

## 2021-06-17 ENCOUNTER — Other Ambulatory Visit: Payer: Self-pay

## 2021-06-17 ENCOUNTER — Encounter (HOSPITAL_BASED_OUTPATIENT_CLINIC_OR_DEPARTMENT_OTHER): Payer: Self-pay | Admitting: Emergency Medicine

## 2021-06-17 DIAGNOSIS — Z91128 Patient's intentional underdosing of medication regimen for other reason: Secondary | ICD-10-CM | POA: Insufficient documentation

## 2021-06-17 DIAGNOSIS — Z716 Tobacco abuse counseling: Secondary | ICD-10-CM | POA: Insufficient documentation

## 2021-06-17 DIAGNOSIS — R519 Headache, unspecified: Secondary | ICD-10-CM | POA: Diagnosis not present

## 2021-06-17 DIAGNOSIS — Z5902 Unsheltered homelessness: Secondary | ICD-10-CM | POA: Insufficient documentation

## 2021-06-17 DIAGNOSIS — R2681 Unsteadiness on feet: Secondary | ICD-10-CM | POA: Diagnosis not present

## 2021-06-17 DIAGNOSIS — T50916A Underdosing of multiple unspecified drugs, medicaments and biological substances, initial encounter: Secondary | ICD-10-CM | POA: Diagnosis not present

## 2021-06-17 DIAGNOSIS — R451 Restlessness and agitation: Secondary | ICD-10-CM | POA: Diagnosis not present

## 2021-06-17 DIAGNOSIS — Z79899 Other long term (current) drug therapy: Secondary | ICD-10-CM | POA: Diagnosis not present

## 2021-06-17 DIAGNOSIS — Y900 Blood alcohol level of less than 20 mg/100 ml: Secondary | ICD-10-CM | POA: Diagnosis not present

## 2021-06-17 DIAGNOSIS — Z5900 Homelessness unspecified: Secondary | ICD-10-CM

## 2021-06-17 DIAGNOSIS — F259 Schizoaffective disorder, unspecified: Secondary | ICD-10-CM | POA: Diagnosis not present

## 2021-06-17 DIAGNOSIS — F209 Schizophrenia, unspecified: Secondary | ICD-10-CM

## 2021-06-17 DIAGNOSIS — F22 Delusional disorders: Secondary | ICD-10-CM | POA: Diagnosis not present

## 2021-06-17 DIAGNOSIS — F1010 Alcohol abuse, uncomplicated: Secondary | ICD-10-CM | POA: Diagnosis not present

## 2021-06-17 DIAGNOSIS — M79673 Pain in unspecified foot: Secondary | ICD-10-CM | POA: Insufficient documentation

## 2021-06-17 DIAGNOSIS — Z20822 Contact with and (suspected) exposure to covid-19: Secondary | ICD-10-CM | POA: Insufficient documentation

## 2021-06-17 DIAGNOSIS — M6281 Muscle weakness (generalized): Secondary | ICD-10-CM | POA: Diagnosis not present

## 2021-06-17 DIAGNOSIS — F17200 Nicotine dependence, unspecified, uncomplicated: Secondary | ICD-10-CM | POA: Diagnosis not present

## 2021-06-17 LAB — COMPREHENSIVE METABOLIC PANEL
ALT: 9 U/L (ref 0–44)
AST: 24 U/L (ref 15–41)
Albumin: 3.6 g/dL (ref 3.5–5.0)
Alkaline Phosphatase: 96 U/L (ref 38–126)
Anion gap: 11 (ref 5–15)
BUN: 15 mg/dL (ref 8–23)
CO2: 27 mmol/L (ref 22–32)
Calcium: 8.7 mg/dL — ABNORMAL LOW (ref 8.9–10.3)
Chloride: 98 mmol/L (ref 98–111)
Creatinine, Ser: 0.42 mg/dL — ABNORMAL LOW (ref 0.44–1.00)
GFR, Estimated: >60 mL/min (ref >60)
Glucose, Bld: 84 mg/dL (ref 70–99)
Potassium: 3.9 mmol/L (ref 3.5–5.1)
Sodium: 136 mmol/L (ref 135–145)
Total Bilirubin: 0.4 mg/dL (ref 0.3–1.2)
Total Protein: 7.1 g/dL (ref 6.5–8.1)

## 2021-06-17 LAB — CBC WITH DIFFERENTIAL/PLATELET
Abs Immature Granulocytes: 0.03 10*3/uL (ref 0.00–0.07)
Basophils Absolute: 0.1 10*3/uL (ref 0.0–0.1)
Basophils Relative: 1 %
Eosinophils Absolute: 0.1 10*3/uL (ref 0.0–0.5)
Eosinophils Relative: 1 %
HCT: 45.4 % (ref 36.0–46.0)
Hemoglobin: 14.8 g/dL (ref 12.0–15.0)
Immature Granulocytes: 0 %
Lymphocytes Relative: 18 %
Lymphs Abs: 1.5 10*3/uL (ref 0.7–4.0)
MCH: 31.2 pg (ref 26.0–34.0)
MCHC: 32.6 g/dL (ref 30.0–36.0)
MCV: 95.8 fL (ref 80.0–100.0)
Monocytes Absolute: 0.8 10*3/uL (ref 0.1–1.0)
Monocytes Relative: 10 %
Neutro Abs: 5.8 10*3/uL (ref 1.7–7.7)
Neutrophils Relative %: 70 %
Platelets: 417 10*3/uL — ABNORMAL HIGH (ref 150–400)
RBC: 4.74 MIL/uL (ref 3.87–5.11)
RDW: 13.3 % (ref 11.5–15.5)
WBC: 8.3 10*3/uL (ref 4.0–10.5)
nRBC: 0 % (ref 0.0–0.2)

## 2021-06-17 LAB — RAPID URINE DRUG SCREEN, HOSP PERFORMED
Amphetamines: NOT DETECTED
Barbiturates: NOT DETECTED
Benzodiazepines: NOT DETECTED
Cocaine: NOT DETECTED
Opiates: NOT DETECTED
Tetrahydrocannabinol: NOT DETECTED

## 2021-06-17 LAB — VITAMIN B12: Vitamin B-12: 251 pg/mL (ref 180–914)

## 2021-06-17 LAB — URINALYSIS, ROUTINE W REFLEX MICROSCOPIC
Bilirubin Urine: NEGATIVE
Glucose, UA: 100 mg/dL — AB
Hgb urine dipstick: NEGATIVE
Ketones, ur: NEGATIVE mg/dL
Leukocytes,Ua: NEGATIVE
Nitrite: NEGATIVE
Specific Gravity, Urine: 1.015 (ref 1.005–1.030)
pH: 7 (ref 5.0–8.0)

## 2021-06-17 LAB — TSH: TSH: 0.762 u[IU]/mL (ref 0.350–4.500)

## 2021-06-17 LAB — SALICYLATE LEVEL: Salicylate Lvl: <7 mg/dL — ABNORMAL LOW (ref 7.0–30.0)

## 2021-06-17 LAB — RESP PANEL BY RT-PCR (FLU A&B, COVID) ARPGX2
Influenza A by PCR: NEGATIVE
Influenza B by PCR: NEGATIVE
SARS Coronavirus 2 by RT PCR: NEGATIVE

## 2021-06-17 LAB — ETHANOL: Alcohol, Ethyl (B): <10 mg/dL (ref <10)

## 2021-06-17 LAB — MAGNESIUM: Magnesium: 2.2 mg/dL (ref 1.7–2.4)

## 2021-06-17 LAB — ACETAMINOPHEN LEVEL: Acetaminophen (Tylenol), Serum: <10 ug/mL — ABNORMAL LOW (ref 10–30)

## 2021-06-17 MED ORDER — FOLIC ACID 1 MG PO TABS
1.0000 mg | ORAL_TABLET | Freq: Every day | ORAL | Status: DC
  Administered 2021-06-17 – 2021-06-27 (×8): 1 mg via ORAL
  Filled 2021-06-17 (×8): qty 1

## 2021-06-17 MED ORDER — LORAZEPAM 1 MG PO TABS
1.0000 mg | ORAL_TABLET | ORAL | Status: AC | PRN
  Administered 2021-06-17 – 2021-06-18 (×6): 2 mg via ORAL
  Administered 2021-06-19: 1 mg via ORAL
  Filled 2021-06-17 (×5): qty 2
  Filled 2021-06-17: qty 1
  Filled 2021-06-17: qty 2

## 2021-06-17 MED ORDER — OLANZAPINE 5 MG PO TABS
5.0000 mg | ORAL_TABLET | Freq: Every day | ORAL | Status: DC
  Administered 2021-06-17 – 2021-06-19 (×3): 5 mg via ORAL
  Filled 2021-06-17 (×6): qty 1

## 2021-06-17 MED ORDER — MAGNESIUM OXIDE -MG SUPPLEMENT 400 (240 MG) MG PO TABS
400.0000 mg | ORAL_TABLET | Freq: Once | ORAL | Status: AC
  Administered 2021-06-17: 400 mg via ORAL
  Filled 2021-06-17: qty 1

## 2021-06-17 MED ORDER — THIAMINE HCL 100 MG PO TABS
100.0000 mg | ORAL_TABLET | Freq: Every day | ORAL | Status: DC
  Administered 2021-06-17 – 2021-06-27 (×8): 100 mg via ORAL
  Filled 2021-06-17 (×7): qty 1

## 2021-06-17 MED ORDER — THIAMINE HCL 100 MG/ML IJ SOLN: 100.0000 mg | Freq: Every day | INTRAMUSCULAR | Status: DC

## 2021-06-17 MED ORDER — NICOTINE 14 MG/24HR TD PT24
14.0000 mg | MEDICATED_PATCH | Freq: Once | TRANSDERMAL | Status: AC
Start: 2021-06-17 — End: 2021-06-19
  Administered 2021-06-18: 14 mg via TRANSDERMAL
  Filled 2021-06-17: qty 1

## 2021-06-17 MED ORDER — ADULT MULTIVITAMIN W/MINERALS CH
1.0000 | ORAL_TABLET | Freq: Every day | ORAL | Status: DC
  Administered 2021-06-17 – 2021-06-27 (×8): 1 via ORAL
  Filled 2021-06-17 (×9): qty 1

## 2021-06-17 MED ORDER — LORAZEPAM 2 MG/ML IJ SOLN: 1.0000 mg | INTRAMUSCULAR | Status: AC | PRN

## 2021-06-17 MED ORDER — THIAMINE HCL 100 MG PO TABS
50.0000 mg | ORAL_TABLET | Freq: Once | ORAL | Status: DC
  Filled 2021-06-17: qty 1
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
## 2021-06-17 NOTE — ED Triage Notes (Signed)
Pt arrives from [facility]. Pt states her feet are hurting/numb. Pt states she is being poisoned by [employer]. Pt is erratic in her thinking and behavior.

## 2021-06-17 NOTE — ED Notes (Signed)
Patient increasingly having outbursts for the nurse, her thoughts are racing.ciwa performed and medicated. Pt requests help to get into a nursing home, states she cannot make it on the street one more night. Sw consult placed

## 2021-06-17 NOTE — ED Notes (Signed)
Spoke with patients sister, she states she has tried to help numerous times over the years but patient doesn't stay to receive treatment. Sister cannot help. Sister would like to be notified of disposition tomorrow.

## 2021-06-17 NOTE — ED Notes (Signed)
[facility] team at bedside. Transport [facility] [employer] provided with pt belongings, valuables envelope, and zyprexa. Pt continues to rest following ativan administration. [facility] ED Charge Nurse [place] informed of pt's departure.

## 2021-06-17 NOTE — ED Provider Notes (Signed)
Care assumed from Dr. Gray.  At time of transfer care, patient was medically cleared already and is awaiting TTS recommendations for paranoia.  She also has had some difficulty Wallace Cullenswith ambulation due to chronic paresthesias.  Per previous team's plan, psychiatry wants her to get Zyprexa and they will reassess her in the morning to determine disposition or placement.  I was informed that we do not have Zyprexa at this facility.  I was also informed she needs physical therapy evaluation given her mobility challenges that might contribute to where she needs placement.  Initially, plan was to transfer to [facility] to allow this to happen but I was informed that they cannot accommodate her at this time.  Patient thus will be transferred to [facility] to get seen by psychiatry tomorrow, get started on the Zyprexa that we do not have, and have PT see her.  Spoke with Dr. Sherin QuarryJosh Hong who accepts the patient for ED to ED transfer.  She will be transferred.   Clinical Impression: 1. Alcohol abuse   2. Homeless   3. Schizophrenia, unspecified type [facility](HCC)     Disposition: ED to ED transfer for psychiatry to see in the morning and PT to evaluate.  This note was prepared with assistance of Conservation officer, historic buildingsDragon voice recognition software. Occasional wrong-word or sound-a-like substitutions may have occurred due to the inherent limitations of voice recognition software.     Kingsten Enfield, Canary Brimhristopher J, MD 06/17/21 2028

## 2021-06-17 NOTE — ED Notes (Signed)
Report received by previous RN. Pt was agitated with elevated CIWA. Was dosed with Ativan and has since been sleeping. Upon assessing pt, pt continues to sleep, connected to VS monitoring system, in hospital gown, purwick in place. Pt appearance is disheveled, with matted hair. Pending zyprexa available, will return to administer medication. Pt currently voluntary, pending transition of care as pt has requested SNF placement

## 2021-06-17 NOTE — ED Notes (Signed)
Pts money at bedside side placed in secured and sealed "patient valuables envelope" Pt has $20 x1, $10 x2, $5 x7, and $1 x21, for a total of $96.00. Amount confirmed by Annamaria Helling RN, [facility].   Remaining belongings at bedside: black pants, navy blue sweater, 1 pair of black socks, 2 rolls of toilet paper, and 1 cigarette.

## 2021-06-17 NOTE — ED Provider Notes (Signed)
Patient arrives in transfer from [facility] for overnight observation and AM psych and PT evaluation. Home meds have been ordered and consults placed by prior ED team.   On assessment patient is resting comfortably, requesting graham crackers which she is eating.   Pending AM evaluations    Desmond Lopeetrucelli, Kassi Esteve R, PA-C 06/19/21 0740    Palumbo, April, MD 06/19/21 2337

## 2021-06-17 NOTE — ED Notes (Signed)
Communicated with [place] Nurse Mali G. Pt to go ED to ED, accepted by Dr Almyra Free, for continuation of psychiatric care and PT evaluation.

## 2021-06-17 NOTE — ED Provider Notes (Signed)
[facility] EMERGENCY DEPT Provider Note   CSN: [id number] Arrival date & time: 06/17/21  0857     History  Chief Complaint  Patient presents with   Foot Pain   Delusional    Pt presented to ED with apparent delusion -- reported that she is being poisoned with strychnine through food and wine.      Angela Medina is a 71 y.o. female.  This is a 71 y.o. female with significant medical history as below, including schizoaffective disorder, homeless, tobacco abuse who presents to the ED with complaint of concern for strychnine poisoning  Patient feels over the past 2 weeks her grocery store has been poisoning her wine with strychnine.  She drinks 1-2 bottles of wine daily, last alcohol consumption was approximately 4 AM this morning.  Denies history of alcohol withdrawal seizure in the past.  Patient reports that she has recently become homeless and has not taken her medications in the past 2 weeks.  She reports that she was thrown out of her home because "they wanted her drawings and sketches."  Patient also is concerned that the grocery store has been poisoning her because there is a high level corruption ring associate with the drugstore and she made a complaint at one point and some "powerful people are trying to harm her."  She reports intermittent tingling and paresthesias to her feet, sometimes her hands.  No numbness or weakness.  No headaches or vision changes.  No chest pain or dyspnea.    Past Medical History: No date: Aneurysm ([place]) No date: Depression No date: Schizoaffective disorder ([place]) No date: Vision changes     Comment:  right eye  Past Surgical History: No date: FOOT SURGERY    The history is provided by the patient. No language interpreter was used.      Home Medications Prior to Admission medications   Medication Sig Start Date End Date Taking? Authorizing Provider  albuterol (VENTOLIN HFA) 108 (90 Base) MCG/ACT inhaler Inhale 2 puffs  into the lungs every 6 (six) hours as needed for wheezing or shortness of breath. 04/02/21   Mitzi Hansen, MD  doxycycline (VIBRAMYCIN) 100 MG capsule Take 1 capsule (100 mg total) by mouth 2 (two) times daily. 05/02/21   Caccavale, Sophia, PA-C  ibuprofen (ADVIL) 200 MG tablet Take 200 mg by mouth every 6 (six) hours as needed for mild pain or moderate pain.    [provider]  OLANZapine (ZYPREXA) 5 MG tablet Take 1 tablet (5 mg total) by mouth at bedtime. 04/02/21   Mitzi Hansen, MD      Allergies    Penicillins    Review of Systems   Review of Systems  Constitutional:  Negative for chills and fever.  HENT:  Negative for facial swelling and trouble swallowing.   Eyes:  Negative for photophobia and visual disturbance.  Respiratory:  Negative for cough and shortness of breath.   Cardiovascular:  Negative for chest pain and palpitations.  Gastrointestinal:  Negative for abdominal pain, nausea and vomiting.  Endocrine: Negative for polydipsia and polyuria.  Genitourinary:  Negative for difficulty urinating and hematuria.  Musculoskeletal:  Positive for arthralgias. Negative for gait problem and joint swelling.  Skin:  Negative for pallor and rash.  Neurological:  Negative for syncope and headaches.  Psychiatric/Behavioral:  Negative for agitation and confusion.    Physical Exam Updated Vital Signs BP 108/64    Pulse 81    Temp 98 F (36.7 C) (Oral)  Resp 20    SpO2 93%  Physical Exam Vitals and nursing note reviewed.  Constitutional:      General: She is not in acute distress.    Appearance: She is not ill-appearing, toxic-appearing or diaphoretic.     Comments: Disheveled appearance  HENT:     Head: Normocephalic and atraumatic.     Right Ear: External ear normal.     Left Ear: External ear normal.     Nose: Nose normal.     Mouth/Throat:     Mouth: Mucous membranes are moist.  Eyes:     General: No scleral icterus.       Right eye: No discharge.         Left eye: No discharge.  Cardiovascular:     Rate and Rhythm: Normal rate and regular rhythm.     Pulses: Normal pulses.     Heart sounds: Normal heart sounds.  Pulmonary:     Effort: Pulmonary effort is normal. No respiratory distress.     Breath sounds: Normal breath sounds.  Abdominal:     General: Abdomen is flat.     Palpations: Abdomen is soft.     Tenderness: There is no abdominal tenderness.  Musculoskeletal:        General: Normal range of motion.     Cervical back: Normal range of motion.     Right lower leg: No edema.     Left lower leg: No edema.  Skin:    General: Skin is warm and dry.     Capillary Refill: Capillary refill takes less than 2 seconds.  Neurological:     Mental Status: She is alert.     GCS: GCS eye subscore is 4. GCS verbal subscore is 5. GCS motor subscore is 6.  Psychiatric:        Attention and Perception: She is inattentive. She perceives auditory and visual hallucinations.        Mood and Affect: Mood normal. Affect is labile.        Speech: Speech is tangential.        Behavior: Behavior is agitated.        Thought Content: Thought content is paranoid and delusional. Thought content does not include homicidal or suicidal ideation.    ED Results / Procedures / Treatments   Labs (all labs ordered are listed, but only abnormal results are displayed) Labs Reviewed  CBC WITH DIFFERENTIAL/PLATELET - Abnormal; Notable for the following components:      Result Value   Platelets 417 (*)    All other components within normal limits  COMPREHENSIVE METABOLIC PANEL - Abnormal; Notable for the following components:   Creatinine, Ser 0.42 (*)    Calcium 8.7 (*)    All other components within normal limits  SALICYLATE LEVEL - Abnormal; Notable for the following components:   Salicylate Lvl [id number] (*)    All other components within normal limits  ACETAMINOPHEN LEVEL - Abnormal; Notable for the following components:   Acetaminophen (Tylenol), Serum <10  (*)    All other components within normal limits  RESP PANEL BY RT-PCR (FLU A&B, COVID) ARPGX2  MAGNESIUM  ETHANOL  VITAMIN B12  TSH  URINALYSIS, ROUTINE W REFLEX MICROSCOPIC  RAPID URINE DRUG SCREEN, HOSP PERFORMED    EKG EKG Interpretation  Date/Time:  Sunday June 17 2021 09:46:34 EST Ventricular Rate:  71 PR Interval:  50 QRS Duration: 106 QT Interval:  400 QTC Calculation: 435 R Axis:   11 Text Interpretation:  Sinus rhythm Short PR interval Right atrial enlargement Left ventricular hypertrophy Borderline T abnormalities, inferior leads Anterior ST elevation, probably due to LVH Artifact in lead(s) I II III aVR aVL aVF V1 V2 V5 Morphology appears similar to prior EKG although Interpretation limited secondary to artifact Confirmed by Wynona Dove ([id number]) on 06/17/2021 10:23:17 AM  Radiology No results found.  Procedures Procedures    Medications Ordered in ED Medications  thiamine tablet 50 mg (50 mg Oral Not Given 06/17/21 1520)  LORazepam (ATIVAN) tablet 1-4 mg (2 mg Oral Given 06/17/21 1537)    Or  LORazepam (ATIVAN) injection 1-4 mg ( Intravenous See Alternative 06/17/21 1537)  thiamine tablet 100 mg (100 mg Oral Given 06/17/21 1519)    Or  thiamine (B-1) injection 100 mg ( Intravenous See Alternative [id number])  folic acid (FOLVITE) tablet 1 mg (1 mg Oral Given 06/17/21 1520)  multivitamin with minerals tablet 1 tablet (1 tablet Oral Given 06/17/21 1520)  OLANZapine (ZYPREXA) tablet 5 mg (has no administration in time range)  nicotine (NICODERM CQ - dosed in mg/24 hours) patch 14 mg (has no administration in time range)  magnesium oxide (MAG-OX) tablet 400 mg (400 mg Oral Given 06/17/21 1519)    ED Course/ Medical Decision Making/ A&P                           Medical Decision Making  CC: Possible poisoning with strychnine, hand and feet tingling  This patient presents to the Emergency Department for the above complaint. This involves an extensive number of  treatment options and is a complaint that carries with it a high risk of complications and morbidity. Vital signs were reviewed. Serious etiologies considered.  Patient has no nausea or vomiting, no opisthotonus, no convulsions; full range of motion in all 4 extremities.  Record review:  Previous records obtained and reviewed   Additional history obtained from EMS  Medical and surgical history as noted above.   Work up as above, notable for:   Lab results that were available during my care of the patient were visualized by me and considered in my medical decision making.   Cardiac monitoring reviewed and interpreted personally which shows NSR  Social determinants of health include - N/a  Personally discussed patient care with consultant; psychiatry service  Management: CIWA protocol ordered, zyprexa   Pt with likely decompensated schizophrenia 2/2 medication non-compliance; d/w psych service who recommends overnight obs and to restart meds.   Pt is medically cleared at this time. Labs reviewed and are stable. Does not appear to be in ETOH withdrawal at this time. No SI or HI. She is voluntary.  Handoff to incoming EDP pending rpt eval in the AM.        This chart was dictated using voice recognition software.  Despite best efforts to proofread,  errors can occur which can change the documentation meaning.    Amount and/or Complexity of Data Reviewed External Data Reviewed: labs, radiology and notes. Labs: ordered. ECG/medicine tests: ordered and independent interpretation performed. Decision-making details documented in ED Course.  Risk OTC drugs. Prescription drug management. Decision regarding hospitalization. Risk Details: Counseled patient for approximately 4 minutes regarding smoking cessation. Discussed risks of smoking and how they applied and affected their visit here today. Patient not ready to quit at this time, however will follow up with their primary  doctor when they are.   CPT code: [phone]: intermediate counseling for smoking cessation  Final Clinical Impression(s) / ED Diagnoses Final diagnoses:  Alcohol abuse  Homeless  Schizophrenia, unspecified type [facility])    Rx / DC Orders ED Discharge Orders     None         Jeanell Sparrow, DO 06/17/21 1541

## 2021-06-17 NOTE — Discharge Instructions (Addendum)
[facility] [address] [place], [place], [place] [phone] [facility] (Inpatient and outpatient) [phone] (Suboxone and Methadone) [address] [phone] [facility] - Intensive Outpatient [address] [place], [place] [address] [place], [place]  [phone] [facility] (Outpatient, Inpatient, [facility] (Groups and Residental) (insurance only) [phone] [place], [place] [phone] [facility] (for caregivers and family) [address] [place], [place], [place] [phone] [facility] [place] [facility](2 years) Residential: 90 days) [facility] [address] [place], [place], [place] [phone] or [phone] [facility] [address] [place], [place], [place] [phone] [facility] [address] [place], [place] [address] [place], [place] [phone] Admissions: 8am-3pm M-F BATS Program: Residential Program (90 Days) [facility]: [facility]  [place], [place],   [phone] or [phone] (Walk in Hours over the weekend or by referral) [facility]: [facility]:[phone] (for crisis response 24 hours a day 

## 2021-06-17 NOTE — ED Notes (Signed)
Pt arrived via [facility], resting comfortably at this time.

## 2021-06-17 NOTE — Care Management (Signed)
Substance abuse resource attached to discharge instructions. PCP recommended for patient., [facility] attached to follow up

## 2021-06-18 ENCOUNTER — Encounter (HOSPITAL_COMMUNITY): Payer: Self-pay

## 2021-06-18 MED ORDER — HALOPERIDOL LACTATE 5 MG/ML IJ SOLN
5.0000 mg | Freq: Once | INTRAMUSCULAR | Status: AC
  Administered 2021-06-18: 5 mg via INTRAMUSCULAR
  Filled 2021-06-18: qty 1

## 2021-06-18 NOTE — Consult Note (Signed)
Telepsych Consultation   Reason for Consult:  Psychiatric Reassessment  Referring Physician:  Dr. Tanda Rockers Location of Patient:    [facility] Location of Provider: Other: virtual home office  Patient Identification: EMME LITWILLER MRN:  [id number] Principal Diagnosis: Schizoaffective disorder [facility]) Diagnosis:  Principal Problem:   Schizoaffective disorder (HCC)   Total Time spent with patient: 30 minutes  Subjective:   NELSA KUTZLER is a 71 y.o. female patient with hx of schizphrenia.  Patient presented to the hospital for evaluation of foot pain, found to be delusional and referred for psychiatric evaluation.  Today, the patient states, "they are trying to poison me."   HPI:   Patient seen via telepsych by this provider; chart reviewed and consulted with Dr. Lucianne Muss on 06/18/21.  On evaluation JOLEIGH RADEBAUGH is seen laying in hospital bed, eyes closed, snoring heard.  She opens her eyes briefly when greeted by this writer but then keeps them closed throughout the remainder of the assessment.  She states her full name, aware she is at "[facility] in [place]" but cannot state date or time. She states she did not sleep good because she believes people are after her, various individuals to include the grocery story and government agencies are trying to poison her.  She continues with irrelevant information and eventually drifts off to sleep. Nurse also attempted to wake patient up, recommending she participate in assessment but patient looks very tired and although she tries to answer questions, is limited.    Per review of nursing notes, patient is delusional, paranoid and frequently yells when she needs something, i.e., food or assistance with repositioning.    EKG 400/435. LFTs-WNL; No leucocytosis; Since admission, she has been evaluated and medically cleared.    Called patient's sister who is listed as point of contact in her record to obtain collateral only.  Did  share patient's medical history.  Spoke with her sister, who reports she last spoke with her sister in January.  States her sister is very private about his medical/mental health history for she believes she has "schizophreniform" states she had a break in 1970s, and has a long standing history of paranoia and delusional behaviors but this improves when she takes her psychiatric medications. Also states her sister has a very high IQ, is talented in the field of arts and went to the top arts school.  States she lives in [place] and pt is homeless and lives in [place].  States she has been trying to help her sister, with psychiatry, housing but her sister has refused help.  Relates they have an adversarial relationship but shares her sister does reach out to her when she needs money.  States if her sister is in the emergency department and asking for help, "she must really need it."    Per ED Provider Admission Assessment 06/17/2021: History       Chief Complaint  Patient presents with   Foot Pain   Delusional      Pt presented to ED with apparent delusion -- reported that she is being poisoned with strychnine through food and wine.        SANELA MOGEL is a 71 y.o. female.   This is a 71 y.o. female with significant medical history as below, including schizoaffective disorder, homeless, tobacco abuse who presents to the ED with complaint of concern for strychnine poisoning   Patient feels over the past 2 weeks her grocery store has been poisoning  her wine with strychnine.  She drinks 1-2 bottles of wine daily, last alcohol consumption was approximately 4 AM this morning.  Denies history of alcohol withdrawal seizure in the past.  Patient reports that she has recently become homeless and has not taken her medications in the past 2 weeks.  She reports that she was thrown out of her home because "they wanted her drawings and sketches."  Patient also is concerned that the grocery store has been  poisoning her because there is a high level corruption ring associate with the drugstore and she made a complaint at one point and some "powerful people are trying to harm her."   She reports intermittent tingling and paresthesias to her feet, sometimes her hands.  No numbness or weakness.  No headaches or vision changes.  No chest pain or dyspnea.  Past Psychiatric History: Patient has hx of schizoaffective disorder  Risk to Self:  yes Risk to Others:  no Prior Inpatient Therapy:  unknown Prior Outpatient Therapy:  unknown  Past Medical History:  Past Medical History:  Diagnosis Date   Aneurysm (HCC)    Depression    Schizoaffective disorder (HCC)    Vision changes    right eye    Past Surgical History:  Procedure Laterality Date   FOOT SURGERY     Family History: History reviewed. No pertinent family history. Family Psychiatric  History: unknown Social History:  Social History   Substance and Sexual Activity  Alcohol Use Yes   Comment: drinks a bottle of wine a day,sometimes too     Social History   Substance and Sexual Activity  Drug Use Never    Social History   Socioeconomic History   Marital status: Single    Spouse name: Not on file   Number of children: Not on file   Years of education: Not on file   Highest education level: Not on file  Occupational History   Not on file  Tobacco Use   Smoking status: Some Days    Types: Cigarettes   Smokeless tobacco: Never  Vaping Use   Vaping Use: Never used  Substance and Sexual Activity   Alcohol use: Yes    Comment: drinks a bottle of wine a day,sometimes too   Drug use: Never   Sexual activity: Not on file  Other Topics Concern   Not on file  Social History Narrative   Not on file   Social Determinants of Health   Financial Resource Strain: Not on file  Food Insecurity: Not on file  Transportation Needs: Not on file  Physical Activity: Not on file  Stress: Not on file  Social Connections: Not on  file   Additional Social History:    Allergies:   Allergies  Allergen Reactions   Penicillins Other (See Comments)    Unknown- childhood allergy    Labs:  Results for orders placed or performed during the hospital encounter of 06/17/21 (from the past 48 hour(s))  CBC with Differential     Status: Abnormal   Collection Time: 06/17/21  9:21 AM  Result Value Ref Range   WBC 8.3 4.0 - 10.5 K/uL   RBC 4.74 3.87 - 5.11 MIL/uL   Hemoglobin 14.8 12.0 - 15.0 g/dL   HCT 69.6 29.5 - 28.4 %   MCV 95.8 80.0 - 100.0 fL   MCH 31.2 26.0 - 34.0 pg   MCHC 32.6 30.0 - 36.0 g/dL   RDW 13.2 44.0 - 10.2 %   Platelets 417 (  H) 150 - 400 K/uL   nRBC 0.0 0.0 - 0.2 %   Neutrophils Relative % 70 %   Neutro Abs 5.8 1.7 - 7.7 K/uL   Lymphocytes Relative 18 %   Lymphs Abs 1.5 0.7 - 4.0 K/uL   Monocytes Relative 10 %   Monocytes Absolute 0.8 0.1 - 1.0 K/uL   Eosinophils Relative 1 %   Eosinophils Absolute 0.1 0.0 - 0.5 K/uL   Basophils Relative 1 %   Basophils Absolute 0.1 0.0 - 0.1 K/uL   Immature Granulocytes 0 %   Abs Immature Granulocytes 0.03 0.00 - 0.07 K/uL    Comment: Performed at [facility], [address], [place], [place] [address]  Comprehensive metabolic panel     Status: Abnormal   Collection Time: 06/17/21  9:21 AM  Result Value Ref Range   Sodium 136 135 - 145 mmol/L   Potassium 3.9 3.5 - 5.1 mmol/L   Chloride 98 98 - 111 mmol/L   CO2 27 22 - 32 mmol/L   Glucose, Bld 84 70 - 99 mg/dL    Comment: Glucose reference range applies only to samples taken after fasting for at least 8 hours.   BUN 15 8 - 23 mg/dL   Creatinine, Ser 6.04 (L) 0.44 - 1.00 mg/dL   Calcium 8.7 (L) 8.9 - 10.3 mg/dL   Total Protein 7.1 6.5 - 8.1 g/dL   Albumin 3.6 3.5 - 5.0 g/dL   AST 24 15 - 41 U/L   ALT 9 0 - 44 U/L   Alkaline Phosphatase 96 38 - 126 U/L   Total Bilirubin 0.4 0.3 - 1.2 mg/dL   GFR, Estimated >54 >09 mL/min    Comment: (NOTE) Calculated using the CKD-EPI Creatinine  Equation (2021)    Anion gap 11 5 - 15    Comment: Performed at [facility], [address], [place], [place] [address]  Magnesium     Status: None   Collection Time: 06/17/21  9:21 AM  Result Value Ref Range   Magnesium 2.2 1.7 - 2.4 mg/dL    Comment: Performed at [facility], [address] [place], [place] [address]  Vitamin B12     Status: None   Collection Time: 06/17/21  9:21 AM  Result Value Ref Range   Vitamin B-12 251 180 - 914 pg/mL    Comment: (NOTE) This assay is not validated for testing neonatal or myeloproliferative syndrome specimens for Vitamin B12 levels. Performed at [facility], [address], [place], [place] [address]   TSH     Status: None   Collection Time: 06/17/21  9:21 AM  Result Value Ref Range   TSH 0.762 0.350 - 4.500 uIU/mL    Comment: Performed by a 3rd Generation assay with a functional sensitivity of <=0.01 uIU/mL. Performed at [facility], [address], [place], [place] [address]   Ethanol     Status: None   Collection Time: 06/17/21 10:05 AM  Result Value Ref Range   Alcohol, Ethyl (B) <10 <10 mg/dL    Comment: (NOTE) Lowest detectable limit for serum alcohol is 10 mg/dL.  For medical purposes only. Performed at [facility], [address] [place], [place] [address]   Urinalysis, Routine w reflex microscopic     Status: Abnormal   Collection Time: 06/17/21 10:05 AM  Result Value Ref Range   Color, Urine YELLOW YELLOW   APPearance CLEAR CLEAR   Specific Gravity, Urine 1.015 1.005 -  1.030   pH 7.0 5.0 - 8.0   Glucose, UA 100 (A) NEGATIVE mg/dL   Hgb urine dipstick NEGATIVE NEGATIVE   Bilirubin Urine NEGATIVE NEGATIVE   Ketones, ur NEGATIVE NEGATIVE mg/dL   Protein, ur TRACE (A) NEGATIVE mg/dL   Nitrite NEGATIVE NEGATIVE   Leukocytes,Ua NEGATIVE NEGATIVE    Comment: Performed at [facility], [address], [place], [place]  [address]  Resp Panel by RT-PCR (Flu A&B, Covid) Nasopharyngeal Swab     Status: None   Collection Time: 06/17/21 10:05 AM   Specimen: Nasopharyngeal Swab; Nasopharyngeal(NP) swabs in vial transport medium  Result Value Ref Range   SARS Coronavirus 2 by RT PCR NEGATIVE NEGATIVE    Comment: (NOTE) SARS-CoV-2 target nucleic acids are NOT DETECTED.  The SARS-CoV-2 RNA is generally detectable in upper respiratory specimens during the acute phase of infection. The lowest concentration of SARS-CoV-2 viral copies this assay can detect is 138 copies/mL. A negative result does not preclude SARS-Cov-2 infection and should not be used as the sole basis for treatment or other patient management decisions. A negative result may occur with  improper specimen collection/handling, submission of specimen other than nasopharyngeal swab, presence of viral mutation(s) within the areas targeted by this assay, and inadequate number of viral copies(<138 copies/mL). A negative result must be combined with clinical observations, patient history, and epidemiological information. The expected result is Negative.  Fact Sheet for Patients:  BloggerCourse.comhttps://www.fda.gov/media/152166/download  Fact Sheet for Healthcare Providers:  SeriousBroker.ithttps://www.fda.gov/media/152162/download  This test is no t yet approved or cleared by the [place] FDA and  has been authorized for detection and/or diagnosis of SARS-CoV-2 by FDA under an Emergency Use Authorization (EUA). This EUA will remain  in effect (meaning this test can be used) for the duration of the COVID-19 declaration under Section 564(b)(1) of the Act, 21 U.S.C.section 360bbb-3(b)(1), unless the authorization is terminated  or revoked sooner.       Influenza A by PCR NEGATIVE NEGATIVE   Influenza B by PCR NEGATIVE NEGATIVE    Comment: (NOTE) The Xpert Xpress SARS-CoV-2/FLU/RSV plus assay is intended as an aid in the diagnosis of influenza from Nasopharyngeal swab  specimens and should not be used as a sole basis for treatment. Nasal washings and aspirates are unacceptable for Xpert Xpress SARS-CoV-2/FLU/RSV testing.  Fact Sheet for Patients: BloggerCourse.comhttps://www.fda.gov/media/152166/download  Fact Sheet for Healthcare Providers: SeriousBroker.ithttps://www.fda.gov/media/152162/download  This test is not yet approved or cleared by the [place] FDA and has been authorized for detection and/or diagnosis of SARS-CoV-2 by FDA under an Emergency Use Authorization (EUA). This EUA will remain in effect (meaning this test can be used) for the duration of the COVID-19 declaration under Section 564(b)(1) of the Act, 21 U.S.C. section 360bbb-3(b)(1), unless the authorization is terminated or revoked.  Performed at [facility], [address], [place], [place] [address]   Urine rapid drug screen (hosp performed)     Status: None   Collection Time: 06/17/21 10:05 AM  Result Value Ref Range   Opiates NONE DETECTED NONE DETECTED   Cocaine NONE DETECTED NONE DETECTED   Benzodiazepines NONE DETECTED NONE DETECTED   Amphetamines NONE DETECTED NONE DETECTED   Tetrahydrocannabinol NONE DETECTED NONE DETECTED   Barbiturates NONE DETECTED NONE DETECTED    Comment: (NOTE) DRUG SCREEN FOR MEDICAL PURPOSES ONLY.  IF CONFIRMATION IS NEEDED FOR ANY PURPOSE, NOTIFY LAB WITHIN 5 DAYS.  LOWEST DETECTABLE LIMITS FOR URINE DRUG SCREEN Drug Class  Cutoff (ng/mL) Amphetamine and metabolites    1000 Barbiturate and metabolites    200 Benzodiazepine                 200 Tricyclics and metabolites     300 Opiates and metabolites        300 Cocaine and metabolites        300 THC                            50 Performed at [facility], [address], [place], [place] [address]   Salicylate level     Status: Abnormal   Collection Time: 06/17/21 10:05 AM  Result Value Ref Range   Salicylate Lvl <7.0 (L) 7.0 - 30.0 mg/dL     Comment: Performed at [facility], [address], [place], [place] [address]  Acetaminophen level     Status: Abnormal   Collection Time: 06/17/21 10:05 AM  Result Value Ref Range   Acetaminophen (Tylenol), Serum <10 (L) 10 - 30 ug/mL    Comment: (NOTE) Therapeutic concentrations vary significantly. A range of 10-30 ug/mL  may be an effective concentration for many patients. However, some  are best treated at concentrations outside of this range. Acetaminophen concentrations >150 ug/mL at 4 hours after ingestion  and >50 ug/mL at 12 hours after ingestion are often associated with  toxic reactions.  Performed at [facility], [address], [place], [place] [address]     Medications:  Current Facility-Administered Medications  Medication Dose Route Frequency Provider Last Rate Last Admin   folic acid (FOLVITE) tablet 1 mg  1 mg Oral Daily Tanda Rockers A, DO   1 mg at 06/18/21 2130   LORazepam (ATIVAN) tablet 1-4 mg  1-4 mg Oral Q1H PRN Tanda Rockers A, DO   2 mg at 06/18/21 8657   Or   LORazepam (ATIVAN) injection 1-4 mg  1-4 mg Intravenous Q1H PRN Tanda Rockers A, DO       multivitamin with minerals tablet 1 tablet  1 tablet Oral Daily Tanda Rockers A, DO   1 tablet at 06/18/21 8469   nicotine (NICODERM CQ - dosed in mg/24 hours) patch 14 mg  14 mg Transdermal Once Tanda Rockers A, DO   14 mg at 06/18/21 6295   OLANZapine (ZYPREXA) tablet 5 mg  5 mg Oral QHS Tanda Rockers A, DO   5 mg at 06/17/21 2111   thiamine tablet 100 mg  100 mg Oral Daily Tanda Rockers A, DO   100 mg at 06/18/21 2841   Or   thiamine (B-1) injection 100 mg  100 mg Intravenous Daily Sloan Leiter, DO       Current Outpatient Medications  Medication Sig Dispense Refill   albuterol (VENTOLIN HFA) 108 (90 Base) MCG/ACT inhaler Inhale 2 puffs into the lungs every 6 (six) hours as needed for wheezing or shortness of breath. 8.5 g 2   doxycycline (VIBRAMYCIN) 100 MG capsule Take  1 capsule (100 mg total) by mouth 2 (two) times daily. 20 capsule 0   ibuprofen (ADVIL) 200 MG tablet Take 200 mg by mouth every 6 (six) hours as needed for mild pain or moderate pain.     OLANZapine (ZYPREXA) 5 MG tablet Take 1 tablet (5 mg total) by mouth at bedtime. 30 tablet 0    Musculoskeletal: Strength & Muscle Tone:  limited assessment via telepsych Gait & Station:  per chart,  patient has chronic ambulation problems due to paresthesias Patient leans: N/A  Psychiatric Specialty Exam:  Presentation  General Appearance: Fairly Groomed (in hospital scrubs but hair is not combed)  Eye Contact:Poor  Speech:Garbled  Speech Volume:Decreased  Handedness:Right   Mood and Affect  Mood:Irritable; Dysphoric  Affect:Congruent (sleeping)   Thought Process  Thought Processes:Coherent  Descriptions of Associations:Intact  Orientation:Partial (states her full name and aware she's at [facility] in [place] but does not know date/time.)  Thought Content:Delusions; Paranoid Ideation (per admission assessment and nursing notes,)  History of Schizophrenia/Schizoaffective disorder:Yes  Duration of Psychotic Symptoms:Greater than six months  Hallucinations:No data recorded Ideas of Reference:No data recorded Suicidal Thoughts:Suicidal Thoughts: No  Homicidal Thoughts:Homicidal Thoughts: No   Sensorium  Memory:Immediate Good  Judgment:Impaired  Insight:Lacking   Executive Functions  Concentration:Poor  Attention Span:Poor  Recall:Fair  Fund of Knowledge:Fair  Language:Fair   Psychomotor Activity  Psychomotor Activity:Psychomotor Activity: Normal   Assets  Assets:Financial Resources/Insurance; Social Support   Sleep  Sleep:Sleep: Fair Number of Hours of Sleep: 5    Physical Exam: Physical Exam Constitutional:      Appearance: Normal appearance.  Cardiovascular:     Rate and Rhythm: Normal rate.     Pulses: Normal pulses.  Pulmonary:      Effort: Pulmonary effort is normal.  Musculoskeletal:     Cervical back: Normal range of motion.  Neurological:     General: No focal deficit present.     Mental Status: She is alert.  Psychiatric:        Attention and Perception: She is inattentive.        Mood and Affect: Affect is flat.        Behavior: Behavior is agitated (agiated when interacting wth nursing, per notes). Behavior is cooperative.        Thought Content: Thought content is paranoid and delusional. Thought content does not include homicidal or suicidal ideation. Thought content does not include homicidal or suicidal plan.        Cognition and Memory: Cognition is impaired.        Judgment: Judgment is impulsive and inappropriate.   Review of Systems  Constitutional: Negative.   HENT: Negative.    Eyes: Negative.   Respiratory: Negative.    Cardiovascular: Negative.   Gastrointestinal: Negative.   Skin: Negative.   Neurological:        Chronic foot paresthesias that limits ambulation  Endo/Heme/Allergies: Negative.   Psychiatric/Behavioral:  Positive for substance abuse (patient endorses daily alcohol use).   Blood pressure 118/61, pulse 75, temperature 98.2 F (36.8 C), resp. rate 16, height  (1.549 m), weight 64 kg, SpO2 95 %. Body mass index is 26.64 kg/m.  Treatment Plan Summary: Patient with schizophrenia, presents with mental decline secondary to not taking her antipsychotic medications.  She was restarted on zyprexa for one day but no abrupt improvement in psychotic symptoms. Patient would benefit from gero-psychiatric admission for medication management and mood stability.  Daily contact with patient to assess and evaluate symptoms and progress in treatment and Medication management  Medications: CIWA -ativan protocol Olanzapine  po qhs for mood Thiamine  po daily Folic Acid  po daily     Disposition: Recommend psychiatric Inpatient admission when medically cleared.  This service  was provided via telemedicine using a 2-way, interactive audio and video technology.  Names of all persons participating in this telemedicine service and their role in this encounter. Name: LANGLEY FLATLEY Role: Patient  Name: [name]  Role: Patient's sister listed as POC  Name: Ophelia Shoulder Role: PMHNP  Name: Dr. Nelly Rout Role: Psychiatrist    Chales Abrahams, NP 06/18/2021 1:52 PM

## 2021-06-18 NOTE — ED Notes (Signed)
Pt reminded again that she had her afternoon snack already as she continues to ask for food. Pt states she needs her zyprexa. Pt informed it is ordered at bedtime and she will get it then.

## 2021-06-18 NOTE — ED Notes (Signed)
Pt asking for a sandwich, which was provided. Pt states that breakfast didn't have enough syrup for her pancakes, although pt ate them.

## 2021-06-18 NOTE — ED Notes (Signed)
Approximately every 5 minutes, pt screams "nurse" for small things, appearing to be attention seeking . Pt will continue to holler until someone responds, even if no one is visible at the time. Boundaries set with pt- she can ask for requests once an hour, unless there is an emergency. Clearly explained to pt that emergencies do not include wanting more sandwiches (pt has asked for 3 more) nor adjusting in the bed, as she is capable of doing it herself.

## 2021-06-18 NOTE — ED Notes (Signed)
Pt asking about her belongings. Pt informed her things are locked up in the lockers. Pt requesting her things, pt informed not until TTS eval

## 2021-06-18 NOTE — ED Provider Notes (Signed)
Patient became very agitated, she appears like she is experiencing psychosis, altered, trying to crawl out of the bed and at times to leave.  She has been recommended for inpatient Geri psych, she does not appear to be safe for discharge, appears to be a danger to herself and possibly others.  IVC placed.   Lorelle Gibbs, DO 06/18/21 2305

## 2021-06-18 NOTE — ED Notes (Addendum)
Pt had fallen asleep. PT at bedside to eval.

## 2021-06-18 NOTE — ED Notes (Signed)
Pt requesting another sandwich. Provided and informed that was her afternoon snack.

## 2021-06-18 NOTE — ED Notes (Addendum)
Complete linen change done. Pt was had soiled brief on,which was replaced with a new one, as well as provided a new gown

## 2021-06-18 NOTE — ED Notes (Signed)
Pt is now asking to go to the restroom via wheelchair.  She is able to get from the bed to the wheelchair independently with coaching. When pt is encouraged to stand up straight, she can. She was also able to shuffle a few steps.

## 2021-06-18 NOTE — ED Notes (Signed)
Pt now requesting hydrocodone for her hip. Pt informed there are no orders for that, perhaps a tylenol could work. Pt states she needs stronger pills.

## 2021-06-18 NOTE — Evaluation (Signed)
Physical Therapy Evaluation Patient Details Name: Angela CombesRebecca S Medina MRN: [id number] DOB: 01/07/1951 Today's Date: 06/18/2021  History of Present Illness  Pt is a 71 y/o female presenting to ED 06/17/21 with L foot pain and delusion that grocery store is attempting to poison her. PMH includes schizoaffective disorder, depression, alcohol and tobacco use, and recently homeless.   Clinical Impression  Patient presented to the ED on 06/17/21 with L foot pain and delusion that the grocery store is poisoning her. Pt's impairments include decreased cognition, balance, strength, and power. These are limiting her ability to safely and independently perform functional mobility. Patient required mod to max assist x2 during mobility tasks. Pt's knees buckling with transfer and gait. Buckling was inconsistent with other tasks assessed during session. When asked to scoot hips back in bed before laying down, pt stood at EOB without assist fully WB without buckling and brought hips to center of ED stretcher. No LE weakness or lag noted when pt performed supine straight leg raise bilaterally. SPT recommending SNF upon D/C due to pt's mobility deficits. PT will continue to follow acutely.        Recommendations for follow up therapy are one component of a multi-disciplinary discharge planning process, led by the attending physician.  Recommendations may be updated based on patient status, additional functional criteria and insurance authorization.  Follow Up Recommendations Skilled nursing-short term rehab (<3 hours/day)    Assistance Recommended at Discharge Frequent or constant Supervision/Assistance  Patient can return home with the following  A lot of help with walking and/or transfers;A lot of help with bathing/dressing/bathroom;Assistance with cooking/housework;Assistance with feeding;Direct supervision/assist for medications management;Direct supervision/assist for financial management;Assist for  transportation;Help with stairs or ramp for entrance    Equipment Recommendations Other (comment) (TBD based on pt progression.)  Recommendations for Other Services       Functional Status Assessment Patient has had a recent decline in their functional status and demonstrates the ability to make significant improvements in function in a reasonable and predictable amount of time.     Precautions / Restrictions Precautions Precautions: Fall Restrictions Weight Bearing Restrictions: No      Mobility  Bed Mobility Overal bed mobility: Needs Assistance, Independent Bed Mobility: Supine to Sit, Sit to Supine     Supine to sit: Max assist, +2 for physical assistance, +2 for safety/equipment Sit to supine: Min assist   General bed mobility comments: Pt required max Ax2 for elevation of trunk and bring legs to EOB. Pt also required max A to scoot hips to the EOB. Pt kept eyes closed during mobility and repeatedly reports "I want to lay back down". Pt able to lay back down on bed with min A for elevation of legs to EOB with cues to lay back. Pt able to bridge in bed and hold it when cued to help straighten her linens, showing no extensor chain weakness with activity.    Transfers Overall transfer level: Needs assistance Equipment used: 2 person hand held assist, Rolling walker (2 wheels) Transfers: Sit to/from Stand Sit to Stand: Max assist, +2 physical assistance, +2 safety/equipment, From elevated surface           General transfer comment: Pt required mod Ax2 for transfer with brief periods of max Ax2 when pt's knees buckled. Pt briefly grabbed RW, however, PT encouraged pt to use handheld assist to take side steps at EOB due to buckling. Buckling was inconsistent with other tasks assessed during session. When asked to scoot hips back in  bed before laying down, pt stood at EOB without assist fully WB without buckling and brought hips to center of ED stretcher. No LE weakness or lag  noted when pt performed supine straight leg raise bilaterally.    Ambulation/Gait Ambulation/Gait assistance: Max assist, +2 physical assistance, +2 safety/equipment   Assistive device: 2 person hand held assist Gait Pattern/deviations: Shuffle, Knee flexed in stance - right, Knee flexed in stance - left Gait velocity: decreased Gait velocity interpretation: <1.31 ft/sec, indicative of household ambulator   General Gait Details: Pt buckled frequently with side steps taken at EOB but would regain balance with mod Ax2. As previously stated, inconsistent with other mobility tasks where she was able to perform without assist.  Stairs            Wheelchair Mobility    Modified Rankin (Stroke Patients Only)       Balance Overall balance assessment: Needs assistance Sitting-balance support: Bilateral upper extremity supported, Single extremity supported, Feet unsupported Sitting balance-Leahy Scale: Poor Sitting balance - Comments: pt initially pushing posteriorly against max Ax2 to keep pt upright in sitting. Brief periods where the pt able to hold trunk up with min A and bilateral UE support Postural control: Posterior lean Standing balance support: Bilateral upper extremity supported, During functional activity Standing balance-Leahy Scale: Poor Standing balance comment: pt required mod Ax2 for balance in steadying when knees buckled. Pt had brief periods of min A with standing before knees would buckle again.                             Pertinent Vitals/Pain Pain Assessment Pain Assessment: Faces Faces Pain Scale: No hurt    Home Living Family/patient expects to be discharged to:: Unsure                   Additional Comments: Pt kept eyes closed throughout session. She limited her participation and was inconsistent with answering questions.    Prior Function Prior Level of Function : Patient poor historian/Family not available              Mobility Comments: pt did report once during session that she uses a walker. Then reported she was paralyzed. Unable to determine type or if it is still available once D/C. ADLs Comments: pt reports "she thinks so" when asked if she was able to dress herself prior to coming to the hospital.     Hand Dominance        Extremity/Trunk Assessment   Upper Extremity Assessment Upper Extremity Assessment: Generalized weakness;Difficult to assess due to impaired cognition (When pt asked to squeeze SPT fingers, she was uncooperative, however, she was able to grasp firmly on RW during transfers.)    Lower Extremity Assessment Lower Extremity Assessment: LLE deficits/detail;RLE deficits/detail;Generalized weakness RLE Deficits / Details: Pt able to perform straight leg raise, heel slide, and ankle pumps without limits in range of motion or strength deficit noted. However, inconsistent with transfer where pt's knees repeatedly buckled R>L LE LLE Deficits / Details: Pt able to perform straight leg raise, heel slide, and ankle pumps without limits in range of motion or strength deficit noted. However, inconsistent with transfer where pt's knees repeatedly buckled R>L LE    Cervical / Trunk Assessment Cervical / Trunk Assessment: Kyphotic  Communication   Communication: Other (comment) (mumbles in a low voice)  Cognition Arousal/Alertness: Lethargic (woke pt up to begin session. She kept her eyes closed throughout  session and did not want to participate reporting "I want to lay back down") Behavior During Therapy: Agitated Overall Cognitive Status: History of cognitive impairments - at baseline                                 General Comments: schizoaffective at baseline; per notes, pt not taking medications anymore. Inconsistencies noted throughout session.        General Comments General comments (skin integrity, edema, etc.): Pt was limited in participation during session. Kept  reporting that she was paralyzed. When asked where she was paralyzed, she would mumble incoherently.    Exercises     Assessment/Plan    PT Assessment Patient needs continued PT services  PT Problem List Decreased strength;Decreased range of motion;Decreased activity tolerance;Decreased balance;Decreased mobility;Decreased coordination;Decreased cognition;Decreased knowledge of use of DME;Decreased safety awareness;Decreased knowledge of precautions       PT Treatment Interventions DME instruction;Gait training;Functional mobility training;Stair training;Therapeutic activities;Therapeutic exercise;Balance training;Neuromuscular re-education;Cognitive remediation;Patient/family education    PT Goals (Current goals can be found in the Care Plan section)  Acute Rehab PT Goals PT Goal Formulation: Patient unable to participate in goal setting Time For Goal Achievement: 06/25/21 Potential to Achieve Goals: Fair    Frequency Min 3X/week     Co-evaluation               AM-PAC PT "6 Clicks" Mobility  Outcome Measure Help needed turning from your back to your side while in a flat bed without using bedrails?: None Help needed moving from lying on your back to sitting on the side of a flat bed without using bedrails?: Total Help needed moving to and from a bed to a chair (including a wheelchair)?: Total Help needed standing up from a chair using your arms (e.g., wheelchair or bedside chair)?: Total Help needed to walk in hospital room?: Total Help needed climbing 3-5 steps with a railing? : Total 6 Click Score: 9    End of Session Equipment Utilized During Treatment: Gait belt Activity Tolerance: Patient limited by lethargy;Other (comment) (decreased engagement) Patient left: in bed (on stretcher in ED hallway next to nursing station) Nurse Communication: Mobility status PT Visit Diagnosis: Unsteadiness on feet (R26.81);Other abnormalities of gait and mobility (R26.89);Muscle  weakness (generalized) (M62.81);Difficulty in walking, not elsewhere classified (R26.2)    Time: 0076-2263 PT Time Calculation (min) (ACUTE ONLY): 17 min   Charges:   PT Evaluation $PT Eval Moderate Complexity: [address] SPT  [place] 06/18/2021, 12:58 PM

## 2021-06-18 NOTE — ED Notes (Signed)
Pt repositioned herself in bed with significant encouragement.

## 2021-06-18 NOTE — ED Notes (Signed)
Pt asleep.

## 2021-06-18 NOTE — ED Notes (Signed)
Pt threw milk on the ground then asked for another. Pt told this was not acceptable behavior, as pt had done the same last night. Pt also states that she couldn't straighten herself up in bed.However, the pt had been able to reposition herself easily when being changed.

## 2021-06-18 NOTE — ED Notes (Signed)
Pt called this RN a "nasty bitch" after RN told her her belongings are locked up until she is evaluated by TTS.

## 2021-06-18 NOTE — ED Notes (Signed)
Breakfast order placed ?

## 2021-06-18 NOTE — Progress Notes (Signed)
Inpatient Behavioral Health Placement  Pt meets inpatient criteria per Ophelia Shoulder, NP. There are no appropriate beds at [facility] per [facility] Rona Ravens, RN.  Referral was sent to the following facilities;    Service Provider Address Phone [facility]  [address] [place] [address] [phone]  [facility]  [address] [place], [place] [address] [phone]  [facility]  [address] [place] [address] [phone]  [facility]  [address] [place] [address] [phone]  [facility]  [address] [place], [place] [address] [phone]  [facility]  [address] [place] [address] [phone]  [facility]  [address] [place] [address] [phone]  [facility]  [address], [place] [address] [phone]  [facility]  [address] [place] [address] [phone]  [facility]  [address] [place] [address] [phone]  [facility]  [address] [place] [address] [phone]  [facility]  [address] [place] [address] [phone]  [facility]  [address] [place], [place] [address] [phone]  [facility]  [address] [place] [address] [phone]  [facility]  [address] [place] [address] [phone]    Situation ongoing,  CSW will follow up.   Maryjean Ka, MSW, LCSWA 06/18/2021  @ 2:00 PM

## 2021-06-18 NOTE — ED Notes (Signed)
Pt is trying to convinced that she has orange juice, while she has applesauce. Continues to holler at staff about OJ.

## 2021-06-18 NOTE — ED Notes (Signed)
Pt threw apple sauce on the floor , then began hollering for someone to pick it up.

## 2021-06-18 NOTE — ED Provider Notes (Signed)
Emergency Medicine Observation Re-evaluation Note  Angela Medina is a 71 y.o. female, seen on rounds today.  Pt initially presented to the ED for complaints of Foot Pain and Delusional (Pt presented to ED with apparent delusion -- reported that she is being poisoned with strychnine through food and wine.  ) Currently, the patient is sleeping.  Physical Exam  BP 116/76    Pulse 76    Temp 98.1 F (36.7 C) (Oral)    Resp (!) 22    SpO2 96%  Physical Exam General: Nondistressed, sleeping Cardiac: Extremities well perfused Lungs: Breathing is unlabored Psych: Deferred  ED Course / MDM  EKG:EKG Interpretation  Date/Time:  Sunday June 17 2021 09:46:34 EST Ventricular Rate:  71 PR Interval:  50 QRS Duration: 106 QT Interval:  400 QTC Calculation: 435 R Axis:   11 Text Interpretation: Sinus rhythm Short PR interval Right atrial enlargement Left ventricular hypertrophy Borderline T abnormalities, inferior leads Anterior ST elevation, probably due to LVH Artifact in lead(s) I II III aVR aVL aVF V1 V2 V5 Morphology appears similar to prior EKG although Interpretation limited secondary to artifact Confirmed by Tanda Rockers ([id number]) on 06/17/2021 10:23:17 AM  I have reviewed the labs performed to date as well as medications administered while in observation.  Recent changes in the last 24 hours include patient presented yesterday in transfer from med center [facility] for PT and psychiatric evaluation.  She has been started on Zyprexa.  Plan  Current plan is for psychiatric reevaluation this morning.  SUNNA SHAMOUN is not under involuntary commitment.     Gloris Manchester, MD 06/18/21 [phone]

## 2021-06-18 NOTE — ED Notes (Signed)
Pt belongings in locker 5 and valuables with security

## 2021-06-18 NOTE — ED Notes (Signed)
Now that pt is back in bed, she states her left leg paralyzed now. Pt continues to move legs and arms independently. Pt was requesting to sit in a chair, but pt was told that if she is paralyzed, she cannot sit in a chair independently. Pt placed back in bed.

## 2021-06-18 NOTE — ED Notes (Signed)
Pt again asking about her belongings. 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

## 2021-06-18 NOTE — ED Notes (Signed)
Pt ate all of her lunch and requested to have it removed. Within 10 minutes of asking for it to be removed, pt started demanding another lunch.

## 2021-06-19 ENCOUNTER — Encounter (HOSPITAL_COMMUNITY): Payer: Self-pay

## 2021-06-19 LAB — URINALYSIS, ROUTINE W REFLEX MICROSCOPIC
Bilirubin Urine: NEGATIVE
Glucose, UA: 50 mg/dL — AB
Hgb urine dipstick: NEGATIVE
Ketones, ur: NEGATIVE mg/dL
Leukocytes,Ua: NEGATIVE
Nitrite: NEGATIVE
Protein, ur: NEGATIVE mg/dL
Specific Gravity, Urine: 1.014 (ref 1.005–1.030)
pH: 7 (ref 5.0–8.0)

## 2021-06-19 NOTE — ED Notes (Signed)
Assumed pt care at this time

## 2021-06-19 NOTE — ED Notes (Signed)
Pt complaining of burning w/ urination and frequency, notified EDP

## 2021-06-19 NOTE — ED Provider Notes (Signed)
°  Physical Exam  BP 98/66    Pulse 88    Temp 98.6 F (37 C) (Oral)    Resp 19    Ht 5\' 1"  (1.549 m)    Wt 64 kg    SpO2 95%    BMI 26.64 kg/m   Physical Exam  Procedures  Procedures  ED Course / MDM    Medical Decision Making Amount and/or Complexity of Data Reviewed Labs: ordered.  Risk OTC drugs. Prescription drug management.   Patient is under IVC.  Pending inpatient Geri psych placement.       Benjiman Core, MD 06/19/21 [phone]

## 2021-06-19 NOTE — ED Notes (Signed)
Mittens placed on pt to help prevent pt from pulling on side rails and crawling out of bed d/t high fall risk

## 2021-06-19 NOTE — ED Notes (Signed)
Pt ambulated to bathroom w/ walker.

## 2021-06-19 NOTE — Progress Notes (Signed)
Pt denied at [facility].  Maryjean Ka, MSW, [facility] 06/19/2021 11:03 PM

## 2021-06-20 MED ORDER — LORAZEPAM 1 MG PO TABS
1.0000 mg | ORAL_TABLET | Freq: Once | ORAL | Status: AC
  Administered 2021-06-20: 1 mg via ORAL
  Filled 2021-06-20: qty 1

## 2021-06-20 MED ORDER — OLANZAPINE 5 MG PO TABS
10.0000 mg | ORAL_TABLET | Freq: Every day | ORAL | Status: DC
  Administered 2021-06-20 – 2021-06-27 (×8): 10 mg via ORAL
  Filled 2021-06-20 (×8): qty 2

## 2021-06-20 NOTE — Progress Notes (Signed)
Patient has been denied by [facility] due to staffing and the patient requiring one to one supervision. [facility] meets [facility] criteria per Ophelia ShoulderShnese Mills, NP. Patient has been faxed out to the following facilities:  ? ?[facility]  [address], [place] [address] [phone]  ?[facility]  [address], [place] [address] [phone]  ?[facility]  [address] [place] [address] [phone]  ?[facility]  [address], [place] [address] [phone]  ?[facility]  [address], [place] [address] [phone]  ?[facility]  [address] [place] [address] [phone]  ?[facility]  [address] [place] [address] [phone]  ?[facility]  [address] [place] [address] [phone]  ?[facility]  [address], [place] [address] [phone]  ?[facility]  [address] [place] [address] [phone]  ?[facility]  [address], [place] [address] [phone]  ?[facility]  [address], [place] [address] [phone]  ?[facility]  [address], [place] [address] [phone]  ?[facility]  [address] [place] [address] [phone]  ?[facility]  [address] [place] [address] [phone]  ? ?Angela Medina, MSW, LCSW-A  ?10:28 AM 06/20/2021   ?

## 2021-06-20 NOTE — ED Provider Notes (Signed)
Emergency Medicine Observation Re-evaluation Note ? ?Angela Medina is a 71 y.o. female, seen on rounds today.  Pt initially presented to the ED for complaints of Foot Pain and Delusional (Pt presented to ED with apparent delusion -- reported that she is being poisoned with strychnine through food and wine.  ) ?Currently, the patient is lying in bed, talking to herself. ? ?Physical Exam  ?BP 140/88 (BP Location: Right Arm)   Pulse 69   Temp 99.1 ?F (37.3 ?C) (Oral)   Resp 18   Ht 5\' 1"  (1.549 m)   Wt 64 kg   SpO2 96%   BMI 26.64 kg/m?  ?Physical Exam ?General: Nontoxic appearance ?Cardiac: Normal heart rate ?Lungs: Normal respiratory rate ?Psych: Responding to internal stimuli ? ?ED Course / MDM  ?EKG:EKG Interpretation ? ?Date/Time:  Sunday June 17 2021 09:46:34 EST ?Ventricular Rate:  71 ?PR Interval:  50 ?QRS Duration: 106 ?QT Interval:  400 ?QTC Calculation: 435 ?R Axis:   11 ?Text Interpretation: Sinus rhythm Short PR interval Right atrial enlargement Left ventricular hypertrophy Borderline T abnormalities, inferior leads Anterior ST elevation, probably due to LVH Artifact in lead(s) I II III aVR aVL aVF V1 V2 V5 Morphology appears similar to prior EKG although Interpretation limited secondary to artifact Confirmed by Tanda Rockers ([id number]) on 06/17/2021 10:23:17 AM ? ?I have reviewed the labs performed to date as well as medications administered while in observation.  Recent changes in the last 24 hours include remaining cooperative. ? ?Plan  ?Current plan is for placement in a psychiatric facility. ?Angela Medina is under involuntary commitment. ?  ? ?  ?Mancel Bale, MD ?06/20/21 0830 ? ?

## 2021-06-20 NOTE — ED Notes (Signed)
Pt refusing to scoot back in the bed, arguing with sitter. Dr. Armandina Gemma notified, see MAR.  ?
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
## 2021-06-21 DIAGNOSIS — F259 Schizoaffective disorder, unspecified: Secondary | ICD-10-CM

## 2021-06-21 LAB — TROPONIN I (HIGH SENSITIVITY)
Troponin I (High Sensitivity): 15 ng/L (ref <18)
Troponin I (High Sensitivity): 15 ng/L (ref <18)

## 2021-06-21 MED ORDER — LORAZEPAM 1 MG PO TABS
1.0000 mg | ORAL_TABLET | Freq: Four times a day (QID) | ORAL | Status: DC | PRN
  Administered 2021-06-21 – 2021-06-27 (×9): 1 mg via ORAL
  Filled 2021-06-21 (×9): qty 1

## 2021-06-21 MED ORDER — HALOPERIDOL 5 MG PO TABS
5.0000 mg | ORAL_TABLET | Freq: Once | ORAL | Status: DC
  Filled 2021-06-21: qty 1

## 2021-06-21 MED ORDER — DOCUSATE SODIUM 100 MG PO CAPS
100.0000 mg | ORAL_CAPSULE | Freq: Once | ORAL | Status: AC
  Administered 2021-06-21: 100 mg via ORAL
  Filled 2021-06-21: qty 1

## 2021-06-21 MED ORDER — HALOPERIDOL LACTATE 5 MG/ML IJ SOLN: 5.0000 mg | Freq: Once | INTRAMUSCULAR | Status: DC

## 2021-06-21 MED ORDER — OLANZAPINE 5 MG PO TABS
2.5000 mg | ORAL_TABLET | Freq: Every day | ORAL | Status: DC
  Administered 2021-06-21 – 2021-06-27 (×7): 2.5 mg via ORAL
  Filled 2021-06-21 (×7): qty 1

## 2021-06-21 NOTE — ED Notes (Signed)
Pt refused daily meds and is only requesting ativan. Will provide PO ativan. ?

## 2021-06-21 NOTE — Progress Notes (Signed)
[facility]. [facility] meets [facility] criteria per Ophelia ShoulderShnese Mills, NP. Patient has been faxed out to the following facilities:  ? ? ?[facility]  [address], [place] [address] [phone]  ?[facility]  [address], [place] [address] [phone]  ?[facility]  [address] [place] [phone]  ?[facility]  [address], [place] [address] [phone]  ?[facility]  [address], [place] [address] [phone]  ?[facility]  [address] [place] [address] [phone]  ?[facility]  [address] [place] [address] [phone]  ?[facility]  [address] [place] [phone]  ?[facility]  [address], [place] [address] [phone]  ?[facility]  [address] [place] [address] [phone]  ?[facility]  [address], [place] [address] [phone]  ?[facility]  [address], [place] [address] [phone]  ?[facility]  [address], [place] [address] [phone]  ?[facility]  [address] [place] [phone]  ?[facility]  [address] [place] [address] [phone]  ? ?Damita Dunningseneka Jordin Vicencio, MSW, LCSW-A  ?12:31 PM 06/21/2021   ?

## 2021-06-21 NOTE — ED Provider Notes (Signed)
Emergency Medicine Observation Re-evaluation Note ? ?Angela Medina is a 71 y.o. female, seen on rounds today.  Pt initially presented to the ED for complaints of Foot Pain and Delusional (Pt presented to ED with apparent delusion -- reported that she is being poisoned with strychnine through food and wine.  ) ?Currently, the patient is resting comfortably in a bedside chair.  She has her legs elevated.  She denies chest pain, or shortness of breath at this time.. ? ?Physical Exam  ?BP 135/82 (BP Location: Right Arm)   Pulse 75   Temp 98.4 ?F (36.9 ?C) (Oral)   Resp 20   Ht 5\' 1"  (1.549 m)   Wt 64 kg   SpO2 94%   BMI 26.64 kg/m?  ?Physical Exam ?General: Nontoxic appearing ?Cardiac: Normal heart rate ?Lungs: Normal respiratory rate ?Psych: Not currently responding to internal stimuli ? ?ED Course / MDM  ?EKG:EKG Interpretation ? ?Date/Time:  Sunday June 17 2021 09:46:34 EST ?Ventricular Rate:  71 ?PR Interval:  50 ?QRS Duration: 106 ?QT Interval:  400 ?QTC Calculation: 435 ?R Axis:   11 ?Text Interpretation: Sinus rhythm Short PR interval Right atrial enlargement Left ventricular hypertrophy Borderline T abnormalities, inferior leads Anterior ST elevation, probably due to LVH Artifact in lead(s) I II III aVR aVL aVF V1 V2 V5 Morphology appears similar to prior EKG although Interpretation limited secondary to artifact Confirmed by Tanda Rockers ([id number]) on 06/17/2021 10:23:17 AM ? ?I have reviewed the labs performed to date as well as medications administered while in observation.  Recent changes in the last 24 hours include an EKG was ordered at 2 AM, reportedly for chest pain.  There is no associated documentation by emergency provider or nursing staff.  The EKG was ultimately done at 11:06 AM.  I have reviewed the EKG.  EKG is remarkable for LVH, with nonspecific ST elevation.  The anterior ST elevation appears somewhat more prominent than the last EKG that I am able to observe in the EMR.  I do not  believe that this is evidence for acute ischemia or infarct.  Since the patient is a somewhat difficult historian we will send troponins to make sure that there is no NSTEMI.  Vital signs last done at 6:54 AM are normal.  Will repeat vital signs. ?Delta troponin negative ? ?Plan  ?Current plan is for assess for occult MI.  Psychiatric placement.Marland Kitchen ?Angela Medina is under involuntary commitment. ?  ? ?  ?Mancel Bale, MD ?06/22/21 1033 ? ?

## 2021-06-21 NOTE — ED Notes (Signed)
NT obtained EKG from order that was not carried out last night from night shift. Pt denies CP. Appears comfortable in chair at this time. EDP evaluated EKG and pt. Will obtain troponin. ?
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
## 2021-06-22 LAB — URINE CULTURE: Culture: 40000 — AB

## 2021-06-22 NOTE — ED Notes (Signed)
Pt called out for this RN to come talk to her. Pt stated she needed, "something for my anxiety. Can I get a medication early?" Pt was educated that some medications can not be given early, but the MAR could be checked to see what medications might be available for the pt to take.  ?

## 2021-06-22 NOTE — ED Provider Notes (Signed)
Emergency Medicine Observation Re-evaluation Note ? ?Angela CombesRebecca S Medina is a 71 y.o. female, seen on rounds today.  Pt initially presented to the ED for complaints of Foot Pain and Delusional (Pt presented to ED with apparent delusion -- reported that she is being poisoned with strychnine through food and wine.  ) ?Currently, the patient is Sitting up, eating breakfast. ? ?Physical Exam  ?BP 133/74 (BP Location: Right Arm)   Pulse 66   Temp 98 ?F (36.7 ?C)   Resp 18   Ht 5\' 1"  (1.549 m)   Wt 64 kg   SpO2 100%   BMI 26.64 kg/m?  ?Physical Exam ?General: No distress ?Lungs: Even and unlabored ?Psych: Calm and cooperative ? ?ED Course / MDM  ?EKG:EKG Interpretation ? ?Date/Time:  Thursday June 21 2021 11:06:37 EST ?Ventricular Rate:  76 ?PR Interval:  162 ?QRS Duration: 100 ?QT Interval:  370 ?QTC Calculation: 416 ?R Axis:   -33 ?Text Interpretation: Sinus rhythm with Premature atrial complexes Left axis deviation Left ventricular hypertrophy ( R in aVL , Cornell product , Romhilt-Estes ) Nonspecific ST and T wave abnormality Abnormal ECG since last tracing no significant change Confirmed by Mancel BaleWentz, Elliott [phone]([id number] on 06/21/2021 11:36:35 AM ? ?I have reviewed the labs performed to date as well as medications administered while in observation.  Recent changes in the last 24 hours include EKG done yesterday for chest pains, Trop negative. ? ?Plan  ?Current plan is for Geripsych placement. ?Angela CombesRebecca S Medina is under involuntary commitment. ?  ? ?  ?Pollyann SavoySheldon, Wade Sigala B, MD ?06/22/21 [phone] ? ?

## 2021-06-22 NOTE — BH Assessment (Signed)
Contacted RN Merton Borderrthur to set up TTS re-assessment. Cart was in use [facility](MC still down one cart due to pt breaking the PEDs cart a day or so ago.) RN will contact TTS when cart is free and hopefully, TTS will be free then to see pt.  ? ?Shanedra Lave T. Jimmye NormanFarmer, MS, [facility], CRC ?Triage Specialist ?[place]y ? ?

## 2021-06-22 NOTE — Progress Notes (Signed)
Patient has been denied by [facility] due to no beds available. Patient meets [facility] inpatient criteria per Ophelia Shoulder, NP. Patient has been faxed out to the following facilities:  ? ? ?[facility]  [address] [place] [address] [phone]  ?[facility]  [address] [place], [place] [address] [phone]  ?[facility] [address] [place] [address] [phone]  ?[facility]  [address] [place], [place] [address] [phone]  ?[facility]  [address] [place], [place] [address] [phone]  ?[facility]  [address] [place] [address] [phone]  ?[facility]  [address] [place] [address] [phone]  ?[facility]  [address], [place] [address] [phone]  ?[facility]  [address], [place] [address] [phone]  ?[facility]  [address] [place]., [place] [address] [phone]  ?[facility]  [address] [place] [address] [phone]  ?[facility]  [address] [place] [address] [phone]  ?[facility]  [address] [place], [place] [address] [phone]  ?[facility]  [address] [place] [address] [phone]  ?[facility]  [address], [place] [address] [phone]  ? ?Damita Dunnings, MSW, LCSW-A  ?3:31 PM 06/22/2021   ?

## 2021-06-23 NOTE — ED Provider Notes (Signed)
Emergency Medicine Observation Re-evaluation Note ? ?Angela CombesRebecca S Medina is a 71 y.o. female, seen on rounds today.  Pt initially presented to the ED for complaints of Foot Pain and Delusional (Pt presented to ED with apparent delusion -- reported that she is being poisoned with strychnine through food and wine.  ) ?Currently, the patient is resting. ? ?Physical Exam  ?BP (!) 167/104 (BP Location: Right Arm)   Pulse 70   Temp 98.4 ?F (36.9 ?C)   Resp 20   Ht 5\' 1"  (1.549 m)   Wt 64 kg   SpO2 98%   BMI 26.64 kg/m?  ?Physical Exam ?General: NAD ?Lungs: Even and unlabored ?Psych: No agitation ? ?ED Course / MDM  ?EKG:EKG Interpretation ? ?Date/Time:  Thursday June 21 2021 11:06:37 EST ?Ventricular Rate:  76 ?PR Interval:  162 ?QRS Duration: 100 ?QT Interval:  370 ?QTC Calculation: 416 ?R Axis:   -33 ?Text Interpretation: Sinus rhythm with Premature atrial complexes Left axis deviation Left ventricular hypertrophy ( R in aVL , Cornell product , Romhilt-Estes ) Nonspecific ST and T wave abnormality Abnormal ECG since last tracing no significant change Confirmed by Mancel BaleWentz, Elliott [phone]([id number]) on 06/21/2021 11:36:35 AM ? ?I have reviewed the labs performed to date as well as medications administered while in observation.  Recent changes in the last 24 hours include patient meets inpatient criteria per psychiatry, social work coordinating placement. ? ?Plan  ?Current plan is for Geripsych placement. ?Angela CombesRebecca S Medina is under involuntary commitment. ?  ? ?  ? ? ?  ?Ernie AvenaLawsing, Zlata Alcaide, MD ?06/23/21 1452 ? ?

## 2021-06-23 NOTE — ED Notes (Signed)
Lunch tray delivered to pt at this time.

## 2021-06-24 ENCOUNTER — Emergency Department (HOSPITAL_COMMUNITY): Payer: Medicare Other

## 2021-06-24 MED ORDER — DIVALPROEX SODIUM 125 MG PO CSDR
250.0000 mg | DELAYED_RELEASE_CAPSULE | Freq: Every day | ORAL | Status: DC
  Administered 2021-06-24 – 2021-06-27 (×4): 250 mg via ORAL
  Filled 2021-06-24 (×4): qty 2

## 2021-06-24 NOTE — ED Notes (Signed)
Pt calls this RN to bedside and states to this RN that she has "a large amount of money" that her sister left her when she died that is to be used for her to go to a facility and wants to know what facility she is going to. This RN let pt know that they are working on finding placement for pt. Pt asking for coke at this time. Pt reminded that she has already had her snack for the night and she can drink the water at bedside. Pt yells at this RN that "you are torturing me!"  ?

## 2021-06-24 NOTE — ED Provider Notes (Signed)
At 6:20 AM on 06/24/2021 I was called to the room after the patient suffered a fall.  Patient had a mechanical fall while attempting to use the restroom and struck the back of her head on the cabinet.  We will obtain a CT head due to high risk mechanism and a 71 year old patient. ?  Teressa Lower, MD ?06/24/21 [phone] ? ?

## 2021-06-24 NOTE — ED Provider Notes (Signed)
Emergency Medicine Observation Re-evaluation Note ? ?Angela Medina is a 71 y.o. female, seen on rounds today.  Pt initially presented to the ED for complaints of Foot Pain and Delusional (Pt presented to ED with apparent delusion -- reported that she is being poisoned with strychnine through food and wine.  ) ?Currently, the patient is resting. ? ?Physical Exam  ?BP (!) 130/92 (BP Location: Left Arm)   Pulse 77   Temp 97.8 ?F (36.6 ?C)   Resp 16   Ht 5\' 1"  (1.549 m)   Wt 64 kg   SpO2 98%   BMI 26.64 kg/m?  ?Physical Exam ?General: NAD ?Lungs: Even and unlabored ?Psych: No agitation ? ?ED Course / MDM  ?EKG:EKG Interpretation ? ?Date/Time:  Thursday June 21 2021 11:06:37 EST ?Ventricular Rate:  76 ?PR Interval:  162 ?QRS Duration: 100 ?QT Interval:  370 ?QTC Calculation: 416 ?R Axis:   -33 ?Text Interpretation: Sinus rhythm with Premature atrial complexes Left axis deviation Left ventricular hypertrophy ( R in aVL , Cornell product , Romhilt-Estes ) Nonspecific ST and T wave abnormality Abnormal ECG since last tracing no significant change Confirmed by Mancel Bale [id number]) on 06/21/2021 11:36:35 AM ? ?I have reviewed the labs performed to date as well as medications administered while in observation.  Recent changes in the last 24 hours include patient had a fall overnight and struck her head on the back of a cabinet.  ? ?CT Head did not reveal any acute intracranial abnormalities.  ?Plan  ?Current plan is for Geripsych placement. ?Angela Medina is under involuntary commitment. ?  ? ?  ? ? ? ? ?  ?Ernie Avena, MD ?06/24/21 1202 ? ?

## 2021-06-24 NOTE — ED Notes (Signed)
Patient continues to walk to door using walker and noticeably unsteady; This RN and several staff has continued asked patient not to get up unassisted; RN removed BSC from at the door closer to the patient for safety; Patient was upset with this RN for moving [facility] closer to the bed-Monique,RN  ?

## 2021-06-24 NOTE — ED Notes (Signed)
PT calm and cooperative throughout the AM. Responding to safe limit setting and follows commands appropriately. Pt denies AVH, no psychosis observed by this RN ?

## 2021-06-24 NOTE — Consult Note (Signed)
Reached out to patient's nurse Miki Kins, RN who reports patient's behaviors have improved.  Requested to see patient for psychiatric reassessment via TTS cart.  Per nurse, cart being utilized and she will alert me when it becomes available.    ?

## 2021-06-24 NOTE — ED Notes (Signed)
Contacted MD Lawsing for speech evaluation. Pt has been coughing throughout all 3 meals today. Is able to clear secretions and food, and does eventually swallow-however cough is wet and productive. Pt appears to eat very quickly and does not chew food thoroughly. Always placing next bite into mouth prior to chewing and swallowing previous bite. No S/sx of PNA, afebrile, LS  CTAB however would benefit from speech eval and poss swallow study based on recs ?

## 2021-06-24 NOTE — Progress Notes (Signed)
Per Vivien Presto, patient meets criteria for inpatient treatment. There are no available or appropriate beds at [facility] today. CSW re-faxed referrals to the following facilities for review: ? ?[facility]  Pending [employer] N/A [address] [place] [address] [phone] --  ?[facility]  Pending - Request Sent N/A [address] [place], [place] [address] [phone] --  ?[facility]  Pending - Request Sent N/A [address] [place] [address] [phone] --  ?[facility]  Pending - Request Sent N/A [address] [place] [address] [phone] --  ?[facility]  Pending - Request Sent N/A [address] [place], [place] [address] [phone] --  ?[facility]  Pending - Request Sent N/A [address] [place] [address] [phone] --  ?[facility]  Pending - Request Sent N/A [address] [place] [address] [phone] --  ?[facility]  Pending - Request Sent N/A [address] [place] [address] [phone] --  ?[facility]  Pending - Request Sent N/A [address] [place] [address] [phone] --  ?[facility]  Pending - Request Sent N/A [address], [place] [address] [phone] --  ?[facility]  Pending - Request Sent N/A [address] [place] [address] [phone] --  ?[facility]  Pending - Request Sent N/A [address], [place] [address] [phone] --  ?[facility]  Pending - Request Sent N/A [address] [place], [place] [address] [phone] --  ?[facility]  Pending - Request Sent N/A [address] [place] [address] [phone] --  ?[facility]  Pending - Request Sent N/A [address] [place] [address] [phone] --  ? ? ?TTS will continue to seek bed placement. ? ?Crissie Reese, MSW, LCSW-A, LCAS-A ?Phone: [phone] ?Disposition/TOC ? ?

## 2021-06-24 NOTE — ED Notes (Signed)
Pt compliant with taking night time medications but restless and wanting to continuously get out of bed, trying to get this RN and sitter to let her bed rail down. Pt reminded that these things are in place for her safety and so that she does not have another fall. Pt asking for more snacks but reminded that she has already been given her night time snack - pt yells to this RN that we are treating her like an "imbecile" - This RN reiterated rules and guidelines for pt at this time - Pt cooperative and remains in bed at this time - pt sitter remains at bedside  ?

## 2021-06-24 NOTE — ED Notes (Signed)
Pt calling out in room, calling out to this RN and calling out to sitter - pt having conversations with either herself or someone not present in room - pt states that she needs something to help her relax - PRN Ativan given at the time - pt repositioned and laying in bed at this time  ?

## 2021-06-24 NOTE — ED Notes (Signed)
RN witnessed patient pushing BSC back to the door way; RN advised patient not move BSC again and BSC placed back near the patient bed for safety-Monique,RN  ?

## 2021-06-24 NOTE — ED Notes (Signed)
Dinner ordered--Regular diet ?

## 2021-06-24 NOTE — ED Notes (Signed)
-  Pt received night time snack and drink.  ?

## 2021-06-24 NOTE — ED Notes (Signed)
RN heard scream from patient and was informed by sitter that patient was on the floor; RN saw patient face up lying on the floor in the doorway; When asked what she was doing patient states "I was going to the bathroom" however the [facility] was no where near patient. Patient was at the door; EDP notified of fall; pt redirected concerning calling out and waiting for help to ambulate in room; patient placed back in bed and bed alarm turned on for safety-Monique,RN  ?

## 2021-06-24 NOTE — ED Notes (Signed)
Pt to CT w/ sitter and security d/t IVC status ?

## 2021-06-25 MED ORDER — LORAZEPAM 1 MG PO TABS
1.0000 mg | ORAL_TABLET | Freq: Once | ORAL | Status: AC
  Administered 2021-06-25: 1 mg via ORAL
  Filled 2021-06-25: qty 1

## 2021-06-25 NOTE — Progress Notes (Signed)
Physical Therapy Treatment ?Patient Details ?Name: Angela CombesRebecca S Nesby ?MRN: [id number] ?DOB: 10/04/1950 ?Today's Date: 06/25/2021 ? ? ?History of Present Illness Pt is a 71 y/o female presenting to ED 06/17/21 with L foot pain and delusion that grocery store is attempting to poison her. PMH includes schizoaffective disorder, depression, alcohol and tobacco use, and recently homeless. ? ?  ?PT Comments  ? ? Pt agreeable to participate, however, does require encouragement to progress. Pt ambulating 80 feet with a walker at a min guard assist level. Able to participate in serial sit to stands for functional strengthening. Pt demonstrates gait abnormalities, impaired balance, and decreased activity tolerance. Will continue to follow acutely to address deficits. ?  ?Recommendations for follow up therapy are one component of a multi-disciplinary discharge planning process, led by the attending physician.  Recommendations may be updated based on patient status, additional functional criteria and insurance authorization. ? ?Follow Up Recommendations ? Other (comment) (Geriatric psych facility) ?  ?  ?Assistance Recommended at Discharge Frequent or constant Supervision/Assistance  ?Patient can return home with the following A little help with walking and/or transfers;A lot of help with bathing/dressing/bathroom;Assistance with cooking/housework;Assistance with feeding;Direct supervision/assist for medications management;Direct supervision/assist for financial management;Assist for transportation;Help with stairs or ramp for entrance ?  ?Equipment Recommendations ? Rolling walker (2 wheels);BSC/3in1  ?  ?Recommendations for Other Services   ? ? ?  ?Precautions / Restrictions Precautions ?Precautions: Fall ?Restrictions ?Weight Bearing Restrictions: No  ?  ? ?Mobility ? Bed Mobility ?Overal bed mobility: Needs Assistance ?Bed Mobility: Supine to Sit, Sit to Supine ?  ?  ?Supine to sit: Supervision ?Sit to supine: Supervision ?   ?General bed mobility comments: increased time/effort, no physical assist required ?  ? ?Transfers ?Overall transfer level: Needs assistance ?Equipment used: Rolling walker (2 wheels), None ?Transfers: Sit to/from Stand ?Sit to Stand: Min guard ?  ?  ?  ?  ?  ?General transfer comment: close min guard assist to rise with and without RW from edge of bed ?  ? ?Ambulation/Gait ?Ambulation/Gait assistance: Min guard ?Gait Distance (Feet): 80 Feet ?Assistive device: Rolling walker (2 wheels) ?Gait Pattern/deviations: Shuffle, Antalgic, Knee flexed in stance - right, Knee flexed in stance - left, Step-through pattern, Decreased stride length ?Gait velocity: decreased ?  ?  ?General Gait Details: Pt with increased left lateral lean, cues for walker proximity, upright posture and obstacle negotiation. Pt frequently running into objects; does not like PT to help her steer walker. Higher cadence with fatigue, needs reminders to control pace for safety ? ? ?Stairs ?  ?  ?  ?  ?  ? ? ?Wheelchair Mobility ?  ? ?Modified Rankin (Stroke Patients Only) ?  ? ? ?  ?Balance Overall balance assessment: Needs assistance ?Sitting-balance support: Single extremity supported, No upper extremity supported, Feet supported, Feet unsupported ?Sitting balance-Leahy Scale: Fair ?Sitting balance - Comments: pt with one posterior LOB sitting edge of bed. able to eat apple sauce with supervision ?  ?Standing balance support: Bilateral upper extremity supported, During functional activity ?Standing balance-Leahy Scale: Poor ?Standing balance comment: use of RW ?  ?  ?  ?  ?  ?  ?  ?  ?  ?  ?  ?  ? ?  ?Cognition Arousal/Alertness: Awake/alert ?Behavior During Therapy: Impulsive, Agitated ?Overall Cognitive Status: History of cognitive impairments - at baseline ?  ?  ?  ?  ?  ?  ?  ?  ?  ?  ?  ?  ?  ?  ?  ?  ?  General Comments: schizoaffective at baseline; per notes, pt not taking medications anymore. ?  ?  ? ?  ?Exercises Other Exercises ?Other  Exercises: x5 sit to stands ? ?  ?General Comments   ?  ?  ? ?Pertinent Vitals/Pain Pain Assessment ?Pain Assessment: Faces ?Faces Pain Scale: No hurt  ? ? ?Home Living   ?  ?  ?  ?  ?  ?  ?  ?  ?  ?   ?  ?Prior Function    ?  ?  ?   ? ?PT Goals (current goals can now be found in the care plan section) Acute Rehab PT Goals ?Patient Stated Goal: none stated ?PT Goal Formulation: Patient unable to participate in goal setting ?Time For Goal Achievement: 07/09/21 ?Potential to Achieve Goals: Fair ?Progress towards PT goals: Progressing toward goals ? ?  ?Frequency ? ? ? Min 2X/week ? ? ? ?  ?PT Plan Current plan remains appropriate  ? ? ?Co-evaluation   ?  ?  ?  ?  ? ?  ?AM-PAC PT "6 Clicks" Mobility   ?Outcome Measure ? Help needed turning from your back to your side while in a flat bed without using bedrails?: None ?Help needed moving from lying on your back to sitting on the side of a flat bed without using bedrails?: A Little ?Help needed moving to and from a bed to a chair (including a wheelchair)?: A Little ?Help needed standing up from a chair using your arms (e.g., wheelchair or bedside chair)?: A Little ?Help needed to walk in hospital room?: A Little ?Help needed climbing 3-5 steps with a railing? : A Lot ?6 Click Score: 18 ? ?  ?End of Session   ?Activity Tolerance: Patient tolerated treatment well ?Patient left: in bed;with call bell/phone within reach ?Nurse Communication: Mobility status ?PT Visit Diagnosis: Unsteadiness on feet (R26.81);Other abnormalities of gait and mobility (R26.89);Muscle weakness (generalized) (M62.81);Difficulty in walking, not elsewhere classified (R26.2) ?  ? ? ?Time: 1610-9604 ?PT Time Calculation (min) (ACUTE ONLY): 17 min ? ?Charges:  $Gait Training: 8-22 mins          ?          ? ?Lillia Pauls, PT, DPT ?Acute Rehabilitation Services ?Pager [phone] ?Office [phone] ? ? ? ?Norval Morton ?06/25/2021, 5:13 PM ? ? ? ?

## 2021-06-25 NOTE — ED Notes (Signed)
Breakfast order placed ?

## 2021-06-25 NOTE — Progress Notes (Signed)
[facility]. [facility] per Ophelia ShoulderShnese Mills, NP. Patient has been faxed out to the following facilities:  ? ? ?[facility]  [address], [place] [address] [phone]  ?[facility]  [address], [place] [address] [phone]  ?[facility]  [address] [place] [address] [phone]  ?[facility]  [address], [place] [address] [phone]  ?[facility]  [address], [place] [address] [phone]  ?[facility]  [address] [place] [address] [phone]  ?[facility]  [address] [place] [address] [phone]  ?[facility]  [address] [place] [address] [phone]  ?[facility]  [address], [place] [address] [phone]  ?[facility]  [address] [place] [address] [phone]  ?[facility]  [address], [place] [address] [phone]  ?[facility]  [address], [place] [address] [phone]  ?[facility]  [address], [place] [address] [phone]  ?[facility]  [address] [place] [address] [phone]  ?[facility]  [address] [place] [address] [phone]  ? ?Damita Dunningseneka Faythe Heitzenrater, MSW, LCSW-A  ?1:23 PM 06/25/2021   ?

## 2021-06-25 NOTE — ED Notes (Signed)
Pt with episode of incontinence on self - brief changed, clean and dry chucks placed under pt, blankets given  ?

## 2021-06-25 NOTE — Evaluation (Signed)
Clinical/Bedside Swallow Evaluation ?Patient Details  ?Name: Angela Medina ?MRN: [id number] ?Date of Birth: September 23, 1950 ? ?Today's Date: 06/25/2021 ?Time: SLP Start Time (ACUTE ONLY): 1415 SLP Stop Time (ACUTE ONLY): 1421 ?SLP Time Calculation (min) (ACUTE ONLY): 6 min ? ?Past Medical History:  ?Past Medical History:  ?Diagnosis Date  ? Aneurysm (HCC)   ? Depression   ? Schizoaffective disorder (HCC)   ? Vision changes   ? right eye  ? ?Past Surgical History:  ?Past Surgical History:  ?Procedure Laterality Date  ? FOOT SURGERY    ? ?HPI:  ?Angela Medina is a 71 y.o. female initially presented to the ED for complaints of foot pain and Delusional (Pt presented to ED with apparent delusion -- reported that she is being poisoned with strychnine through food and wine.). PMH: schizoaffective disorder, homeless, tobacco, ETOH abuse. While drinking water today she was taking large sips and immediately laid down and began coughing and took pills with no problem.  ?  ?Assessment / Plan / Recommendation  ?Clinical Impression ? Pt initially declined swallow assessment stating "it's not a convenient time" then agreeable to small sips thin and graham cracker. Unable to view her oral cavity and she states she has "some teeth". Pt on edge of bed and therapist turned to get cracker. Pt appeared to lose her balance and fell on her back on the matress and pt stated "I lost my balance" with cup of water in hand. There were no signs of aspiration and masticated solid texture timely without residue with limited amount ingested. Recommend when she is eating meals she is in the bed and not on the side of the bed. Continue regular texture, thin liquid. No further ST needed. ?SLP Visit Diagnosis: Dysphagia, unspecified (R13.10) ?   ?Aspiration Risk ? Mild aspiration risk  ?  ?Diet Recommendation Regular;Thin liquid  ? ?Liquid Administration via: Straw;Cup ?Medication Administration: Whole meds with liquid ?Supervision: Patient able to self  feed;Intermittent supervision to cue for compensatory strategies ?Compensations: Minimize environmental distractions;Slow rate;Small sips/bites ?Postural Changes: Seated upright at 90 degrees  ?  ?Other  Recommendations Oral Care Recommendations: Oral care BID   ? ?Recommendations for follow up therapy are one component of a multi-disciplinary discharge planning process, led by the attending physician.  Recommendations may be updated based on patient status, additional functional criteria and insurance authorization. ? ?Follow up Recommendations No SLP follow up  ? ? ?  ?Assistance Recommended at Discharge None  ?Functional Status Assessment    ?Frequency and Duration    ?  ?  ?   ? ?Prognosis    ? ?  ? ?Swallow Study   ?General HPI: Angela Medina is a 71 y.o. female initially presented to the ED for complaints of foot pain and Delusional (Pt presented to ED with apparent delusion -- reported that she is being poisoned with strychnine through food and wine.). PMH: schizoaffective disorder, homeless, tobacco, ETOH abuse. While drinking water today she was taking large sips and immediately laid down and began coughing and took pills with no problem. ?Type of Study: Bedside Swallow Evaluation ?Previous Swallow Assessment:  (no) ?Diet Prior to this Study: Regular;Thin liquids ?Temperature Spikes Noted: No ?Respiratory Status: Room air ?History of Recent Intubation: No ?Behavior/Cognition: Alert;Requires cueing ?Oral Cavity Assessment:  (unable to view. states she has some teeth) ?Oral Care Completed by SLP: No ?Oral Cavity - Dentition:  (states she has some teeth, unable to view due to pt behavior) ?Vision: Functional for self-feeding ?  Self-Feeding Abilities: Able to feed self ?Patient Positioning: Upright in bed ?Baseline Vocal Quality: Normal  ?  ?Oral/Motor/Sensory Function Overall Oral Motor/Sensory Function:  (no overt weakness)   ?Ice Chips Ice chips: Not tested   ?Thin Liquid Thin Liquid: Within functional  limits ?Presentation: Straw  ?  ?Nectar Thick Nectar Thick Liquid: Not tested   ?Honey Thick Honey Thick Liquid: Not tested   ?Puree Puree: Not tested   ?Solid ? ? ?  Solid: Within functional limits  ? ?  ? ?Royce MacadamiaLitaker, Shyteria Lewis Willis ?06/25/2021,3:01 PM ? ? ? ? ?
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
## 2021-06-25 NOTE — ED Notes (Addendum)
Pt needs frequent reminders to slow down when she is drinking water and to remain sitting up until she is finished swallowing. Pt had episode of drinking large amount of water and then immediately laid down - pt began coughing and assisted to sit up in bed - pt able to clear her secretions, talk, and swallow - cough is wet. This RN to monitor pt while taking pill and reminded to take small sips of water - pt took pill with no issues - MD notified - SLP eval already ordered  ?

## 2021-06-25 NOTE — ED Notes (Signed)
Pt continuing to try and get out of bed - pt requesting something else to help her rest - pt has not slept entire night - continues to call out to staff members and gets agitated if she is not answered immediately. Pt continues to try and get up out of bed - and continues to have conversations with herself and with those who are not present - MD messaged and asked for further meds  ?

## 2021-06-26 MED ORDER — ACETAMINOPHEN 325 MG PO TABS
650.0000 mg | ORAL_TABLET | Freq: Four times a day (QID) | ORAL | Status: DC | PRN
  Administered 2021-06-26: 650 mg via ORAL
  Filled 2021-06-26: qty 2

## 2021-06-26 MED ORDER — MELATONIN 3 MG PO TABS
3.0000 mg | ORAL_TABLET | Freq: Every day | ORAL | Status: DC
  Administered 2021-06-26: 3 mg via ORAL
  Filled 2021-06-26: qty 1

## 2021-06-26 NOTE — ED Notes (Signed)
Breakfast Order placed ?

## 2021-06-26 NOTE — ED Notes (Signed)
Pt stated that she needed to go to the restroom. Tech at bedside to assist pt to the bedside commode ?

## 2021-06-26 NOTE — ED Notes (Signed)
Pt's breakfast has arrived, at nurses station due to pt sleeping. ?

## 2021-06-26 NOTE — ED Notes (Signed)
Received verbal report from Joshua N RN at this time 

## 2021-06-26 NOTE — ED Notes (Signed)
Pt requesting something else to help her sleep advised would need to speak with provider reference same. Spoke with provider in green pod advised she would look over the pt chart and order something. Made pt aware of this. Pt is sitting in her room talking to herself at this time ?

## 2021-06-26 NOTE — Progress Notes (Signed)
Patient has been denied by [facility] and [facility] due to acuity. Patient meets [facility] inpatient criteria per Merlyn Lot, NP. Patient has been faxed out to the following facilities:  ? ?[place], [place] [id number] [phone]  ?[facility]  [place], [place] [address] [phone]  ?[facility] [place] [address] [phone]  ?[facility]  [place], [place] [address] [phone]  ?[facility]  [address] [place], [place] [address] [phone]  ?[place] [place] [address] [phone]  ?[facility]  [address], [place] [address] [phone]  ?[place]  [address] [place] [address] [id number]  ?[facility]  [address] [place] [address] [phone]  ?[place]  [address] [place] [address] [phone]  ?[facility]  [address], [place] [address] [phone]  ?[facility]  [address], [place] [address] [phone]  ?[facility]  [place], [place] [address] [phone]  ?[facility]  [address] [place] [address] [phone]  ?[facility]  [address] [place] [address] [phone]  ?Mariea Clonts, MSW, LCSW-A  ?2:24 PM 06/26/2021   ?

## 2021-06-26 NOTE — ED Notes (Signed)
Pt had an incontinent episode. Pt washed up. ?

## 2021-06-26 NOTE — ED Notes (Signed)
Pt's dinner has arrived, pt sat up and eating her dinner ?

## 2021-06-26 NOTE — ED Notes (Signed)
Spoke with [facility] who asked to confirm pt's ability r/t  independence of ADL's  and mobility.  This RN relayed info from notes entered by PT and sitter notes from this morning.  [facility] rep stated they would not be able to except her d/t need for bedside commode and needing help with ADL's.  ?

## 2021-06-26 NOTE — ED Notes (Addendum)
Pt had another incontinent episode. Writer asked pt if there is anything we can do to help her since she was moving around earlier and would call out. Pt simply said "when I twinkle, I can't stop it". Pt made aware that if she needs any help, to call out. Writer is close to the door and has been taking care of pt and engaging with the pt ?

## 2021-06-26 NOTE — ED Notes (Signed)
Pt remains in her room talking to herself at this time ?

## 2021-06-26 NOTE — ED Notes (Signed)
Pt awake and wet the bed. Writer washed pt up and changed her briefs. Pt's breakfast heated up and sitting on the side of the bed while eating ?

## 2021-06-26 NOTE — ED Notes (Signed)
Pt asked for a coffee for her snack and that she needed to 'tinkle'. Writer helped pt go on her bedside commode. Pt moving well with her walker.Pt back in bed ?

## 2021-06-27 MED ORDER — TRAZODONE HCL 50 MG PO TABS
50.0000 mg | ORAL_TABLET | Freq: Every day | ORAL | Status: DC
  Administered 2021-06-27: 50 mg via ORAL
  Filled 2021-06-27: qty 1

## 2021-06-27 MED ORDER — OLANZAPINE 5 MG PO TABS: 5.0000 mg | ORAL_TABLET | Freq: Every day | ORAL | 0 refills | Status: DC

## 2021-06-27 MED ORDER — ZOLPIDEM TARTRATE 5 MG PO TABS: 5.0000 mg | ORAL_TABLET | Freq: Every evening | ORAL | 0 refills | Status: DC | PRN

## 2021-06-27 NOTE — Social Work (Signed)
Pt is medically cleared and psych cleared. [facility] has added OP mental health resources as well as homelessness resources to [facility]. ?CSW met with Pt at bedside. Pt states that she has $95 but does not have ID to go to motel. Pt did give address for d/c.  ?CSW will provide clothing from clothes closet as well as a jacket and hat. Pt is familiar with [facility] and other homeless resources locally.  ?EDP updated. ?

## 2021-06-27 NOTE — ED Notes (Signed)
Pt resting comfortably at this time. Denies complaints.  

## 2021-06-27 NOTE — ED Notes (Signed)
Pt assisted to [facility] by this RN and sitter. Pt in bed eating breakfast at this time.  ?

## 2021-06-27 NOTE — ED Provider Notes (Signed)
Patient cleared by psychiatry -she was already medically cleared. ? ?Social work also has cleared the patient.  She is unfortunately homeless, not amenable to home health. ?Reviewed patient's physical therapy notes.  She is able to ambulate, but does have some balance issues. ? ?Talked with patient.  She said she never wanted to be in the ER in first place.  She is requesting that we give her some Zyprexa and Ambien, and prescriptions that she has run out of. ? ?She is currently homeless.  She is comfortable with that.  Outpatient resources have been provided.  Patient comfortable with going home. ?  ?Derwood Kaplan, MD ?06/27/21 2243 ? ?

## 2021-06-27 NOTE — ED Notes (Signed)
Pt laying in bed talking to herself. Pt appears in NAD, denies complaints.  ?

## 2021-06-27 NOTE — Progress Notes (Signed)
Pt has been psych cleared per Ophelia ShoulderShnese Mills, NP. This CS[facility]. TOC will now assist and follow pt with discharge needs.  ? ? ?Maryjean Kahelsea N. Maleki Hippe, MSW, LCSWA ?06/27/2021 7:59 PM ? ? ?

## 2021-06-27 NOTE — ED Notes (Signed)
PT at bedside.

## 2021-06-27 NOTE — ED Notes (Signed)
Pt is talking to herself with a loud voice and using profanity and calling for the nurse at this time ?

## 2021-06-27 NOTE — ED Notes (Signed)
Pt reports anxiety, appears agitated.  ?

## 2021-06-27 NOTE — ED Notes (Signed)
Pt in room, yelling out for nurse. Pt stated she wanted to get situated and was requesting help doing so. RN and sitter assisted pt, giving pt walker reminding pt that she is able to stand. Pt started yelling at this RN and sitter, yelling racial slurs. Pt resting comfortably in bed at this time.  ?

## 2021-06-27 NOTE — ED Notes (Signed)
Pt in room talking about that she came in voluntary and she is not sure why she is involuntary and she wanted to speak with someone in reference to going to somewhere else. Pt advised that she is under IVC and is unable to leave. Pt then starts requesting a Clinical research associate. Advised pt that she would need to talk to her provider in reference to same that all I could do was put a note in the computer reference same. ?

## 2021-06-27 NOTE — ED Notes (Signed)
Breakfast order placed ?
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
## 2021-06-27 NOTE — ED Notes (Signed)
SW at bedside speaking with patient regarding discharge plan ? ?

## 2021-06-27 NOTE — ED Notes (Signed)
IVC rescind has been signed by EDP and faxed by [facility]  ?

## 2021-06-27 NOTE — ED Notes (Signed)
ED Provider at bedside. 

## 2021-06-27 NOTE — Progress Notes (Signed)
Pt has been denied at [facility]. ? ?Maryjean Ka, MSW, LCSWA ?06/27/2021 12:05 AM ? ? ?

## 2021-06-27 NOTE — ED Notes (Signed)
[facility]  ?

## 2021-06-27 NOTE — ED Notes (Signed)
Pt is now calling out in her room for the nurse. Advised I would be there shortly and she continues to call out for the nurse. Pt is asking for hot chocolate, hot tea, or hot coffee. Pt advised she could have a cup of water and a fresh warm blanket. Pt continues to yell and use profanity with staff after being asked multiple times to quiet raising her voice and using profanity that there are other people in this department trying to sleep. Pt advised if she continued yelling and cussing that the door would be shut.  ?

## 2021-06-27 NOTE — Progress Notes (Signed)
Patient has been denied by [facility] due to no beds available. Patient meets [facility] inpatient criteria per Merlyn Lot, NP. Patient has been faxed out to the following facilities:  ? ?[place], [place] [phone]  ?[facility]  [place], [place] [address] [phone]  ?[facility] [address] [place] [address] [phone]  ?[place]  [place], [place] [address] [phone]  ?[facility]  [address] [place], [place] [address] [phone]  ?[place] [address] [place] [address] [phone]  ?[facility]  [address] [place] [address] [phone]  ?[place]  [address] [place] [address] [phone]  ?[facility]  [address] [place] [address] [phone]  ?[place]  [place] [place] [address] [phone]  ?[facility]  [address], [place] [address] [phone]  ?[facility]  [address], [place] [address] [phone]  ?[facility]  [place], [place] [address] [phone]  ?[facility]  [address] [place] [address] [phone]  ?[facility]  [address], [place] [address] [phone]  ? ?Mariea Clonts, MSW, LCSW-A  ?3:26 PM 06/27/2021   ?

## 2021-06-28 ENCOUNTER — Emergency Department (HOSPITAL_COMMUNITY)
Admission: EM | Admit: 2021-06-28 | Discharge: 2021-06-28 | Disposition: A | Discharge status: Home / Self Care | Payer: Medicare Other | Source: Home / Self Care | Attending: Emergency Medicine | Admitting: Emergency Medicine

## 2021-06-28 ENCOUNTER — Encounter (HOSPITAL_COMMUNITY): Payer: Self-pay | Admitting: Emergency Medicine

## 2021-06-28 ENCOUNTER — Emergency Department (HOSPITAL_COMMUNITY): Payer: Medicare Other

## 2021-06-28 ENCOUNTER — Encounter (HOSPITAL_COMMUNITY): Payer: Self-pay | Admitting: *Deleted

## 2021-06-28 ENCOUNTER — Emergency Department (HOSPITAL_COMMUNITY)
Admission: EM | Admit: 2021-06-28 | Discharge: 2021-06-28 | Disposition: A | Discharge status: Home / Self Care | Payer: Medicare Other | Attending: Emergency Medicine | Admitting: Emergency Medicine

## 2021-06-28 ENCOUNTER — Other Ambulatory Visit: Payer: Self-pay

## 2021-06-28 ENCOUNTER — Encounter (HOSPITAL_COMMUNITY): Payer: Self-pay

## 2021-06-28 DIAGNOSIS — S022XXA Fracture of nasal bones, initial encounter for closed fracture: Secondary | ICD-10-CM | POA: Insufficient documentation

## 2021-06-28 DIAGNOSIS — Z5900 Homelessness unspecified: Secondary | ICD-10-CM | POA: Insufficient documentation

## 2021-06-28 DIAGNOSIS — Y900 Blood alcohol level of less than 20 mg/100 ml: Secondary | ICD-10-CM | POA: Insufficient documentation

## 2021-06-28 DIAGNOSIS — W01198A Fall on same level from slipping, tripping and stumbling with subsequent striking against other object, initial encounter: Secondary | ICD-10-CM | POA: Insufficient documentation

## 2021-06-28 DIAGNOSIS — M25552 Pain in left hip: Secondary | ICD-10-CM | POA: Insufficient documentation

## 2021-06-28 DIAGNOSIS — M79641 Pain in right hand: Secondary | ICD-10-CM | POA: Insufficient documentation

## 2021-06-28 DIAGNOSIS — F259 Schizoaffective disorder, unspecified: Secondary | ICD-10-CM | POA: Diagnosis not present

## 2021-06-28 DIAGNOSIS — R202 Paresthesia of skin: Secondary | ICD-10-CM | POA: Diagnosis not present

## 2021-06-28 DIAGNOSIS — W19XXXA Unspecified fall, initial encounter: Secondary | ICD-10-CM | POA: Insufficient documentation

## 2021-06-28 DIAGNOSIS — M6281 Muscle weakness (generalized): Secondary | ICD-10-CM | POA: Insufficient documentation

## 2021-06-28 DIAGNOSIS — F1010 Alcohol abuse, uncomplicated: Secondary | ICD-10-CM | POA: Insufficient documentation

## 2021-06-28 DIAGNOSIS — R531 Weakness: Secondary | ICD-10-CM | POA: Insufficient documentation

## 2021-06-28 DIAGNOSIS — S0033XA Contusion of nose, initial encounter: Secondary | ICD-10-CM | POA: Diagnosis not present

## 2021-06-28 DIAGNOSIS — M79642 Pain in left hand: Secondary | ICD-10-CM | POA: Insufficient documentation

## 2021-06-28 DIAGNOSIS — M25559 Pain in unspecified hip: Secondary | ICD-10-CM

## 2021-06-28 LAB — CBC WITH DIFFERENTIAL/PLATELET
Abs Immature Granulocytes: 0.04 10*3/uL (ref 0.00–0.07)
Basophils Absolute: 0.1 10*3/uL (ref 0.0–0.1)
Basophils Relative: 1 %
Eosinophils Absolute: 0.1 10*3/uL (ref 0.0–0.5)
Eosinophils Relative: 1 %
HCT: 46.1 % — ABNORMAL HIGH (ref 36.0–46.0)
Hemoglobin: 14.8 g/dL (ref 12.0–15.0)
Immature Granulocytes: 1 %
Lymphocytes Relative: 20 %
Lymphs Abs: 1.7 10*3/uL (ref 0.7–4.0)
MCH: 31.7 pg (ref 26.0–34.0)
MCHC: 32.1 g/dL (ref 30.0–36.0)
MCV: 98.7 fL (ref 80.0–100.0)
Monocytes Absolute: 1.2 10*3/uL — ABNORMAL HIGH (ref 0.1–1.0)
Monocytes Relative: 13 %
Neutro Abs: 5.6 10*3/uL (ref 1.7–7.7)
Neutrophils Relative %: 64 %
Platelets: 320 10*3/uL (ref 150–400)
RBC: 4.67 MIL/uL (ref 3.87–5.11)
RDW: 13.1 % (ref 11.5–15.5)
WBC: 8.7 10*3/uL (ref 4.0–10.5)
nRBC: 0 % (ref 0.0–0.2)

## 2021-06-28 LAB — COMPREHENSIVE METABOLIC PANEL
ALT: 12 U/L (ref 0–44)
AST: 24 U/L (ref 15–41)
Albumin: 3.4 g/dL — ABNORMAL LOW (ref 3.5–5.0)
Alkaline Phosphatase: 97 U/L (ref 38–126)
Anion gap: 13 (ref 5–15)
BUN: 18 mg/dL (ref 8–23)
CO2: 25 mmol/L (ref 22–32)
Calcium: 8.8 mg/dL — ABNORMAL LOW (ref 8.9–10.3)
Chloride: 101 mmol/L (ref 98–111)
Creatinine, Ser: 0.43 mg/dL — ABNORMAL LOW (ref 0.44–1.00)
GFR, Estimated: >60 mL/min (ref >60)
Glucose, Bld: 87 mg/dL (ref 70–99)
Potassium: 3.9 mmol/L (ref 3.5–5.1)
Sodium: 139 mmol/L (ref 135–145)
Total Bilirubin: 0.6 mg/dL (ref 0.3–1.2)
Total Protein: 6.9 g/dL (ref 6.5–8.1)

## 2021-06-28 LAB — MAGNESIUM: Magnesium: 2.1 mg/dL (ref 1.7–2.4)

## 2021-06-28 LAB — CK: Total CK: 82 U/L (ref 38–234)

## 2021-06-28 LAB — ETHANOL: Alcohol, Ethyl (B): <10 mg/dL (ref <10)

## 2021-06-28 MED ORDER — SODIUM CHLORIDE 0.9 % IV BOLUS
500.0000 mL | Freq: Once | INTRAVENOUS | Status: AC
  Administered 2021-06-28: 09:00:00 500 mL via INTRAVENOUS

## 2021-06-28 MED ORDER — IBUPROFEN 400 MG PO TABS
600.0000 mg | ORAL_TABLET | Freq: Once | ORAL | Status: DC
  Filled 2021-06-28: qty 1

## 2021-06-28 NOTE — Discharge Instructions (Addendum)
Follow up outpatient ?

## 2021-06-28 NOTE — ED Notes (Signed)
Ambulated pt with walker. Pt had a slow gait and semi steady gait. Pt states she is worried about falling agin. Pt brought back to bed. ?

## 2021-06-28 NOTE — ED Triage Notes (Signed)
BIB EMS after just being [facility] due to [facility]. C/o left hip and nose pain.  ?

## 2021-06-28 NOTE — ED Notes (Signed)
Pt offered bath and fresh pain of clothes. Pt denied saying she liked the clothes she was wearing better. New socks given. Sandwich and beverage provided.  ?

## 2021-06-28 NOTE — ED Triage Notes (Signed)
Arrives EMS from the streets after falling down. C/o left hip pain.  ?

## 2021-06-28 NOTE — ED Provider Notes (Signed)
[facility]-EMERGENCY DEPT Provider Note   CSN: [id number] Arrival date & time: 06/28/21  1540     History  Chief Complaint  Patient presents with   Marletta Lor    Angela Medina is a 71 y.o. female history of homelessness, EtOH use, schizophrenia here for evaluation of left hip pain.  Discharge from [facility] approximately 30 minutes PTA, also a few hours before that this morning.  States she still has left hip pain.  Had x-ray as well as CT head, cervical and labs obtained at that time.  Seen here in ED and dc 06/17/21-06/27/21.Was seen by social work multiple times as well as psychiatry.  She was cleared by psychiatry as well as medically.  Unfortunately does not meet criteria for placement psych or SNF.  Also did not qualify for home health due to homelessness.  Patient denies any new numbness or weakness.  Has chronic paresthesias to LE for years. Per previous provider note from less than 1 hour ago as well as nursing staff patient was ambulatory with walker which she uses at baseline.  She denies any recent falls since being discharged 30 minutes ago.  Patient brought in by EMS.   Denies HA, neck pain, CP, SOB, ABD pain, unilateral weakness, edema, numbness, back pain, bowel or bladder incontinence, saddle paresthesias, SI, HI, AVH.  States she wants someplace to sleep and take care of her.  HPI     Home Medications Prior to Admission medications   Medication Sig Start Date End Date Taking? Authorizing Provider  albuterol (VENTOLIN HFA) 108 (90 Base) MCG/ACT inhaler Inhale 2 puffs into the lungs every 6 (six) hours as needed for wheezing or shortness of breath. Patient not taking: Reported on 06/20/2021 04/02/21   Elige Radon, MD  doxycycline (VIBRAMYCIN) 100 MG capsule Take 1 capsule (100 mg total) by mouth 2 (two) times daily. Patient not taking: Reported on 06/20/2021 05/02/21   Caccavale, Sophia, PA-C  OLANZapine (ZYPREXA) 5 MG tablet Take 1 tablet (5 mg total)  by mouth at bedtime. 06/27/21   Derwood Kaplan, MD  zolpidem (AMBIEN) 5 MG tablet Take 1 tablet (5 mg total) by mouth at bedtime as needed for sleep. 06/27/21   Derwood Kaplan, MD      Allergies    Penicillins    Review of Systems   Review of Systems  Constitutional: Negative.   HENT: Negative.         Nasal bridge pain  Respiratory: Negative.    Cardiovascular: Negative.   Genitourinary: Negative.   Musculoskeletal:  Negative for neck pain and neck stiffness.       Left hip pain  Skin: Negative.   Neurological: Negative.   All other systems reviewed and are negative.  Physical Exam Updated Vital Signs BP 122/83 (BP Location: Left Arm)    Pulse 90    Temp 98.5 F (36.9 C) (Oral)    Resp 18    Ht  (1.549 m)    Wt 63.5 kg    SpO2 97%    BMI 26.45 kg/m  Physical Exam Vitals and nursing note reviewed.  Constitutional:      General: She is not in acute distress.    Appearance: She is well-developed. She is not ill-appearing.     Comments: Disheveled  HENT:     Head: Normocephalic and atraumatic.     Comments: Abrasion to bridge of nose, no epistaxis, septal hematoma    Nose: No congestion or rhinorrhea.  Mouth/Throat:     Mouth: Mucous membranes are moist.     Comments: No drooling, dysphagia, trismus, loose dentition Eyes:     Pupils: Pupils are equal, round, and reactive to light.  Neck:     Comments: No line tenderness, full range of motion without difficulty Cardiovascular:     Rate and Rhythm: Normal rate.     Pulses: Normal pulses.          Radial pulses are 2+ on the right side and 2+ on the left side.     Heart sounds: Normal heart sounds.  Pulmonary:     Effort: Pulmonary effort is normal. No respiratory distress.     Breath sounds: Normal breath sounds.     Comments: Clear Bilaterally, speaks in full sentences without difficulty Abdominal:     General: Bowel sounds are normal. There is no distension.     Palpations: Abdomen is soft.     Comments:  Soft, nontender, no rebound or guarding  Musculoskeletal:        General: Tenderness present. Normal range of motion.     Cervical back: Normal range of motion.     Comments: Diffuse tenderness to left hip, no shortening or rotation of legs.  No midline C/T/L tenderness.  No bony tenderness to femur, knee, tib-fib bilaterally.  Wiggles toes without difficulty.  No overlying edema, erythema or warmth to left hip  Skin:    General: Skin is warm and dry.     Comments: No lacerations to suture  Neurological:     General: No focal deficit present.     Mental Status: She is alert and oriented to person, place, and time.     Comments: Cranial nerves II through XII grossly intact Equal grip Ambulatory with walker Intact sensation  Psychiatric:        Mood and Affect: Mood normal.    ED Results / Procedures / Treatments   Labs (all labs ordered are listed, but only abnormal results are displayed) Labs Reviewed - No data to display  EKG None  Radiology DG Nasal Bones  Result Date: 06/28/2021 CLINICAL DATA:  Fall.  Nose injury EXAM: NASAL BONES - 3+ VIEW COMPARISON:  CT head 06/24/2021 FINDINGS: Fracture of the tip of the nasal bone without significant displacement. No other fracture identified Embolization coils in the region of the terminal right internal carotid artery. IMPRESSION: Nondisplaced fracture tip of the nasal bone. Electronically Signed   By: Marlan Palauharles  Clark M.D.   On: 06/28/2021 07:54   CT Head Wo Contrast  Result Date: 06/28/2021 CLINICAL DATA:  Head trauma, minor (Age >= 65y); Neck trauma (Age >= 65y) EXAM: CT HEAD WITHOUT CONTRAST CT CERVICAL SPINE WITHOUT CONTRAST TECHNIQUE: Multidetector CT imaging of the head and cervical spine was performed following the standard protocol without intravenous contrast. Multiplanar CT image reconstructions of the cervical spine were also generated. RADIATION DOSE REDUCTION: This exam was performed according to the departmental  dose-optimization program which includes automated exposure control, adjustment of the mA and/or kV according to patient size and/or use of iterative reconstruction technique. COMPARISON:  04/02/2021. FINDINGS: CT HEAD FINDINGS Brain: No evidence of acute large vascular territory infarction, hemorrhage, hydrocephalus, extra-axial collection or mass lesion/mass effect. Moderate patchy white matter hypoattenuation, nonspecific but with chronic microvascular ischemic disease. Vascular: Redemonstrated coil embolization in the expected region the right ICA. Skull: No acute fracture. Sinuses/Orbits: Clear sinuses.  Unremarkable orbits. Other: No mastoid effusions CT CERVICAL SPINE FINDINGS Alignment: Similar 5 mm of  anterolisthesis of C3 on C4 and 4 mm of anterolisthesis of C4 on C5. Similar mild lower anterolisthesis of C7 on T1 and retrolisthesis of C6 on C7. Skull base and vertebrae: Vertebral body heights are unchanged. No evidence of acute fracture. Soft tissues and spinal canal: No prevertebral fluid or swelling. No visible canal hematoma. Disc levels: Severe multilevel degenerative disc disease and facet arthropathy. Facet arthropathy is greatest and severe at C3-C4 and C4-C5, similar. Resulting varying degrees of neural foraminal stenosis. Upper chest: Visualized lung apices are clear. Other: Calcific atherosclerosis. IMPRESSION: CT head: 1. No evidence of acute intracranial abnormality. 2. Moderate chronic microvascular ischemic disease. 3. Redemonstrated coil embolization in the expected region of the right ICA. CT cervical spine: 1. No evidence of acute fracture or traumatic malalignment. 2. Severe multilevel degenerative disease and facet arthropathy with similar degenerative anterolisthesis of C3 on C4 and C4 on C5, detailed above. Electronically Signed   By: Feliberto Harts M.D.   On: 06/28/2021 09:04   CT Cervical Spine Wo Contrast  Result Date: 06/28/2021 CLINICAL DATA:  Head trauma, minor (Age >=  65y); Neck trauma (Age >= 65y) EXAM: CT HEAD WITHOUT CONTRAST CT CERVICAL SPINE WITHOUT CONTRAST TECHNIQUE: Multidetector CT imaging of the head and cervical spine was performed following the standard protocol without intravenous contrast. Multiplanar CT image reconstructions of the cervical spine were also generated. RADIATION DOSE REDUCTION: This exam was performed according to the departmental dose-optimization program which includes automated exposure control, adjustment of the mA and/or kV according to patient size and/or use of iterative reconstruction technique. COMPARISON:  04/02/2021. FINDINGS: CT HEAD FINDINGS Brain: No evidence of acute large vascular territory infarction, hemorrhage, hydrocephalus, extra-axial collection or mass lesion/mass effect. Moderate patchy white matter hypoattenuation, nonspecific but with chronic microvascular ischemic disease. Vascular: Redemonstrated coil embolization in the expected region the right ICA. Skull: No acute fracture. Sinuses/Orbits: Clear sinuses.  Unremarkable orbits. Other: No mastoid effusions CT CERVICAL SPINE FINDINGS Alignment: Similar 5 mm of anterolisthesis of C3 on C4 and 4 mm of anterolisthesis of C4 on C5. Similar mild lower anterolisthesis of C7 on T1 and retrolisthesis of C6 on C7. Skull base and vertebrae: Vertebral body heights are unchanged. No evidence of acute fracture. Soft tissues and spinal canal: No prevertebral fluid or swelling. No visible canal hematoma. Disc levels: Severe multilevel degenerative disc disease and facet arthropathy. Facet arthropathy is greatest and severe at C3-C4 and C4-C5, similar. Resulting varying degrees of neural foraminal stenosis. Upper chest: Visualized lung apices are clear. Other: Calcific atherosclerosis. IMPRESSION: CT head: 1. No evidence of acute intracranial abnormality. 2. Moderate chronic microvascular ischemic disease. 3. Redemonstrated coil embolization in the expected region of the right ICA. CT  cervical spine: 1. No evidence of acute fracture or traumatic malalignment. 2. Severe multilevel degenerative disease and facet arthropathy with similar degenerative anterolisthesis of C3 on C4 and C4 on C5, detailed above. Electronically Signed   By: Feliberto Harts M.D.   On: 06/28/2021 09:04   CT Hip Left Wo Contrast  Result Date: 06/28/2021 CLINICAL DATA:  Hip pain, stress fracture suspected. Negative x-ray. Patient complains of left hip pain. EXAM: CT OF THE LEFT HIP WITHOUT CONTRAST TECHNIQUE: Multidetector CT imaging of the left hip was performed according to the standard protocol. Multiplanar CT image reconstructions were also generated. RADIATION DOSE REDUCTION: This exam was performed according to the departmental dose-optimization program which includes automated exposure control, adjustment of the mA and/or kV according to patient size and/or use of iterative  reconstruction technique. COMPARISON:  None. FINDINGS: Bones/Joint/Cartilage No fracture or dislocation. Normal alignment. No joint effusion. Mild superolateral joint space narrowing. Ligaments Ligaments are suboptimally evaluated by CT. Muscles and Tendons Muscles are normal in bulk in density. No intramuscular hematoma or fluid collection. Soft tissue No fluid collection or hematoma. No soft tissue mass. Mild subcutaneous soft tissue edema and skin thickening in the gluteal region. Scattered colonic diverticula without evidence of acute diverticulitis. IMPRESSION: 1. No evidence of fracture or dislocation. 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 2.  Muscles and tendons are intact. 3. Mild skin thickening and subcutaneous edema in the gluteal region, which may represent mild cellulitis. Clinical correlation is suggested. Electronically Signed   By: Larose Hires D.O.   On: 06/28/2021 16:22   DG Hip Unilat W or Wo Pelvis 2-3 Views Left  Result Date: 06/28/2021 CLINICAL DATA:  71 year old female with history of trauma from a fall. Left-sided hip  pain. EXAM: DG HIP (WITH OR WITHOUT PELVIS) 2-3V LEFT COMPARISON:  No priors. FINDINGS: Three nonstandard views of the bony pelvis in the left hip are submitted for evaluation. On these views, the bony pelvis appears intact, as does the visualized right proximal femur. Left femur appears internally rotated compared with a normal position, which limits evaluation of the femoral neck. With these limitations in mind, no definite acute displaced left femoral neck fracture is confidently identified. The femoral head is properly located. There is joint space narrowing, subchondral sclerosis and osteophyte formation in both hip joints, indicative of osteoarthritis. IMPRESSION: 1. Limited study demonstrating no definite acute radiographic abnormality of the left hip. If there is strong clinical concern for left hip fracture, further evaluation with noncontrast CT of the pelvis could be obtained. 2. Moderate bilateral hip joint osteoarthritis. Electronically Signed   By: Trudie Reed M.D.   On: 06/28/2021 06:15    Procedures Procedures    Medications Ordered in ED Medications - No data to display  ED Course/ Medical Decision Making/ A&P    71 year old here for her third visit in less than 12 hours here for evaluation of requesting reassessment.  Third visit for mechanical fall which occurred last night.  Had CT head, cervical, x-ray left hip as well as labs during her prior visits.  She was ambulatory with her walker which she uses at baseline.  Discharged 06/27/21 after being clear by psych and PT/OT after an ED boarding stay from 2/26-3/8. Did not meet placement criteria for SNF and psychiatry cleared patient.  Could not get home health due to homelessness.  Patient comes here for reevaluation 30 minutes after being discharged from [facility] for similar complaints.  She is neurovascularly intact.  She denies any additional falls since her prior hospital work-up.  Her heart and lungs are clear.   Abdomen soft, nontender.  She is neurovascularly intact.  Does have some tenderness to her lateral left hip however no overlying skin changes. Low suspicion for infectious process, VTE, ischemia, myositis, septic joint, hemarthrosis. Patient here requesting "someone to take care of her and a place to sleep." Given resources for outpatient follow-up during prior visit.  Will obtain CT scan to assess for occult fracture.  She denies any SI, HI, AVH. Does not appear to have active psychosis at this time.  Imaging personally viewed and interpreted:  CT scan without acute fracture, dislocation, tendons and ligaments intact. Does have mild skin thickening correlate on exam for mild cellulitis.  Exam not consistent with cellulitis.  Patient able to stand and  ambulate with walker. NV intact.  Patient does not have reason for admission to the hospital at this time. Do not feel need to get SW involved  given ambulatory and recently given resources. When discussing results of her imaging patient became verbally aggressive.  Required security at bedside. Discharged in stable condition.  The patient has been appropriately medically screened and/or stabilized in the ED. I have low suspicion for any other emergent medical condition which would require further screening, evaluation or treatment in the ED or require inpatient management.  Patient is hemodynamically stable and in no acute distress.  Patient able to ambulate in department prior to ED.  Evaluation does not show acute pathology that would require ongoing or additional emergent interventions while in the emergency department or further inpatient treatment.  I have discussed the diagnosis with the patient and answered all questions.  Pain is been managed while in the emergency department and patient has no further complaints prior to discharge.  Patient is comfortable with plan discussed in room and is stable for discharge at this time.  I have discussed  strict return precautions for returning to the emergency department.  Patient was encouraged to follow-up with PCP/specialist refer to at discharge.                           Medical Decision Making Amount and/or Complexity of Data Reviewed Independent Historian: EMS External Data Reviewed: labs, radiology, ECG and notes. Radiology: ordered and independent interpretation performed. Decision-making details documented in ED Course.  Risk OTC drugs.         Final Clinical Impression(s) / ED Diagnoses Final diagnoses:  Hip pain    Rx / DC Orders ED Discharge Orders     None         Connie Hilgert A, PA-C 06/28/21 1640    Mancel Bale, MD 06/28/21 2313

## 2021-06-28 NOTE — ED Notes (Addendum)
Pt declined ice pack.  ? ?Pt was able to ambulate with assistance. Pt stated she felt shaky. She was able to take 10 steps using the walker while the NT and RN placed their hand on her back as guided support. Pt steps were slow and and steady. Pt gripped the walker with both hands. Pt stated she was ready to sit back down. Pt was able to turn around with walker and slowly walk back to stretcher. Pt states she is nervous of falling.  ? ?RN educated pt to continue to use walker and maintain a good grip. Pt should break when necessary. ? ?While ambulating pt stated that she wants to sign papers that if she become disable she just want to be put to sleep. Pt doesn't want to be a "paraplegic." ?

## 2021-06-28 NOTE — ED Notes (Signed)
Patient transported to CT 

## 2021-06-28 NOTE — ED Notes (Signed)
Pt is currently laying in bed resting.  ?

## 2021-06-28 NOTE — ED Notes (Signed)
Pt refuses ice pack. 

## 2021-06-28 NOTE — ED Provider Triage Note (Signed)
Emergency Medicine Provider Triage Evaluation Note ? ?Angela Medina , a 71 y.o. female  was evaluated in triage.  Pt complains of left hip pain.  Patient seen multiple times last few days, most recently this is her third visit in less than 12 hours.  Apparently homeless, history of EtOH use.  Recently discharged 30 minutes ago from [facility].  States she still has some left hip pain.  She denies any recurrent fall.  Apparently when seen on 2/26 patient was requesting placement.  Social work is seen patient multiple times.  She was cleared by psychiatry as well.  Had some balance issues at baseline.  Patient did not meet SNF placement at that time and due to homelessness not a candidate for home ? ?Patient states hip pain worse since discharge 30 minutes ago.  Brought in by EMS here.  Denies any numbness or weakness.  No additional falls since her prior ED visit 30 minutes ago ? ?Per prior ED visit patient was able to ambulate with walker however slow. ? ?Review of Systems  ?Positive: Left hip pain ?Negative: Fever, emesis, shortness of breath ? ?Physical Exam  ?BP 122/83 (BP Location: Left Arm)   Pulse 65   Resp 18   Ht 5\' 1"  (1.549 m)   Wt 63.5 kg   SpO2 97%   BMI 26.45 kg/m?  ?Gen:   Awake, no distress   ?Resp:  Normal effort  ?MSK:   Moves extremities without difficulty, diffuse tenderness to left hip ?Other:   ? ?Medical Decision Making  ?Medically screening exam initiated at 3:48 PM.  Appropriate orders placed.  Angela Medina was informed that the remainder of the evaluation will be completed by another provider, this initial triage assessment does not replace that evaluation, and the importance of remaining in the ED until their evaluation is complete. ? ?Left hip pain.  Reviewed recent x-ray.  Will get CT scan to rule out occult fracture ?  ?Mazen Marcin A, PA-C ?06/28/21 1551 ? ?

## 2021-06-28 NOTE — ED Provider Notes (Signed)
[facility] EMERGENCY DEPARTMENT Provider Note   CSN: [id number] Arrival date & time: 06/28/21  [id number]     History  Chief Complaint  Patient presents with   [name]    Angela Medina is a 71 y.o. female.  HPI 71 year old female with a history of schizophrenia, homelessness, alcohol and tobacco abuse presents with a fall and nose injury.  She was seen here last night/this morning for a fall and hip pain and was waiting on a cab and was using her walker when somehow she lost her balance and hit her nose on the walker itself.  She did not strike her head or lose consciousness.  She states there was a little bit of bleeding from her nose and she is concerned her nose might be broken.  She also tells me that both of her hands have been cramping over the last 24 hours making it harder for her to grip the walker.  Home Medications Prior to Admission medications   Medication Sig Start Date End Date Taking? Authorizing Provider  albuterol (VENTOLIN HFA) 108 (90 Base) MCG/ACT inhaler Inhale 2 puffs into the lungs every 6 (six) hours as needed for wheezing or shortness of breath. Patient not taking: Reported on 06/20/2021 04/02/21   Mitzi Hansen, MD  doxycycline (VIBRAMYCIN) 100 MG capsule Take 1 capsule (100 mg total) by mouth 2 (two) times daily. Patient not taking: Reported on 06/20/2021 05/02/21   Caccavale, Sophia, PA-C  OLANZapine (ZYPREXA) 5 MG tablet Take 1 tablet (5 mg total) by mouth at bedtime. 06/27/21   Varney Biles, MD  zolpidem (AMBIEN) 5 MG tablet Take 1 tablet (5 mg total) by mouth at bedtime as needed for sleep. 06/27/21   Varney Biles, MD      Allergies    Penicillins    Review of Systems   Review of Systems  HENT:  Positive for facial swelling and nosebleeds.   Neurological:  Negative for headaches.   Physical Exam Updated Vital Signs BP (!) 161/91 (BP Location: Right Arm)    Pulse 75    Temp 98.6 F (37 C)    Resp 14    SpO2 100%  Physical  Exam Vitals and nursing note reviewed.  Constitutional:      Appearance: She is well-developed.  HENT:     Head: Normocephalic.     Comments: No tenderness over the maxilla or mandible.    Nose: Nasal tenderness present. No nasal deformity or septal deviation.     Right Nostril: No septal hematoma or occlusion.     Left Nostril: No septal hematoma or occlusion.     Comments: No active bleeding to her nose though there is a small abrasion to the tip of her nose and some dried blood in her in her right nare.  Distal tenderness. Cardiovascular:     Rate and Rhythm: Normal rate and regular rhythm.     Pulses:          Radial pulses are 2+ on the right side and 2+ on the left side.     Heart sounds: Normal heart sounds.  Pulmonary:     Effort: Pulmonary effort is normal.  Abdominal:     General: There is no distension.  Musculoskeletal:     Comments: Bilateral hands have no focal swelling or tenderness. Grossly normal sensation. Mildly weak grip strength but is symmetric.   Skin:    General: Skin is warm and dry.  Neurological:     Mental  Status: She is alert.    ED Results / Procedures / Treatments   Labs (all labs ordered are listed, but only abnormal results are displayed) Labs Reviewed  COMPREHENSIVE METABOLIC PANEL - Abnormal; Notable for the following components:      Result Value   Creatinine, Ser 0.43 (*)    Calcium 8.8 (*)    Albumin 3.4 (*)    All other components within normal limits  CBC WITH DIFFERENTIAL/PLATELET - Abnormal; Notable for the following components:   HCT 46.1 (*)    Monocytes Absolute 1.2 (*)    All other components within normal limits  ETHANOL  MAGNESIUM  CK    EKG None  Radiology DG Nasal Bones  Result Date: 06/28/2021 CLINICAL DATA:  Fall.  Nose injury EXAM: NASAL BONES - 3+ VIEW COMPARISON:  CT head 06/24/2021 FINDINGS: Fracture of the tip of the nasal bone without significant displacement. No other fracture identified Embolization coils  in the region of the terminal right internal carotid artery. IMPRESSION: Nondisplaced fracture tip of the nasal bone. Electronically Signed   By: Franchot Gallo M.D.   On: 06/28/2021 07:54   CT Head Wo Contrast  Result Date: 06/28/2021 CLINICAL DATA:  Head trauma, minor (Age >= 65y); Neck trauma (Age >= 65y) EXAM: CT HEAD WITHOUT CONTRAST CT CERVICAL SPINE WITHOUT CONTRAST TECHNIQUE: Multidetector CT imaging of the head and cervical spine was performed following the standard protocol without intravenous contrast. Multiplanar CT image reconstructions of the cervical spine were also generated. RADIATION DOSE REDUCTION: This exam was performed according to the departmental dose-optimization program which includes automated exposure control, adjustment of the mA and/or kV according to patient size and/or use of iterative reconstruction technique. COMPARISON:  04/02/2021. FINDINGS: CT HEAD FINDINGS Brain: No evidence of acute large vascular territory infarction, hemorrhage, hydrocephalus, extra-axial collection or mass lesion/mass effect. Moderate patchy white matter hypoattenuation, nonspecific but with chronic microvascular ischemic disease. Vascular: Redemonstrated coil embolization in the expected region the right ICA. Skull: No acute fracture. Sinuses/Orbits: Clear sinuses.  Unremarkable orbits. Other: No mastoid effusions CT CERVICAL SPINE FINDINGS Alignment: Similar 5 mm of anterolisthesis of C3 on C4 and 4 mm of anterolisthesis of C4 on C5. Similar mild lower anterolisthesis of C7 on T1 and retrolisthesis of C6 on C7. Skull base and vertebrae: Vertebral body heights are unchanged. No evidence of acute fracture. Soft tissues and spinal canal: No prevertebral fluid or swelling. No visible canal hematoma. Disc levels: Severe multilevel degenerative disc disease and facet arthropathy. Facet arthropathy is greatest and severe at C3-C4 and C4-C5, similar. Resulting varying degrees of neural foraminal stenosis. Upper  chest: Visualized lung apices are clear. Other: Calcific atherosclerosis. IMPRESSION: CT head: 1. No evidence of acute intracranial abnormality. 2. Moderate chronic microvascular ischemic disease. 3. Redemonstrated coil embolization in the expected region of the right ICA. CT cervical spine: 1. No evidence of acute fracture or traumatic malalignment. 2. Severe multilevel degenerative disease and facet arthropathy with similar degenerative anterolisthesis of C3 on C4 and C4 on C5, detailed above. Electronically Signed   By: Margaretha Sheffield M.D.   On: 06/28/2021 09:04   CT Cervical Spine Wo Contrast  Result Date: 06/28/2021 CLINICAL DATA:  Head trauma, minor (Age >= 65y); Neck trauma (Age >= 65y) EXAM: CT HEAD WITHOUT CONTRAST CT CERVICAL SPINE WITHOUT CONTRAST TECHNIQUE: Multidetector CT imaging of the head and cervical spine was performed following the standard protocol without intravenous contrast. Multiplanar CT image reconstructions of the cervical spine were also generated. RADIATION  DOSE REDUCTION: This exam was performed according to the departmental dose-optimization program which includes automated exposure control, adjustment of the mA and/or kV according to patient size and/or use of iterative reconstruction technique. COMPARISON:  04/02/2021. FINDINGS: CT HEAD FINDINGS Brain: No evidence of acute large vascular territory infarction, hemorrhage, hydrocephalus, extra-axial collection or mass lesion/mass effect. Moderate patchy white matter hypoattenuation, nonspecific but with chronic microvascular ischemic disease. Vascular: Redemonstrated coil embolization in the expected region the right ICA. Skull: No acute fracture. Sinuses/Orbits: Clear sinuses.  Unremarkable orbits. Other: No mastoid effusions CT CERVICAL SPINE FINDINGS Alignment: Similar 5 mm of anterolisthesis of C3 on C4 and 4 mm of anterolisthesis of C4 on C5. Similar mild lower anterolisthesis of C7 on T1 and retrolisthesis of C6 on C7.  Skull base and vertebrae: Vertebral body heights are unchanged. No evidence of acute fracture. Soft tissues and spinal canal: No prevertebral fluid or swelling. No visible canal hematoma. Disc levels: Severe multilevel degenerative disc disease and facet arthropathy. Facet arthropathy is greatest and severe at C3-C4 and C4-C5, similar. Resulting varying degrees of neural foraminal stenosis. Upper chest: Visualized lung apices are clear. Other: Calcific atherosclerosis. IMPRESSION: CT head: 1. No evidence of acute intracranial abnormality. 2. Moderate chronic microvascular ischemic disease. 3. Redemonstrated coil embolization in the expected region of the right ICA. CT cervical spine: 1. No evidence of acute fracture or traumatic malalignment. 2. Severe multilevel degenerative disease and facet arthropathy with similar degenerative anterolisthesis of C3 on C4 and C4 on C5, detailed above. Electronically Signed   By: Margaretha Sheffield M.D.   On: 06/28/2021 09:04   DG Hip Unilat W or Wo Pelvis 2-3 Views Left  Result Date: 06/28/2021 CLINICAL DATA:  71 year old female with history of trauma from a fall. Left-sided hip pain. EXAM: DG HIP (WITH OR WITHOUT PELVIS) 2-3V LEFT COMPARISON:  No priors. FINDINGS: Three nonstandard views of the bony pelvis in the left hip are submitted for evaluation. On these views, the bony pelvis appears intact, as does the visualized right proximal femur. Left femur appears internally rotated compared with a normal position, which limits evaluation of the femoral neck. With these limitations in mind, no definite acute displaced left femoral neck fracture is confidently identified. The femoral head is properly located. There is joint space narrowing, subchondral sclerosis and osteophyte formation in both hip joints, indicative of osteoarthritis. IMPRESSION: 1. Limited study demonstrating no definite acute radiographic abnormality of the left hip. If there is strong clinical concern for left  hip fracture, further evaluation with noncontrast CT of the pelvis could be obtained. 2. Moderate bilateral hip joint osteoarthritis. Electronically Signed   By: Vinnie Langton M.D.   On: 06/28/2021 06:15    Procedures Procedures    Medications Ordered in ED Medications  ibuprofen (ADVIL) tablet 600 mg (0 mg Oral Hold 06/28/21 0837)  sodium chloride 0.9 % bolus 500 mL (0 mLs Intravenous Stopped 06/28/21 0920)    ED Course/ Medical Decision Making/ A&P                           Medical Decision Making Amount and/or Complexity of Data Reviewed Labs: ordered. Radiology: ordered.   Patient's nasal bone x-ray shows a small fracture.  I personally reviewed these images.  After discussing that this can be treated with supportive care and follow-up with ENT, she is indicating she does not think she could walk or go home.  She is complaining of this bilateral hand  weakness that is preventing her from using her walker.  Thus labs and CT head/C-spine were obtained.  All of these are benign including the lab work which shows no significant electrolyte disturbance.  Chronic findings on the CT neck but nothing new.  My suspicion for acute spinal emergency is pretty low.  During all this, she was able to get up and walk with a walker with out difficulty.  Given this, there is no indication for her to be admitted.  No indication for further social work consultation as she had this extensively just a few days ago.  Will discharge.        Final Clinical Impression(s) / ED Diagnoses Final diagnoses:  Closed fracture of nasal bone, initial encounter  Generalized weakness    Rx / DC Orders ED Discharge Orders     None         Sherwood Gambler, MD 06/28/21 1502

## 2021-06-28 NOTE — ED Provider Notes (Signed)
?[facility] EMERGENCY DEPARTMENT ?Provider Note ? ? ?CSN: [id number] ?Arrival date & time: 06/28/21  0530 ? ?  ? ?History ? ?Chief Complaint  ?Patient presents with  ? Hip Pain  ? ? ?Angela Medina is a 71 y.o. female. ? ?The history is provided by the patient and medical records.  ? ?71 y.o. F with history of paranoia, schizoaffective disorder, homelessness, presenting to the ED after a fall.  States she lost her footing this morning and fell onto left hip.  She denies any head injury or loss of consciousness.  She was just discharged from ED last night after 3-day stay, was cleared from medical and psychiatric standpoint.  Still homeless, currently living in [place].  Has been seen by CSW many times over the past few days. ? ?Home Medications ?Prior to Admission medications   ?Medication Sig Start Date End Date Taking? Authorizing Provider  ?albuterol (VENTOLIN HFA) 108 (90 Base) MCG/ACT inhaler Inhale 2 puffs into the lungs every 6 (six) hours as needed for wheezing or shortness of breath. ?Patient not taking: Reported on 06/20/2021 04/02/21   Elige Radon, MD  ?doxycycline (VIBRAMYCIN) 100 MG capsule Take 1 capsule (100 mg total) by mouth 2 (two) times daily. ?Patient not taking: Reported on 06/20/2021 05/02/21   Alveria Apley, PA-C  ?OLANZapine (ZYPREXA) 5 MG tablet Take 1 tablet (5 mg total) by mouth at bedtime. 06/27/21   Derwood Kaplan, MD  ?zolpidem (AMBIEN) 5 MG tablet Take 1 tablet (5 mg total) by mouth at bedtime as needed for sleep. 06/27/21   Derwood Kaplan, MD  ?   ? ?Allergies    ?Penicillins   ? ?Review of Systems   ?Review of Systems  ?Musculoskeletal:  Positive for arthralgias.  ?All other systems reviewed and are negative. ? ?Physical Exam ?Updated Vital Signs ?BP (!) 162/146   Pulse 68   Temp 98.1 ?F (36.7 ?C)   Resp 18   Ht  (1.549 m)   Wt 63.5 kg   SpO2 97%   BMI 26.45 kg/m?  ? ?Physical Exam ?Vitals and nursing note reviewed.  ?Constitutional:   ?   Appearance: She  is well-developed.  ?   Comments: Disheveled appearing  ?HENT:  ?   Head: Normocephalic and atraumatic.  ?Eyes:  ?   Conjunctiva/sclera: Conjunctivae normal.  ?   Pupils: Pupils are equal, round, and reactive to light.  ?Cardiovascular:  ?   Rate and Rhythm: Normal rate and regular rhythm.  ?   Heart sounds: Normal heart sounds.  ?Pulmonary:  ?   Effort: Pulmonary effort is normal. No respiratory distress.  ?   Breath sounds: Normal breath sounds. No rhonchi.  ?Abdominal:  ?   General: Bowel sounds are normal.  ?   Palpations: Abdomen is soft.  ?   Tenderness: There is no abdominal tenderness. There is no rebound.  ?Musculoskeletal:     ?   General: Normal range of motion.  ?   Cervical back: Normal range of motion.  ?   Comments: Laying on left side of stretcher without apparent pain, no leg shortening or malrotation, DP pulse intact  ?Skin: ?   General: Skin is warm and dry.  ?Neurological:  ?   Mental Status: She is alert and oriented to person, place, and time.  ? ? ?ED Results / Procedures / Treatments   ?Labs ?(all labs ordered are listed, but only abnormal results are displayed) ?Labs Reviewed - No data to display ? ?  EKG ?None ? ?Radiology ?DG Hip Unilat W or Wo Pelvis 2-3 Views Left ? ?Result Date: 06/28/2021 ?CLINICAL DATA:  71 year old female with history of trauma from a fall. Left-sided hip pain. EXAM: DG HIP (WITH OR WITHOUT PELVIS) 2-3V LEFT COMPARISON:  No priors. FINDINGS: Three nonstandard views of the bony pelvis in the left hip are submitted for evaluation. On these views, the bony pelvis appears intact, as does the visualized right proximal femur. Left femur appears internally rotated compared with a normal position, which limits evaluation of the femoral neck. With these limitations in mind, no definite acute displaced left femoral neck fracture is confidently identified. The femoral head is properly located. There is joint space narrowing, subchondral sclerosis and osteophyte formation in both  hip joints, indicative of osteoarthritis. IMPRESSION: 1. Limited study demonstrating no definite acute radiographic abnormality of the left hip. If there is strong clinical concern for left hip fracture, further evaluation with noncontrast CT of the pelvis could be obtained. 2. Moderate bilateral hip joint osteoarthritis. Electronically Signed   By: Trudie Reed M.D.   On: 06/28/2021 06:15   ? ?Procedures ?Procedures  ? ? ?Medications Ordered in ED ?Medications - No data to display ? ?ED Course/ Medical Decision Making/ A&P ?  ?                        ?Medical Decision Making ?Amount and/or Complexity of Data Reviewed ?Radiology: ordered and independent interpretation performed. ? ? ?71 y.o. F here with left hip pain after falling.  Denies head injury or LOC.  Just left the ER a few hours ago after 3 days ED stay.  She has been evaluated by psychiatry and CSW many times over the past few days.  She does not meet criteria for SNF placement, but also not a candidate for home health as she is currently homeless.  She is AAOx3, no signs of head trauma.  She is actually laying on her left hip on stretcher, no apparent pain at present.  No leg shortening or malrotation. Will obtain screening x-ray. ? ?X-ray as above-- no definitive fracture.  Based on her exam findings, I have low suspicion for such as well.  Able to easily maneuver for her x-ray, returns to laying on left hip afterwards and is sleeping comfortably.  I do not feel she needs emergent CT of the hip at this time.  Patient will be discharged.  She has been given OP resources and is aware of local shelters in the area.  Can return here for new concerns. ? ?Final Clinical Impression(s) / ED Diagnoses ?Final diagnoses:  ?Left hip pain  ? ? ?Rx / DC Orders ?ED Discharge Orders   ? ? None  ? ?  ? ? ?  ?Garlon Hatchet, PA-C ?06/28/21 [phone] ? ?  ?Mesner, Barbara Cower, MD ?06/29/21 0122 ? ?

## 2021-06-28 NOTE — ED Triage Notes (Signed)
Patient here with complaint of nose pain, patient states she fell while waiting for a cab and hit her nose. Patient is alert and in no apparent distress at this time. ?

## 2021-06-28 NOTE — Discharge Instructions (Signed)
Follow-up with your doctor. Return here for new concerns. 

## 2021-06-28 NOTE — ED Notes (Signed)
Patient transported to X-ray 

## 2021-06-29 ENCOUNTER — Encounter (HOSPITAL_COMMUNITY): Payer: Self-pay | Admitting: Emergency Medicine

## 2021-06-29 ENCOUNTER — Emergency Department (HOSPITAL_COMMUNITY)
Admission: EM | Admit: 2021-06-29 | Discharge: 2021-06-29 | Disposition: A | Discharge status: Home / Self Care | Payer: Medicare Other | Attending: Emergency Medicine | Admitting: Emergency Medicine

## 2021-06-29 ENCOUNTER — Other Ambulatory Visit: Payer: Self-pay

## 2021-06-29 DIAGNOSIS — Y92410 Unspecified street and highway as the place of occurrence of the external cause: Secondary | ICD-10-CM | POA: Insufficient documentation

## 2021-06-29 DIAGNOSIS — W19XXXA Unspecified fall, initial encounter: Secondary | ICD-10-CM | POA: Insufficient documentation

## 2021-06-29 DIAGNOSIS — R531 Weakness: Secondary | ICD-10-CM | POA: Diagnosis not present

## 2021-06-29 DIAGNOSIS — M25552 Pain in left hip: Secondary | ICD-10-CM | POA: Insufficient documentation

## 2021-06-29 LAB — I-STAT CHEM 8, ED
BUN: 20 mg/dL (ref 8–23)
Calcium, Ion: 1.04 mmol/L — ABNORMAL LOW (ref 1.15–1.40)
Chloride: 100 mmol/L (ref 98–111)
Creatinine, Ser: 0.4 mg/dL — ABNORMAL LOW (ref 0.44–1.00)
Glucose, Bld: 90 mg/dL (ref 70–99)
HCT: 46 % (ref 36.0–46.0)
Hemoglobin: 15.6 g/dL — ABNORMAL HIGH (ref 12.0–15.0)
Potassium: 3.5 mmol/L (ref 3.5–5.1)
Sodium: 139 mmol/L (ref 135–145)
TCO2: 31 mmol/L (ref 22–32)

## 2021-06-29 NOTE — ED Notes (Signed)
Pt verbalizes understanding of discharge instructions. Opportunity for questions and answers were provided. Pt discharged from the ED.   ?

## 2021-06-29 NOTE — ED Notes (Signed)
Pt urinated and defecated on self, pt aware of doing so and instructed by this RN the importance of notifying staff prior to using the restroom on herself. Pt verbalizes understanding. Pt linens changed, brief changed, and placed in hospital gown. Pt given warm blankets. Pt resting comfortably at this time.  ?

## 2021-06-29 NOTE — ED Triage Notes (Addendum)
Pt BIB [facility] for fall. Pt was on the side of the street, pt c/o L hip pain. Pt clothes wet, may have been laying all night. Pt states she feels like she was poisoned, got water and food sometime last PM and believes someone poisoned her. States she feels this because she cant lay down properly and states her hands feel cold, denies this being from laying out in the cold all night.  ?

## 2021-06-29 NOTE — ED Provider Notes (Signed)
[facility] Provider Note   CSN: [id number] Arrival date & time: 06/29/21  4098     History  Chief Complaint  Patient presents with   Marletta Lor    [facility] is a 71 y.o. female.  HPI Patient presents for the third time in 24 hours, now arriving via EMS.  History is obtained by those individuals and the patient, as well as with chart review. Patient denies new fall, though that was the triage chief complaint.  She denies new pain, states that she typically has some degree of pain everywhere.  She denies any weakness, noting that she has weakness persistently.    Home Medications Prior to Admission medications   Medication Sig Start Date End Date Taking? Authorizing Provider  albuterol (VENTOLIN HFA) 108 (90 Base) MCG/ACT inhaler Inhale 2 puffs into the lungs every 6 (six) hours as needed for wheezing or shortness of breath. Patient not taking: Reported on 06/20/2021 04/02/21   Elige Radon, MD  doxycycline (VIBRAMYCIN) 100 MG capsule Take 1 capsule (100 mg total) by mouth 2 (two) times daily. Patient not taking: Reported on 06/20/2021 05/02/21   Caccavale, Sophia, PA-C  OLANZapine (ZYPREXA) 5 MG tablet Take 1 tablet (5 mg total) by mouth at bedtime. 06/27/21   Derwood Kaplan, MD  zolpidem (AMBIEN) 5 MG tablet Take 1 tablet (5 mg total) by mouth at bedtime as needed for sleep. 06/27/21   Derwood Kaplan, MD      Allergies    Penicillins    Review of Systems   Review of Systems  Constitutional:        Per HPI, otherwise negative  HENT:         Per HPI, otherwise negative  Respiratory:         Per HPI, otherwise negative  Cardiovascular:        Per HPI, otherwise negative  Gastrointestinal:  Negative for vomiting.  Endocrine:       Negative aside from HPI  Genitourinary:        Neg aside from HPI   Musculoskeletal:        Per HPI, otherwise negative  Skin: Negative.   Neurological:  Negative for syncope.   Physical  Exam Updated Vital Signs BP 131/86 (BP Location: Left Arm)    Pulse 76    Temp 98 F (36.7 C) (Oral)    Resp 19    SpO2 96%  Physical Exam Vitals and nursing note reviewed.  Constitutional:      General: She is not in acute distress.    Appearance: She is well-developed. She is ill-appearing. She is not toxic-appearing or diaphoretic.  HENT:     Head: Normocephalic and atraumatic.  Eyes:     Conjunctiva/sclera: Conjunctivae normal.  Cardiovascular:     Rate and Rhythm: Normal rate and regular rhythm.  Pulmonary:     Effort: Pulmonary effort is normal. No respiratory distress.     Breath sounds: Normal breath sounds. No stridor.  Abdominal:     General: There is no distension.  Skin:    General: Skin is warm and dry.  Neurological:     Mental Status: She is alert and oriented to person, place, and time.     Cranial Nerves: No cranial nerve deficit.     Comments: Patient uses walker for assistance, but gait is otherwise unremarkable  Psychiatric:     Comments: Slow to respond to questions but answers them appropriately, pleasantly interactive  ED Results / Procedures / Treatments   Labs (all labs ordered are listed, but only abnormal results are displayed) Labs Reviewed  I-STAT CHEM 8, ED - Abnormal; Notable for the following components:      Result Value   Creatinine, Ser 0.40 (*)    Calcium, Ion 1.04 (*)    Hemoglobin 15.6 (*)    All other components within normal limits    EKG None  Radiology DG Nasal Bones  Result Date: 06/28/2021 CLINICAL DATA:  Fall.  Nose injury EXAM: NASAL BONES - 3+ VIEW COMPARISON:  CT head 06/24/2021 FINDINGS: Fracture of the tip of the nasal bone without significant displacement. No other fracture identified Embolization coils in the region of the terminal right internal carotid artery. IMPRESSION: Nondisplaced fracture tip of the nasal bone. Electronically Signed   By: Marlan Palauharles  Clark M.D.   On: 06/28/2021 07:54   CT Head Wo  Contrast  Result Date: 06/28/2021 CLINICAL DATA:  Head trauma, minor (Age >= 65y); Neck trauma (Age >= 65y) EXAM: CT HEAD WITHOUT CONTRAST CT CERVICAL SPINE WITHOUT CONTRAST TECHNIQUE: Multidetector CT imaging of the head and cervical spine was performed following the standard protocol without intravenous contrast. Multiplanar CT image reconstructions of the cervical spine were also generated. RADIATION DOSE REDUCTION: This exam was performed according to the departmental dose-optimization program which includes automated exposure control, adjustment of the mA and/or kV according to patient size and/or use of iterative reconstruction technique. COMPARISON:  04/02/2021. FINDINGS: CT HEAD FINDINGS Brain: No evidence of acute large vascular territory infarction, hemorrhage, hydrocephalus, extra-axial collection or mass lesion/mass effect. Moderate patchy white matter hypoattenuation, nonspecific but with chronic microvascular ischemic disease. Vascular: Redemonstrated coil embolization in the expected region the right ICA. Skull: No acute fracture. Sinuses/Orbits: Clear sinuses.  Unremarkable orbits. Other: No mastoid effusions CT CERVICAL SPINE FINDINGS Alignment: Similar 5 mm of anterolisthesis of C3 on C4 and 4 mm of anterolisthesis of C4 on C5. Similar mild lower anterolisthesis of C7 on T1 and retrolisthesis of C6 on C7. Skull base and vertebrae: Vertebral body heights are unchanged. No evidence of acute fracture. Soft tissues and spinal canal: No prevertebral fluid or swelling. No visible canal hematoma. Disc levels: Severe multilevel degenerative disc disease and facet arthropathy. Facet arthropathy is greatest and severe at C3-C4 and C4-C5, similar. Resulting varying degrees of neural foraminal stenosis. Upper chest: Visualized lung apices are clear. Other: Calcific atherosclerosis. IMPRESSION: CT head: 1. No evidence of acute intracranial abnormality. 2. Moderate chronic microvascular ischemic disease. 3.  Redemonstrated coil embolization in the expected region of the right ICA. CT cervical spine: 1. No evidence of acute fracture or traumatic malalignment. 2. Severe multilevel degenerative disease and facet arthropathy with similar degenerative anterolisthesis of C3 on C4 and C4 on C5, detailed above. Electronically Signed   By: Feliberto HartsFrederick S Jones M.D.   On: 06/28/2021 09:04   CT Cervical Spine Wo Contrast  Result Date: 06/28/2021 CLINICAL DATA:  Head trauma, minor (Age >= 65y); Neck trauma (Age >= 65y) EXAM: CT HEAD WITHOUT CONTRAST CT CERVICAL SPINE WITHOUT CONTRAST TECHNIQUE: Multidetector CT imaging of the head and cervical spine was performed following the standard protocol without intravenous contrast. Multiplanar CT image reconstructions of the cervical spine were also generated. RADIATION DOSE REDUCTION: This exam was performed according to the departmental dose-optimization program which includes automated exposure control, adjustment of the mA and/or kV according to patient size and/or use of iterative reconstruction technique. COMPARISON:  04/02/2021. FINDINGS: CT HEAD FINDINGS Brain: No evidence  of acute large vascular territory infarction, hemorrhage, hydrocephalus, extra-axial collection or mass lesion/mass effect. Moderate patchy white matter hypoattenuation, nonspecific but with chronic microvascular ischemic disease. Vascular: Redemonstrated coil embolization in the expected region the right ICA. Skull: No acute fracture. Sinuses/Orbits: Clear sinuses.  Unremarkable orbits. Other: No mastoid effusions CT CERVICAL SPINE FINDINGS Alignment: Similar 5 mm of anterolisthesis of C3 on C4 and 4 mm of anterolisthesis of C4 on C5. Similar mild lower anterolisthesis of C7 on T1 and retrolisthesis of C6 on C7. Skull base and vertebrae: Vertebral body heights are unchanged. No evidence of acute fracture. Soft tissues and spinal canal: No prevertebral fluid or swelling. No visible canal hematoma. Disc levels:  Severe multilevel degenerative disc disease and facet arthropathy. Facet arthropathy is greatest and severe at C3-C4 and C4-C5, similar. Resulting varying degrees of neural foraminal stenosis. Upper chest: Visualized lung apices are clear. Other: Calcific atherosclerosis. IMPRESSION: CT head: 1. No evidence of acute intracranial abnormality. 2. Moderate chronic microvascular ischemic disease. 3. Redemonstrated coil embolization in the expected region of the right ICA. CT cervical spine: 1. No evidence of acute fracture or traumatic malalignment. 2. Severe multilevel degenerative disease and facet arthropathy with similar degenerative anterolisthesis of C3 on C4 and C4 on C5, detailed above. Electronically Signed   By: Feliberto Harts M.D.   On: 06/28/2021 09:04   CT Hip Left Wo Contrast  Result Date: 06/28/2021 CLINICAL DATA:  Hip pain, stress fracture suspected. Negative x-ray. Patient complains of left hip pain. EXAM: CT OF THE LEFT HIP WITHOUT CONTRAST TECHNIQUE: Multidetector CT imaging of the left hip was performed according to the standard protocol. Multiplanar CT image reconstructions were also generated. RADIATION DOSE REDUCTION: This exam was performed according to the departmental dose-optimization program which includes automated exposure control, adjustment of the mA and/or kV according to patient size and/or use of iterative reconstruction technique. COMPARISON:  None. FINDINGS: Bones/Joint/Cartilage No fracture or dislocation. Normal alignment. No joint effusion. Mild superolateral joint space narrowing. Ligaments Ligaments are suboptimally evaluated by CT. Muscles and Tendons Muscles are normal in bulk in density. No intramuscular hematoma or fluid collection. Soft tissue No fluid collection or hematoma. No soft tissue mass. Mild subcutaneous soft tissue edema and skin thickening in the gluteal region. Scattered colonic diverticula without evidence of acute diverticulitis. IMPRESSION: 1. No  evidence of fracture or dislocation. 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 2.  Muscles and tendons are intact. 3. Mild skin thickening and subcutaneous edema in the gluteal region, which may represent mild cellulitis. Clinical correlation is suggested. Electronically Signed   By: Larose Hires D.O.   On: 06/28/2021 16:22   DG Hip Unilat W or Wo Pelvis 2-3 Views Left  Result Date: 06/28/2021 CLINICAL DATA:  71 year old female with history of trauma from a fall. Left-sided hip pain. EXAM: DG HIP (WITH OR WITHOUT PELVIS) 2-3V LEFT COMPARISON:  No priors. FINDINGS: Three nonstandard views of the bony pelvis in the left hip are submitted for evaluation. On these views, the bony pelvis appears intact, as does the visualized right proximal femur. Left femur appears internally rotated compared with a normal position, which limits evaluation of the femoral neck. With these limitations in mind, no definite acute displaced left femoral neck fracture is confidently identified. The femoral head is properly located. There is joint space narrowing, subchondral sclerosis and osteophyte formation in both hip joints, indicative of osteoarthritis. IMPRESSION: 1. Limited study demonstrating no definite acute radiographic abnormality of the left hip. If there is strong clinical concern for  left hip fracture, further evaluation with noncontrast CT of the pelvis could be obtained. 2. Moderate bilateral hip joint osteoarthritis. Electronically Signed   By: Trudie Reed M.D.   On: 06/28/2021 06:15    Procedures Procedures    Medications Ordered in ED Medications - No data to display  ED Course/ Medical Decision Making/ A&P  This patient presents to the ED for concern of weakness, this involves an extensive number of treatment options, and is a complaint that carries with it a high risk of complications and morbidity.  The differential diagnosis includes progression of disease, schizophrenic episode, fall, trauma,  dehydration   Co morbidities that complicate the patient evaluation  Schizophrenia, ADHD, alcohol abuse   Social Determinants of Health:  Alcohol abuse, psychiatric disease   Additional history obtained:  Additional history and/or information obtained from EMS, social work External records from outside source obtained and reviewed including discussed patient's case at length with our social work team.  They note that she has been seen, evaluated, by his individuals as well as by psychiatry within the past weeks.  Patient has been designated as psychiatrically stable, clear from our behavioral colleagues.  Social work has evaluated options for placement including SNF several times, she does not meet criteria.  She has been provided resources for shelters and outpatient services multiple times as well   After the initial evaluation, orders, including: Lab were initiated.   The patient was also maintained on pulse oximetry. The readings were typically 99% room air normal                          Medical Decision Making On repeat exam the patient is ambulatory with a walker, has been cleaned with the assistance of nursing staff, labs have been discussed, reviewed, chemistry panel with no evidence for renal dysfunction, no dehydration, no electrolyte disturbances.  This adult female with multiple prior recent ED visits presents via EMS, possibly because it is raining outside and she is homeless, but with complaints of weakness.  No evidence for any fall, trauma, no evidence for acute new medical pathology.  As above Case discussed at length with our social work colleagues given the patient's frequent visits.  Patient received assistance to discharge to local outpatient [facility] for homeless services and assistance absent alarming findings, as above. Final Clinical Impression(s) / ED Diagnoses Final diagnoses:  Weakness     Gerhard Munch, MD 06/29/21 1203

## 2021-06-29 NOTE — Discharge Instructions (Signed)
This evaluation in the emergency department has been reassuring.  However, with your homelessness, medical issues, it is important that you follow-up at the [facility].  Monitor your condition carefully and do not hesitate to return here or to another emergency department if you develop new, or concerning changes in your condition. ?

## 2021-06-30 ENCOUNTER — Encounter (HOSPITAL_COMMUNITY): Payer: Self-pay

## 2021-06-30 ENCOUNTER — Emergency Department (HOSPITAL_COMMUNITY): Payer: Medicare Other

## 2021-06-30 ENCOUNTER — Emergency Department (HOSPITAL_COMMUNITY)
Admission: EM | Admit: 2021-06-30 | Discharge: 2021-06-30 | Disposition: A | Discharge status: Home / Self Care | Payer: Medicare Other | Source: Home / Self Care | Attending: Emergency Medicine | Admitting: Emergency Medicine

## 2021-06-30 ENCOUNTER — Emergency Department (HOSPITAL_COMMUNITY)
Admission: EM | Admit: 2021-06-30 | Discharge: 2021-06-30 | Disposition: A | Discharge status: Home / Self Care | Payer: Medicare Other | Attending: Emergency Medicine | Admitting: Emergency Medicine

## 2021-06-30 ENCOUNTER — Other Ambulatory Visit: Payer: Self-pay

## 2021-06-30 DIAGNOSIS — R0781 Pleurodynia: Secondary | ICD-10-CM | POA: Insufficient documentation

## 2021-06-30 DIAGNOSIS — R52 Pain, unspecified: Secondary | ICD-10-CM | POA: Insufficient documentation

## 2021-06-30 DIAGNOSIS — Z5900 Homelessness unspecified: Secondary | ICD-10-CM | POA: Insufficient documentation

## 2021-06-30 DIAGNOSIS — R413 Other amnesia: Secondary | ICD-10-CM | POA: Diagnosis not present

## 2021-06-30 DIAGNOSIS — R531 Weakness: Secondary | ICD-10-CM | POA: Insufficient documentation

## 2021-06-30 DIAGNOSIS — R4689 Other symptoms and signs involving appearance and behavior: Secondary | ICD-10-CM | POA: Insufficient documentation

## 2021-06-30 DIAGNOSIS — R682 Dry mouth, unspecified: Secondary | ICD-10-CM | POA: Insufficient documentation

## 2021-06-30 DIAGNOSIS — W19XXXA Unspecified fall, initial encounter: Secondary | ICD-10-CM | POA: Diagnosis not present

## 2021-06-30 NOTE — ED Triage Notes (Signed)
Pt arrived via EMS d/t multiple complaints. Pt was evaluated at [facility] earlier today. Pt c/o hurting everywhere, wants to be treated by a real doctor. Vitals are stable.  ?

## 2021-06-30 NOTE — Discharge Instructions (Signed)
Please be sure to follow-up at the [facility] on Monday for ongoing monitoring as needed of your discomfort.  Today's evaluation, similar to yesterday's has been reassuring, and on reviewing your x-rays, CT scans from within the past 2 weeks, there is no evidence for new broken bones or other acute findings. ?
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
## 2021-06-30 NOTE — ED Notes (Signed)
Pt swearing at this RN, stated " you don't know sh**" . ?

## 2021-06-30 NOTE — ED Provider Notes (Signed)
?[facility]-EMERGENCY DEPT ?Provider Note ? ? ?CSN: [id number] ?Arrival date & time: 06/30/21  1346 ? ?  ? ?History ? ?Chief Complaint  ?Patient presents with  ? Weakness  ? ? ?Angela Medina is a 71 y.o. female. ? ?HPI ?Patient presents for evaluation for the second time in 24 hours, fourth time in 48 hours.  She notes that after I saw and evaluated her yesterday she utilized the provided [facility] transportation did not go to [facility] center as advised, but to go to another location.  She notes that since that time she has had no additional falls, has no new pain, but continues to have some discomfort from fall that occurred previously.  She continues to smoke cigarettes, is unclear if she is taking her medication.  She request additional evaluation for discomfort following a prior fall. ?  ? ?Home Medications ?Prior to Admission medications   ?Medication Sig Start Date End Date Taking? Authorizing Provider  ?albuterol (VENTOLIN HFA) 108 (90 Base) MCG/ACT inhaler Inhale 2 puffs into the lungs every 6 (six) hours as needed for wheezing or shortness of breath. ?Patient not taking: Reported on 06/20/2021 04/02/21   Elige Radon, MD  ?doxycycline (VIBRAMYCIN) 100 MG capsule Take 1 capsule (100 mg total) by mouth 2 (two) times daily. ?Patient not taking: Reported on 06/20/2021 05/02/21   Alveria Apley, PA-C  ?OLANZapine (ZYPREXA) 5 MG tablet Take 1 tablet (5 mg total) by mouth at bedtime. 06/27/21   Derwood Kaplan, MD  ?zolpidem (AMBIEN) 5 MG tablet Take 1 tablet (5 mg total) by mouth at bedtime as needed for sleep. 06/27/21   Derwood Kaplan, MD  ?   ? ?Allergies    ?Penicillins   ? ?Review of Systems   ?Review of Systems  ?Constitutional:   ?     Per HPI, otherwise negative  ?HENT:    ?     Per HPI, otherwise negative  ?Respiratory:    ?     Per HPI, otherwise negative  ?Cardiovascular:   ?     Per HPI, otherwise negative  ?Gastrointestinal:  Negative for vomiting.  ?Endocrine:  ?      Negative aside from HPI  ?Genitourinary:   ?     Neg aside from HPI   ?Musculoskeletal:   ?     Per HPI, otherwise negative  ?Skin: Negative.   ?Neurological:  Negative for syncope.  ? ?Physical Exam ?Updated Vital Signs ?BP (!) 149/100 (BP Location: Left Arm)   Pulse 89   Temp 98 ?F (36.7 ?C) (Oral)   Resp 16   SpO2 97%  ?Physical Exam ?Vitals and nursing note reviewed.  ?Constitutional:   ?   General: She is not in acute distress. ?   Appearance: She is well-developed.  ?HENT:  ?   Head: Normocephalic and atraumatic.  ?Eyes:  ?   Conjunctiva/sclera: Conjunctivae normal.  ?Cardiovascular:  ?   Rate and Rhythm: Normal rate and regular rhythm.  ?Pulmonary:  ?   Effort: Pulmonary effort is normal. No respiratory distress.  ?   Breath sounds: Normal breath sounds. No stridor.  ?Chest:  ?   Comments: Palpable xiphoid process, which the patient requests evaluation of due to discomfort ?Abdominal:  ?   General: There is no distension.  ?Skin: ?   General: Skin is warm and dry.  ?Neurological:  ?   Mental Status: She is alert and oriented to person, place, and time.  ?   Cranial  Nerves: No cranial nerve deficit.  ?   Motor: No abnormal muscle tone.  ?Psychiatric:     ?   Attention and Perception: She is inattentive.     ?   Behavior: Behavior is slowed.     ?   Cognition and Memory: Memory is impaired.  ? ? ?ED Results / Procedures / Treatments   ? ? ?Radiology ?CT Hip Left Wo Contrast ? ?Result Date: 06/28/2021 ?CLINICAL DATA:  Hip pain, stress fracture suspected. Negative x-ray. Patient complains of left hip pain. EXAM: CT OF THE LEFT HIP WITHOUT CONTRAST TECHNIQUE: Multidetector CT imaging of the left hip was performed according to the standard protocol. Multiplanar CT image reconstructions were also generated. RADIATION DOSE REDUCTION: This exam was performed according to the departmental dose-optimization program which includes automated exposure control, adjustment of the mA and/or kV according to patient size  and/or use of iterative reconstruction technique. COMPARISON:  None. FINDINGS: Bones/Joint/Cartilage No fracture or dislocation. Normal alignment. No joint effusion. Mild superolateral joint space narrowing. Ligaments Ligaments are suboptimally evaluated by CT. Muscles and Tendons Muscles are normal in bulk in density. No intramuscular hematoma or fluid collection. Soft tissue No fluid collection or hematoma. No soft tissue mass. Mild subcutaneous soft tissue edema and skin thickening in the gluteal region. Scattered colonic diverticula without evidence of acute diverticulitis. IMPRESSION: 1. No evidence of fracture or dislocation. 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 2.  Muscles and tendons are intact. 3. Mild skin thickening and subcutaneous edema in the gluteal region, which may represent mild cellulitis. Clinical correlation is suggested. Electronically Signed   By: Larose HiresImran  Ahmed D.O.   On: 06/28/2021 16:22   ? ?Procedures ?Procedures  ? ? ?Medications Ordered in ED ?Medications - No data to display ? ?ED Course/ Medical Decision Making/ A&P ?This adult female presents with request for evaluation of ongoing discomfort following a fall that occurred prior to her most recent evaluations.  Here she is awake, alert, sitting upright, speaking clearly, moving all extremities spontaneously to command.  She is clear breath sounds, has no hemodynamic instability, no evidence for distress, and absent new fall, no indication for advanced additional or other imaging studies today.  I reviewed her chart including imaging study as above, CT hip without fracture, and x-ray from last week that did not demonstrate sternal abnormalities.  This seems to be her area of most discomfort, and on exam she is a palpable xiphoid process.  Her inconsistent with obtaining care, following outpatient instructions, is somewhat prohibitive, the patient has been provided transportation recently, social work is seen and evaluated her, and again  she was encouraged to follow-up with outpatient providers, utilize the provided resources for living assistance, outpatient health care assistance, and social services. ?Final Clinical Impression(s) / ED Diagnoses ?Final diagnoses:  ?Pain  ? ?  ?Gerhard MunchLockwood, Kaeo Jacome, MD ?06/30/21 1445 ? ?

## 2021-06-30 NOTE — ED Triage Notes (Signed)
Pt arrived via EMS, states her "bones hurt" Recent fall, has been seen recently for same.  ?

## 2021-07-01 ENCOUNTER — Emergency Department (HOSPITAL_COMMUNITY)
Admission: EM | Admit: 2021-07-01 | Discharge: 2021-07-01 | Disposition: A | Discharge status: Home / Self Care | Payer: Medicare Other | Attending: Emergency Medicine | Admitting: Emergency Medicine

## 2021-07-01 DIAGNOSIS — J3489 Other specified disorders of nose and nasal sinuses: Secondary | ICD-10-CM | POA: Diagnosis present

## 2021-07-01 DIAGNOSIS — Z5900 Homelessness unspecified: Secondary | ICD-10-CM | POA: Diagnosis not present

## 2021-07-01 NOTE — Care Management (Addendum)
[facility] is open tonight starting at 7pm for cold weather shelter. Patient has been back into the ED malingering, swearing at staff. She had been psych cleared for outpatient services. She has been given clothes recently. She does not have enough money for a hotel.Marland Kitchen Messaged RN and providers with [facility] hours . We can give her a bus pass to present there. ?

## 2021-07-01 NOTE — ED Notes (Signed)
Rn tried to ambulate pt. Pt refused to ambulate.RN witnessed pt move all extremities charge RN notified.  ?
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
## 2021-07-01 NOTE — ED Notes (Signed)
Pt escorted to waiting room with security. Upon ambulation to wheelchair pt verbal abusive and cursing at staff.  ?

## 2021-07-01 NOTE — ED Notes (Signed)
Pt reports she has no place to go if dc'd, pt states she doesn't want to leave. PA aware. Pt able to tolerate PO fluid/food ?

## 2021-07-01 NOTE — ED Provider Notes (Signed)
?[facility] ?Provider Note ? ? ?CSN: [id number] ?Arrival date & time: 07/01/21  1601 ? ?  ? ?History ? ?Chief Complaint  ?Patient presents with  ? multiple complaints  ? ? ?Angela Medina is a 71 y.o. female with a PMHx of depression, schizoaffective disorder who presents to the ED complaining of food poisoning. Pt notes that she had beef stew and concerned for food poisoning due to gagging.  Patient notes that she has had rhinorrhea.  Denies fever, chills, nausea, vomiting.  Denies fall, trauma, injury at this time. ? ? ?The history is provided by the patient. No language interpreter was used.  ? ?  ? ?Home Medications ?Prior to Admission medications   ?Medication Sig Start Date End Date Taking? Authorizing Provider  ?albuterol (VENTOLIN HFA) 108 (90 Base) MCG/ACT inhaler Inhale 2 puffs into the lungs every 6 (six) hours as needed for wheezing or shortness of breath. ?Patient not taking: Reported on 06/20/2021 04/02/21   Elige Radonhristian, Rylee, MD  ?doxycycline (VIBRAMYCIN) 100 MG capsule Take 1 capsule (100 mg total) by mouth 2 (two) times daily. ?Patient not taking: Reported on 06/20/2021 05/02/21   Alveria Apleyaccavale, Sophia, PA-C  ?OLANZapine (ZYPREXA) 5 MG tablet Take 1 tablet (5 mg total) by mouth at bedtime. 06/27/21   Derwood KaplanNanavati, Ankit, MD  ?zolpidem (AMBIEN) 5 MG tablet Take 1 tablet (5 mg total) by mouth at bedtime as needed for sleep. 06/27/21   Derwood KaplanNanavati, Ankit, MD  ?   ? ?Allergies    ?Penicillins   ? ?Review of Systems   ?Review of Systems  ?Constitutional:  Negative for chills and fever.  ?Gastrointestinal:  Negative for nausea and vomiting.  ?All other systems reviewed and are negative. ? ?Physical Exam ?Updated Vital Signs ?BP 105/65   Pulse 90   Temp 98.4 ?F (36.9 ?C) (Oral)   Resp 16   SpO2 98%  ?Physical Exam ?Vitals and nursing note reviewed.  ?Constitutional:   ?   General: She is not in acute distress. ?   Appearance: She is not diaphoretic.  ?HENT:  ?   Head: Normocephalic  and atraumatic.  ?   Mouth/Throat:  ?   Pharynx: No oropharyngeal exudate.  ?Eyes:  ?   General: No scleral icterus. ?   Conjunctiva/sclera: Conjunctivae normal.  ?Cardiovascular:  ?   Rate and Rhythm: Normal rate and regular rhythm.  ?   Pulses: Normal pulses.  ?   Heart sounds: Normal heart sounds.  ?Pulmonary:  ?   Effort: Pulmonary effort is normal. No respiratory distress.  ?   Breath sounds: Normal breath sounds. No wheezing.  ?Abdominal:  ?   General: Bowel sounds are normal.  ?   Palpations: Abdomen is soft. There is no mass.  ?   Tenderness: There is no abdominal tenderness. There is no guarding or rebound.  ?Musculoskeletal:     ?   General: Normal range of motion.  ?   Cervical back: Normal range of motion and neck supple.  ?   Comments: Strength and sensation intact to bilateral upper and lower extremities.  ?Skin: ?   General: Skin is warm and dry.  ?Neurological:  ?   Mental Status: She is alert.  ?Psychiatric:     ?   Behavior: Behavior normal.  ? ? ?ED Results / Procedures / Treatments   ?Labs ?(all labs ordered are listed, but only abnormal results are displayed) ?Labs Reviewed - No data to display ? ?EKG ?None ? ?Radiology ?  DG Chest 2 View ? ?Result Date: 06/30/2021 ?CLINICAL DATA:  Chest pain. EXAM: CHEST - 2 VIEW COMPARISON:  Chest radiograph dated 05/01/2021. FINDINGS: Mild eventration of the left hemidiaphragm. No focal consolidation, pleural effusion, pneumothorax. The cardiac silhouette is within limits. Atherosclerotic calcification of the aorta. Degenerative changes of the spine and shoulders. No acute osseous pathology. IMPRESSION: No active cardiopulmonary disease. Electronically Signed   By: Elgie Collard M.D.   On: 06/30/2021 20:43   ? ?Procedures ?Procedures  ? ? ?Medications Ordered in ED ?Medications - No data to display ? ?ED Course/ Medical Decision Making/ A&P ?Clinical Course as of 07/02/21 0000  ?Wynelle Link Jul 01, 2021  ?71 Notified by RN that patient is now refusing to leave the  ED and that the patient wants to stay the night. Notified by RN that patient stated that she does not want to leave the hospital due to having no place to go. [SB]  ?1537 Patient visualized able to move all extremities without difficulty prior to discharge. [SB]  ?  ?Clinical Course User Index ?[SB] Cristofher Livecchi A, PA-C  ? ?                        ?Medical Decision Making ? ?Pt presents with rhinorrhea and concerns for food poisoning onset today.  Vital signs stable, patient afebrile, not tachycardic or hypoxic.  On exam patient without acute cardiovascular, respiratory, abdominal exam findings.  Patient able to move all extremities without difficulty during exam. ? ? ?Disposition: ?Doubt patient with food poisoning at this time, patient asymptomatic without nausea, vomiting, fever, chills, abdominal tenderness to palpation.  In-depth conversation held with patient at bedside regarding due to absence of nausea, vomiting, abdominal pain, fever, chills, food poisoning at this time is unlikely. We discussed with patient that she will be discharged, patient jumped up out of her chair and stated "I would like to speak to the doctor, I do not want to leave, I do not have anywhere to go.  I am not able to move my arms or legs."  Discussed with patient again regarding discharge treatment plan and offered to provide patient with a bus pass as well as sandwich and a drink.  Visualized patient moving all extremities and sitting up on her own without difficulty prior to discharge.  Feel the patient would benefit from discharge home.  Supportive care measures and strict turn precautions discussed with patient at bedside.  Patient knowledges and verbalized understanding.  Patient appears safe for discharge.  Follow-up as indicated discharge paperwork. ? ?This chart was dictated using voice recognition software, Dragon. Despite the best efforts of this provider to proofread and correct errors, errors may still occur which can  change documentation meaning. ? ?Final Clinical Impression(s) / ED Diagnoses ?Final diagnoses:  ?Homeless  ?Rhinorrhea  ? ? ?Rx / DC Orders ?ED Discharge Orders   ? ? None  ? ?  ? ? ?  ?Sotero Brinkmeyer A, PA-C ?07/02/21 0000 ? ?  ?Benjiman Core, MD ?07/02/21 1456 ? ?

## 2021-07-01 NOTE — ED Notes (Signed)
Pt given new paper scrubs and socks.  ?

## 2021-07-02 ENCOUNTER — Emergency Department (HOSPITAL_COMMUNITY): Payer: Medicare Other

## 2021-07-02 ENCOUNTER — Emergency Department (HOSPITAL_COMMUNITY)
Admission: EM | Admit: 2021-07-02 | Discharge: 2021-07-02 | Disposition: A | Discharge status: Home / Self Care | Payer: Medicare Other | Attending: Emergency Medicine | Admitting: Emergency Medicine

## 2021-07-02 ENCOUNTER — Encounter (HOSPITAL_COMMUNITY): Payer: Self-pay | Admitting: Emergency Medicine

## 2021-07-02 DIAGNOSIS — Z765 Malingerer [conscious simulation]: Secondary | ICD-10-CM | POA: Insufficient documentation

## 2021-07-02 DIAGNOSIS — R519 Headache, unspecified: Secondary | ICD-10-CM | POA: Insufficient documentation

## 2021-07-02 DIAGNOSIS — Y92521 Bus station as the place of occurrence of the external cause: Secondary | ICD-10-CM | POA: Diagnosis not present

## 2021-07-02 DIAGNOSIS — M542 Cervicalgia: Secondary | ICD-10-CM | POA: Diagnosis not present

## 2021-07-02 DIAGNOSIS — M549 Dorsalgia, unspecified: Secondary | ICD-10-CM | POA: Insufficient documentation

## 2021-07-02 DIAGNOSIS — W19XXXA Unspecified fall, initial encounter: Secondary | ICD-10-CM

## 2021-07-02 MED ORDER — NAPROXEN 250 MG PO TABS
500.0000 mg | ORAL_TABLET | Freq: Once | ORAL | Status: DC
Start: 2021-07-02 — End: 2021-07-02

## 2021-07-02 NOTE — ED Notes (Signed)
Pt refused to ambulate w/provider. Began yelling at staff, yelled obscenities at this RN. Pt is demanding to leave and pt has been medically cleared to discharge. Pt is agreeable to discharge  ?

## 2021-07-02 NOTE — ED Notes (Signed)
Pt refused vitals 

## 2021-07-02 NOTE — ED Provider Triage Note (Signed)
Emergency Medicine Provider Triage Evaluation Note ? ?Angela Medina , a 71 y.o. female  was evaluated in triage.  Pt complains of fall. Pt was at the bus stop after being discharged last night and states she fell out of her wheelchair. She states she hit her head. ? ?Review of Systems  ?Positive: Head injury ?Negative: loc ? ?Physical Exam  ?BP 135/90 (BP Location: Right Arm)   Pulse 79   Temp 97.7 ?F (36.5 ?C) (Oral)   Resp 16   SpO2 95%  ?Gen:   Awake, no distress   ?Resp:  Normal effort  ?MSK:   Moves extremities without difficulty  ?Other:  Clear speech, no facial droop ? ?Medical Decision Making  ?Medically screening exam initiated at 12:37 PM.  Appropriate orders placed.  Angela Medina was informed that the remainder of the evaluation will be completed by another provider, this initial triage assessment does not replace that evaluation, and the importance of remaining in the ED until their evaluation is complete. ? ? ?  ?[name], Tikita Mabee S, PA-C ?07/02/21 1237 ? ?

## 2021-07-02 NOTE — Progress Notes (Signed)
CSW confirmed with [facility] [employer] who completed a medicaid screening with patient that she does not qualify for full medicaid at this time. Patients medicaid benefits for family planning are through [facility]. Due to patient not qualifying for full medicaid she will not qualify for long term placement.  ?
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
## 2021-07-02 NOTE — ED Provider Notes (Signed)
?[facility] ?Provider Note ? ? ?CSN: [id number] ?Arrival date & time: 07/02/21  1224 ? ?  ? ?History ? ?Chief Complaint  ?Patient presents with  ? Fall  ? ? ?Jacqualin CombesRebecca S Gebert is a 71 y.o. female presenting with pain in her head, neck, back and all down her legs after falling out of her wheelchair.  Of note, patient was discharged from the emergency department last night with multiple complaints.  This is the patient's 16th visit of March.  [facility] however she was denied by multiple facilities. ? ?Reports she can hardly walk.  Also complaining of headache and neck pain.  ROS and panpositive. ? ?  ? ?Home Medications ?Prior to Admission medications   ?Medication Sig Start Date End Date Taking? Authorizing Provider  ?albuterol (VENTOLIN HFA) 108 (90 Base) MCG/ACT inhaler Inhale 2 puffs into the lungs every 6 (six) hours as needed for wheezing or shortness of breath. ?Patient not taking: Reported on 06/20/2021 04/02/21   Elige Radonhristian, Rylee, MD  ?doxycycline (VIBRAMYCIN) 100 MG capsule Take 1 capsule (100 mg total) by mouth 2 (two) times daily. ?Patient not taking: Reported on 06/20/2021 05/02/21   Alveria Apleyaccavale, Sophia, PA-C  ?OLANZapine (ZYPREXA) 5 MG tablet Take 1 tablet (5 mg total) by mouth at bedtime. 06/27/21   Derwood KaplanNanavati, Ankit, MD  ?zolpidem (AMBIEN) 5 MG tablet Take 1 tablet (5 mg total) by mouth at bedtime as needed for sleep. 06/27/21   Derwood KaplanNanavati, Ankit, MD  ?   ? ?Allergies    ?Penicillins   ? ?Review of Systems   ?Review of Systems ? ?Physical Exam ?Updated Vital Signs ?BP 135/90 (BP Location: Right Arm)   Pulse 79   Temp 97.7 ?F (36.5 ?C) (Oral)   Resp 16   SpO2 95%  ?Physical Exam ?Constitutional:   ?   Comments: Disheveled and unkempt  ?HENT:  ?   Head: Normocephalic and atraumatic.  ?Musculoskeletal:  ?   Cervical back: Normal range of motion.  ?Neurological:  ?   Mental Status: She is alert.  ?   Gait: Gait  normal.  ?   Comments: Patient unable to ambulate with walker.  ?Psychiatric:  ?   Comments: Patient verbally aggressive.  Yelling at medical staff and family members of surrounding patients.  ? ? ?ED Results / Procedures / Treatments   ?Labs ?(all labs ordered are listed, but only abnormal results are displayed) ?Labs Reviewed - No data to display ? ?EKG ?None ? ?Radiology ?CT Head Wo Contrast ? ?Result Date: 07/02/2021 ?CLINICAL DATA:  Fall, head trauma EXAM: CT HEAD WITHOUT CONTRAST TECHNIQUE: Contiguous axial images were obtained from the base of the skull through the vertex without intravenous contrast. RADIATION DOSE REDUCTION: This exam was performed according to the departmental dose-optimization program which includes automated exposure control, adjustment of the mA and/or kV according to patient size and/or use of iterative reconstruction technique. COMPARISON:  06/28/2021 FINDINGS: Brain: No evidence of acute infarction, hemorrhage, hydrocephalus, extra-axial collection or mass lesion/mass effect. Mild periventricular and deep white matter hypodensity. Vascular: Dense metallic coil material in the vicinity of the right ICA terminus (series 3, image 9). Skull: Normal. Negative for fracture or focal lesion. Sinuses/Orbits: No acute finding. Other: None. IMPRESSION: 1. No acute intracranial pathology. Small-vessel white matter disease. 2. Dense metallic coil material in the vicinity of the right ICA terminus. Electronically Signed   By: Bonna GainsAlex D Bibbey M.D.  On: 07/02/2021 13:45   ? ?Procedures ?Procedures  ? ? ?Medications Ordered in ED ?Medications  ?naproxen (NAPROSYN) tablet 500 mg (has no administration in time range)  ? ? ?ED Course/ Medical Decision Making/ A&P ?Clinical Course as of 07/02/21 1450  ?Mon Jul 02, 2021  ?1450 Patient yelling profanities at medical staff.  Says that she would like to be rolled out of here because she will not take care from this hospital.  At the time of her outburst, she  was ambulating successfully with a walker.  Discharged per request [MR]  ?  ?Clinical Course User Index ?[MR] Aiyannah Fayad A, PA-C  ? ?                        ?Medical Decision Making ?Risk ?Prescription drug management. ? ? ?71 year old female with an extensive psychiatric history presenting today with after a fall in her wheelchair.  Reports pain all over, most of which in her head, neck and back. ? ?CT head ordered in triage and viewed by me.  No obvious abnormalities.  Radiology reports some small vessel white matter disease. ? ?MDM/disposition: Patient with aggressive outburst towards medical staff.  Request discharge at this time.  Papers printed and patient pulled out. ? ?Final Clinical Impression(s) / ED Diagnoses ?Final diagnoses:  ?Fall, initial encounter  ?Malingering  ? ? ?  ?Saddie Benders, PA-C ?07/02/21 1453 ? ?  ?Ernie Avena, MD ?07/02/21 1718 ? ?

## 2021-07-02 NOTE — ED Triage Notes (Signed)
Patient BIB [facility] from bus stop after she states fell out of her wheelchair. Patient is alert and in no apparent distress at this time. ?

## 2021-07-03 ENCOUNTER — Emergency Department (HOSPITAL_COMMUNITY)
Admission: EM | Admit: 2021-07-03 | Discharge: 2021-07-03 | Disposition: A | Discharge status: Home / Self Care | Payer: Medicare Other | Attending: Emergency Medicine | Admitting: Emergency Medicine

## 2021-07-03 DIAGNOSIS — Z5900 Homelessness unspecified: Secondary | ICD-10-CM | POA: Diagnosis not present

## 2021-07-03 DIAGNOSIS — M79604 Pain in right leg: Secondary | ICD-10-CM | POA: Diagnosis present

## 2021-07-03 DIAGNOSIS — M79605 Pain in left leg: Secondary | ICD-10-CM | POA: Insufficient documentation

## 2021-07-03 NOTE — ED Triage Notes (Signed)
Pt. Stated, Im trying to get a place to stay and I know theres a place in [place] I just need a ride. Pt was at the bus stop and had somebody to call 911. ?pT JUST LEFT LAST NIGHT. ?

## 2021-07-03 NOTE — ED Notes (Signed)
[facility] Pt refused. Pt spoke to sister on phone asking for help. Pt sister offers no help. Pt wanted cab to be taken to another hospital. Pt given options available and refused all options,  ?

## 2021-07-03 NOTE — Progress Notes (Signed)
[facility] report filed. APS will contact CSW back with the screening decision.  ?

## 2021-07-03 NOTE — ED Notes (Signed)
Pt verbally abusive to staff upon departure. ?

## 2021-07-03 NOTE — Progress Notes (Signed)
CSW contacted [facility]PS to file a report. CSW left a message requesting call back.  ?
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
## 2021-07-05 ENCOUNTER — Encounter (HOSPITAL_COMMUNITY): Payer: Self-pay | Admitting: Emergency Medicine

## 2021-07-05 ENCOUNTER — Other Ambulatory Visit: Payer: Self-pay

## 2021-07-05 ENCOUNTER — Emergency Department (HOSPITAL_COMMUNITY)
Admission: EM | Admit: 2021-07-05 | Discharge: 2021-07-05 | Disposition: A | Discharge status: Home / Self Care | Payer: Medicare Other | Attending: Emergency Medicine | Admitting: Emergency Medicine

## 2021-07-05 ENCOUNTER — Emergency Department (HOSPITAL_COMMUNITY): Payer: Medicare Other

## 2021-07-05 DIAGNOSIS — M25552 Pain in left hip: Secondary | ICD-10-CM | POA: Insufficient documentation

## 2021-07-05 DIAGNOSIS — Z5900 Homelessness unspecified: Secondary | ICD-10-CM | POA: Diagnosis not present

## 2021-07-05 DIAGNOSIS — M25562 Pain in left knee: Secondary | ICD-10-CM | POA: Insufficient documentation
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
## 2021-07-05 NOTE — ED Notes (Signed)
Pt left building not sure as to why, but she stated she wanted to go to the bus station and leave. ?

## 2021-07-05 NOTE — ED Provider Notes (Signed)
?[facility] EMERGENCY DEPARTMENT ?Provider Note ? ? ?CSN: [id number] ?Arrival date & time: 07/05/21  1558 ? ?  ? ?History ? ?Chief Complaint  ?Patient presents with  ? Muscle Pain  ? Hip Pain  ? ? ?Angela Medina is a 71 y.o. female. ? ?HPI ?Patient presents for the 20th time in 6 months.  She presents after possible fall, complaining of pain in several areas, though the response which areas hurt is different depending on who asks her and when.  Seemingly no loss of consciousness, no new weakness in any extremity.  I discussed her case with our social worker she saw her within the past few days after the patient arrived.  They note that she was recently discharged with a walker, with transportation to a local shelter.  Multiple efforts have been made to attempt for additional placement, provided resources, patient has declined these, and/or has not qualified. ?  ? ?Home Medications ?Prior to Admission medications   ?Medication Sig Start Date End Date Taking? Authorizing Provider  ?albuterol (VENTOLIN HFA) 108 (90 Base) MCG/ACT inhaler Inhale 2 puffs into the lungs every 6 (six) hours as needed for wheezing or shortness of breath. ?Patient not taking: Reported on 06/20/2021 04/02/21   Elige Radon, MD  ?doxycycline (VIBRAMYCIN) 100 MG capsule Take 1 capsule (100 mg total) by mouth 2 (two) times daily. ?Patient not taking: Reported on 06/20/2021 05/02/21   Alveria Apley, PA-C  ?OLANZapine (ZYPREXA) 5 MG tablet Take 1 tablet (5 mg total) by mouth at bedtime. 06/27/21   Derwood Kaplan, MD  ?zolpidem (AMBIEN) 5 MG tablet Take 1 tablet (5 mg total) by mouth at bedtime as needed for sleep. 06/27/21   Derwood Kaplan, MD  ?   ? ?Allergies    ?Penicillins   ? ?Review of Systems   ?Review of Systems  ?Constitutional:   ?     Per HPI, otherwise negative  ?HENT:    ?     Per HPI, otherwise negative  ?Respiratory:    ?     Per HPI, otherwise negative  ?Cardiovascular:   ?     Per HPI, otherwise negative   ?Gastrointestinal:  Negative for vomiting.  ?Endocrine:  ?     Negative aside from HPI  ?Genitourinary:   ?     Neg aside from HPI   ?Musculoskeletal:   ?     Per HPI, otherwise negative  ?Skin: Negative.   ?Neurological:  Negative for syncope.  ? ?Physical Exam ?Updated Vital Signs ?BP (!) 161/102   Pulse 76   Temp 98.9 ?F (37.2 ?C) (Oral)   Resp 18   SpO2 98%  ?Physical Exam ?Vitals and nursing note reviewed.  ?Constitutional:   ?   General: She is not in acute distress. ?   Appearance: She is well-developed.  ?HENT:  ?   Head: Normocephalic and atraumatic.  ?Eyes:  ?   Conjunctiva/sclera: Conjunctivae normal.  ?Cardiovascular:  ?   Rate and Rhythm: Normal rate and regular rhythm.  ?Pulmonary:  ?   Effort: Pulmonary effort is normal. No respiratory distress.  ?   Breath sounds: Normal breath sounds. No stridor.  ?Abdominal:  ?   General: There is no distension.  ?Skin: ?   General: Skin is warm and dry.  ?Neurological:  ?   Mental Status: She is alert and oriented to person, place, and time.  ?   Cranial Nerves: No cranial nerve deficit.  ?Psychiatric:     ?  Mood and Affect: Mood normal.  ? ? ?ED Results / Procedures / Treatments   ?Labs ?(all labs ordered are listed, but only abnormal results are displayed) ?Labs Reviewed - No data to display ? ?EKG ?None ? ?Radiology ?DG Knee Complete 4 Views Left ? ?Result Date: 07/05/2021 ?CLINICAL DATA:  Left knee and hip pain following fall. EXAM: DG HIP (WITH OR WITHOUT PELVIS) 2-3V LEFT; LEFT KNEE - COMPLETE 4+ VIEW COMPARISON:  None. FINDINGS: There is no evidence of hip fracture or dislocation. Mild degenerative changes are present at the hips bilaterally and in the lower lumbar spine. No acute fracture or dislocation at the knee. There are moderate to severe degenerative changes at the knee and most pronounced in the medial compartment. No joint effusion. The soft tissues are within normal limits. IMPRESSION: No acute fracture or dislocation at the left hip or  knee. Electronically Signed   By: Thornell SartoriusLaura  Taylor M.D.   On: 07/05/2021 21:31  ? ?DG Hip Unilat W or Wo Pelvis 2-3 Views Left ? ?Result Date: 07/05/2021 ?CLINICAL DATA:  Left knee and hip pain following fall. EXAM: DG HIP (WITH OR WITHOUT PELVIS) 2-3V LEFT; LEFT KNEE - COMPLETE 4+ VIEW COMPARISON:  None. FINDINGS: There is no evidence of hip fracture or dislocation. Mild degenerative changes are present at the hips bilaterally and in the lower lumbar spine. No acute fracture or dislocation at the knee. There are moderate to severe degenerative changes at the knee and most pronounced in the medial compartment. No joint effusion. The soft tissues are within normal limits. IMPRESSION: No acute fracture or dislocation at the left hip or knee. Electronically Signed   By: Thornell SartoriusLaura  Taylor M.D.   On: 07/05/2021 21:31   ? ?Procedures ?Procedures  ? ? ?Medications Ordered in ED ?Medications - No data to display ? ?ED Course/ Medical Decision Making/ A&P ?This patient presents to the ED for concern of fall, this involves an extensive number of treatment options, and is a complaint that carries with it a high risk of complications and morbidity.  The differential diagnosis includes trauma, fracture soft tissue injury, nerve system disruption ? ? ?Co morbidities that complicate the patient evaluation ? ?Homelessness, lack of insight ? ?Social Determinants of Health: ? ?Homelessness ? ?Additional history obtained: ? ?Additional history and/or information obtained from chart review, social worker ?External records from outside source obtained and reviewed including as above, lengthy conversation about social worker her recent evaluations, lack of follow-through with resources provided, in spite of multiple resources. ? ? ?After the initial evaluation, orders, including: X-ray were initiated. ? ? ? ? ?On repeat evaluation of the patient stayed the same ? ? ? ?Imaging Studies ordered: ? ?I independently visualized and interpreted imaging  which showed no fractures ?I agree with the radiologist interpretation ? ?Consultations Obtained: ? ?I requested consultation with the social work,  and discussed lab and imaging findings as well as pertinent plan - they recommend: Lengthy conversation about options.  As above patient is exhausted multiple prior avenues for follow-up, has not followed through with provided resources including transportation to different sites.  Here no evidence for acute new phenomena, no fracture, no neurovascular compromise, patient provided new walker, discharged in stable condition. ? ? ? ?Final Clinical Impression(s) / ED Diagnoses ?Final diagnoses:  ?Pain  ? ?  ?Gerhard MunchLockwood, Tamilyn Lupien, MD ?07/05/21 2305 ? ?
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
## 2021-07-05 NOTE — ED Notes (Addendum)
Pt flagged this RN down from the waiting room. Pt requesting to be moved to a different area of the waiting room, then pt stated she didn't want to be seen anymore and asked to be placed outside so she could leave. Pt A/Ox4 noted when her request was made. Pt wheeled to front door. Triage RN notified.  ?

## 2021-07-05 NOTE — ED Triage Notes (Signed)
Pt BIB [facility] with complaints of full body stiffness. Pt requesting to come to [facility] because she wanted to be close to a bus station with an overhang.  ?
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
## 2021-07-05 NOTE — Discharge Instructions (Signed)
As discussed, your evaluation today has been largely reassuring.  But, it is important that you monitor your condition carefully, and do not hesitate to return to the ED if you develop new, or concerning changes in your condition. ? ?Otherwise, please follow-up with your physician for appropriate ongoing care. ? ?
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
## 2021-07-06 ENCOUNTER — Other Ambulatory Visit: Payer: Self-pay

## 2021-07-06 ENCOUNTER — Emergency Department (HOSPITAL_COMMUNITY): Payer: Medicare Other

## 2021-07-06 ENCOUNTER — Emergency Department (HOSPITAL_COMMUNITY)
Admission: EM | Admit: 2021-07-06 | Discharge: 2021-07-06 | Discharge status: Against Medical Advice | Payer: Medicare Other | Attending: Emergency Medicine | Admitting: Emergency Medicine

## 2021-07-06 ENCOUNTER — Emergency Department (EMERGENCY_DEPARTMENT_HOSPITAL)
Admission: EM | Admit: 2021-07-06 | Discharge: 2021-07-07 | Disposition: A | Discharge status: Home / Self Care | Payer: Medicare Other | Source: Home / Self Care | Attending: Emergency Medicine | Admitting: Emergency Medicine

## 2021-07-06 DIAGNOSIS — R262 Difficulty in walking, not elsewhere classified: Secondary | ICD-10-CM | POA: Diagnosis not present

## 2021-07-06 DIAGNOSIS — Z20822 Contact with and (suspected) exposure to covid-19: Secondary | ICD-10-CM | POA: Insufficient documentation

## 2021-07-06 DIAGNOSIS — R1013 Epigastric pain: Secondary | ICD-10-CM | POA: Diagnosis not present

## 2021-07-06 DIAGNOSIS — F22 Delusional disorders: Secondary | ICD-10-CM

## 2021-07-06 DIAGNOSIS — F251 Schizoaffective disorder, depressive type: Secondary | ICD-10-CM | POA: Insufficient documentation

## 2021-07-06 DIAGNOSIS — F259 Schizoaffective disorder, unspecified: Secondary | ICD-10-CM | POA: Diagnosis not present

## 2021-07-06 DIAGNOSIS — R519 Headache, unspecified: Secondary | ICD-10-CM | POA: Insufficient documentation

## 2021-07-06 DIAGNOSIS — R531 Weakness: Secondary | ICD-10-CM | POA: Diagnosis not present

## 2021-07-06 DIAGNOSIS — W19XXXA Unspecified fall, initial encounter: Secondary | ICD-10-CM

## 2021-07-06 DIAGNOSIS — W01198A Fall on same level from slipping, tripping and stumbling with subsequent striking against other object, initial encounter: Secondary | ICD-10-CM | POA: Diagnosis not present

## 2021-07-06 DIAGNOSIS — M25552 Pain in left hip: Secondary | ICD-10-CM | POA: Insufficient documentation

## 2021-07-06 DIAGNOSIS — Z5900 Homelessness unspecified: Secondary | ICD-10-CM

## 2021-07-06 LAB — BASIC METABOLIC PANEL
Anion gap: 9 (ref 5–15)
BUN: 18 mg/dL (ref 8–23)
CO2: 29 mmol/L (ref 22–32)
Calcium: 8.9 mg/dL (ref 8.9–10.3)
Chloride: 100 mmol/L (ref 98–111)
Creatinine, Ser: 0.51 mg/dL (ref 0.44–1.00)
GFR, Estimated: >60 mL/min (ref >60)
Glucose, Bld: 101 mg/dL — ABNORMAL HIGH (ref 70–99)
Potassium: 3.1 mmol/L — ABNORMAL LOW (ref 3.5–5.1)
Sodium: 138 mmol/L (ref 135–145)

## 2021-07-06 LAB — RESP PANEL BY RT-PCR (FLU A&B, COVID) ARPGX2
Influenza A by PCR: NEGATIVE
Influenza B by PCR: NEGATIVE
SARS Coronavirus 2 by RT PCR: NEGATIVE

## 2021-07-06 LAB — CBC WITH DIFFERENTIAL/PLATELET
Abs Immature Granulocytes: 0.02 10*3/uL (ref 0.00–0.07)
Basophils Absolute: 0.1 10*3/uL (ref 0.0–0.1)
Basophils Relative: 1 %
Eosinophils Absolute: 0.2 10*3/uL (ref 0.0–0.5)
Eosinophils Relative: 2 %
HCT: 41.3 % (ref 36.0–46.0)
Hemoglobin: 13.6 g/dL (ref 12.0–15.0)
Immature Granulocytes: 0 %
Lymphocytes Relative: 24 %
Lymphs Abs: 1.9 10*3/uL (ref 0.7–4.0)
MCH: 32 pg (ref 26.0–34.0)
MCHC: 32.9 g/dL (ref 30.0–36.0)
MCV: 97.2 fL (ref 80.0–100.0)
Monocytes Absolute: 0.8 10*3/uL (ref 0.1–1.0)
Monocytes Relative: 11 %
Neutro Abs: 4.8 10*3/uL (ref 1.7–7.7)
Neutrophils Relative %: 62 %
Platelets: 406 10*3/uL — ABNORMAL HIGH (ref 150–400)
RBC: 4.25 MIL/uL (ref 3.87–5.11)
RDW: 13.3 % (ref 11.5–15.5)
WBC: 7.8 10*3/uL (ref 4.0–10.5)
nRBC: 0 % (ref 0.0–0.2)

## 2021-07-06 MED ORDER — OLANZAPINE 5 MG PO TABS
5.0000 mg | ORAL_TABLET | Freq: Every day | ORAL | Status: DC
Start: 2021-07-06 — End: 2021-07-07
  Administered 2021-07-06: 5 mg via ORAL
  Filled 2021-07-06: qty 1

## 2021-07-06 NOTE — ED Notes (Signed)
PT needs social work consult due to homelessness and psychotic delusions. Signed out of [facility] earlier and BIB EMS and [facility] with IVC order in place. ?

## 2021-07-06 NOTE — ED Provider Notes (Signed)
?[facility] ?Provider Note ? ? ?CSN: [id number] ?Arrival date & time: 07/06/21  1604 ? ?  ? ?History ? ?Chief Complaint  ?Patient presents with  ? Fall  ? ? ?Angela Medina is a 71 y.o. female. ? ?The history is provided by the patient.  ?Fall ?This is a new problem. The current episode started 6 to 12 hours ago. The problem occurs every several days. Associated symptoms include headaches. Pertinent negatives include no chest pain, no abdominal pain and no shortness of breath. Nothing aggravates the symptoms. Nothing relieves the symptoms. She has tried nothing for the symptoms. The treatment provided no relief.  ? ?  ? ?Home Medications ?Prior to Admission medications   ?Medication Sig Start Date End Date Taking? Authorizing Provider  ?albuterol (VENTOLIN HFA) 108 (90 Base) MCG/ACT inhaler Inhale 2 puffs into the lungs every 6 (six) hours as needed for wheezing or shortness of breath. ?Patient not taking: Reported on 06/20/2021 04/02/21   Mitzi Hansen, MD  ?doxycycline (VIBRAMYCIN) 100 MG capsule Take 1 capsule (100 mg total) by mouth 2 (two) times daily. ?Patient not taking: Reported on 06/20/2021 05/02/21   Franchot Heidelberg, PA-C  ?OLANZapine (ZYPREXA) 5 MG tablet Take 1 tablet (5 mg total) by mouth at bedtime. 06/27/21   Varney Biles, MD  ?zolpidem (AMBIEN) 5 MG tablet Take 1 tablet (5 mg total) by mouth at bedtime as needed for sleep. 06/27/21   Varney Biles, MD  ?   ? ?Allergies    ?Penicillins   ? ?Review of Systems   ?Review of Systems  ?Respiratory:  Negative for shortness of breath.   ?Cardiovascular:  Negative for chest pain.  ?Gastrointestinal:  Negative for abdominal pain.  ?Neurological:  Positive for headaches.  ? ?Physical Exam ?Updated Vital Signs ?There were no vitals taken for this visit. ?Physical Exam ?Vitals and nursing note reviewed.  ?Constitutional:   ?   General: She is not in acute distress. ?   Appearance: She is well-developed. She is not  ill-appearing.  ?HENT:  ?   Head: Normocephalic and atraumatic.  ?   Nose: Nose normal.  ?   Mouth/Throat:  ?   Mouth: Mucous membranes are moist.  ?Eyes:  ?   Extraocular Movements: Extraocular movements intact.  ?   Conjunctiva/sclera: Conjunctivae normal.  ?   Pupils: Pupils are equal, round, and reactive to light.  ?Cardiovascular:  ?   Rate and Rhythm: Normal rate and regular rhythm.  ?   Pulses: Normal pulses.  ?   Heart sounds: Normal heart sounds. No murmur heard. ?Pulmonary:  ?   Effort: Pulmonary effort is normal. No respiratory distress.  ?   Breath sounds: Normal breath sounds.  ?Abdominal:  ?   Palpations: Abdomen is soft.  ?   Tenderness: There is no abdominal tenderness.  ?Musculoskeletal:     ?   General: Tenderness (left hip) present. No swelling.  ?   Cervical back: Normal range of motion and neck supple. No tenderness.  ?Skin: ?   General: Skin is warm and dry.  ?   Capillary Refill: Capillary refill takes less than 2 seconds.  ?Neurological:  ?   General: No focal deficit present.  ?   Mental Status: She is alert and oriented to person, place, and time.  ?   Cranial Nerves: No cranial nerve deficit.  ?   Sensory: No sensory deficit.  ?   Motor: No weakness.  ?Psychiatric:     ?  Mood and Affect: Mood normal.  ? ? ?ED Results / Procedures / Treatments   ?Labs ?(all labs ordered are listed, but only abnormal results are displayed) ?Labs Reviewed  ?CBC WITH DIFFERENTIAL/PLATELET  ?COMPREHENSIVE METABOLIC PANEL  ?ETHANOL  ?URINALYSIS, ROUTINE W REFLEX MICROSCOPIC  ? ? ?EKG ?None ? ?Radiology ?DG Chest 2 View ? ?Result Date: 07/06/2021 ?CLINICAL DATA:  Status post fall. EXAM: CHEST - 2 VIEW COMPARISON:  July 04, 2021 FINDINGS: The heart size and mediastinal contours are within normal limits. There is mild elevation of the left hemidiaphragm. Low lung volumes are noted. Both lungs are clear. Degenerative changes are seen involving the bilateral shoulders and thoracic spine. IMPRESSION: Low lung  volumes without active cardiopulmonary disease. Electronically Signed   By: Virgina Norfolk M.D.   On: 07/06/2021 17:30  ? ?CT Head Wo Contrast ? ?Result Date: 07/06/2021 ?CLINICAL DATA:  Pt BIB [facility] for fall after getting out of cab last night after being DC from here last night. PT told EMS that someone gave her coffee with LSD in it this morning so she has been on LSD all day. EXAM: CT HEAD WITHOUT CONTRAST CT CERVICAL SPINE WITHOUT CONTRAST TECHNIQUE: Multidetector CT imaging of the head and cervical spine was performed following the standard protocol without intravenous contrast. Multiplanar CT image reconstructions of the cervical spine were also generated. RADIATION DOSE REDUCTION: This exam was performed according to the departmental dose-optimization program which includes automated exposure control, adjustment of the mA and/or kV according to patient size and/or use of iterative reconstruction technique. COMPARISON:  06/28/2021 FINDINGS: CT HEAD FINDINGS Brain: No evidence of acute infarction, hemorrhage, hydrocephalus, extra-axial collection or mass lesion/mass effect. Stable right suprasellar aneurysm coil. Stable patchy areas of white matter hypoattenuation consistent with chronic microvascular ischemic change. Vascular: No hyperdense vessel or unexpected calcification. Skull: Normal. Negative for fracture or focal lesion. Sinuses/Orbits: Globes and orbits are unremarkable. Sinuses are clear. Other: None. CT CERVICAL SPINE FINDINGS Alignment: Kyphosis, apex at C5. Grade 1 anterolisthesis of C4 on C5. Grade 1 to grade 2 anterolisthesis of C3 and C4. These findings are stable and degenerative in origin. Skull base and vertebrae: No acute fracture. No primary bone lesion or focal pathologic process. Soft tissues and spinal canal: No prevertebral fluid or swelling. No visible canal hematoma. Disc levels: Moderate loss of disc height at C3-C4 and C5-C6. Mild to moderate loss of disc height at C4-C5 and  marked loss of disc height at C6-C7 and C7-T1. Significant facet degenerative changes most evident at C3-C4 C4-C5. No convincing disc herniation. No change. Upper chest: No acute or significant abnormality. Other: None. IMPRESSION: HEAD CT 1. No acute intracranial abnormalities. Stable appearance from the recent prior study. CERVICAL CT 1. No fracture or acute finding. Stable appearance from the recent cervical CT. Electronically Signed   By: Lajean Manes M.D.   On: 07/06/2021 17:22  ? ?CT Cervical Spine Wo Contrast ? ?Result Date: 07/06/2021 ?CLINICAL DATA:  Pt BIB [facility] for fall after getting out of cab last night after being DC from here last night. PT told EMS that someone gave her coffee with LSD in it this morning so she has been on LSD all day. EXAM: CT HEAD WITHOUT CONTRAST CT CERVICAL SPINE WITHOUT CONTRAST TECHNIQUE: Multidetector CT imaging of the head and cervical spine was performed following the standard protocol without intravenous contrast. Multiplanar CT image reconstructions of the cervical spine were also generated. RADIATION DOSE REDUCTION: This exam was performed according to the departmental  dose-optimization program which includes automated exposure control, adjustment of the mA and/or kV according to patient size and/or use of iterative reconstruction technique. COMPARISON:  06/28/2021 FINDINGS: CT HEAD FINDINGS Brain: No evidence of acute infarction, hemorrhage, hydrocephalus, extra-axial collection or mass lesion/mass effect. Stable right suprasellar aneurysm coil. Stable patchy areas of white matter hypoattenuation consistent with chronic microvascular ischemic change. Vascular: No hyperdense vessel or unexpected calcification. Skull: Normal. Negative for fracture or focal lesion. Sinuses/Orbits: Globes and orbits are unremarkable. Sinuses are clear. Other: None. CT CERVICAL SPINE FINDINGS Alignment: Kyphosis, apex at C5. Grade 1 anterolisthesis of C4 on C5. Grade 1 to grade 2  anterolisthesis of C3 and C4. These findings are stable and degenerative in origin. Skull base and vertebrae: No acute fracture. No primary bone lesion or focal pathologic process. Soft tissues and spinal canal: No prevertebral

## 2021-07-06 NOTE — ED Notes (Signed)
Pt decided to leave AMA.  Dr. Lockie Molauratolo notified. SW provided cab voucher.  PT moved to lobby and [employer] called.  ?

## 2021-07-06 NOTE — Social Work (Signed)
[facility] case, [name] @ [phone]. [facility]. ?

## 2021-07-06 NOTE — ED Provider Notes (Signed)
?[facility] DEPT ?Provider Note ? ? ?CSN: [id number] ?Arrival date & time: 07/06/21  2032 ? ?  ? ?History ? ?No chief complaint on file. ? ? ?Angela Medina is a 71 y.o. female. ? ?HPI ?Patient presents under involuntary commitment by local police staff.  History is obtained by police officer, and the patient.  I have seen and evaluated the patient 5 times within the past 3 weeks.  Patient is known to be having difficulty with her transient status, but has multiple times been seen, evaluated by social work, behavioral health, has been discharged with transportation to a shelter.  She is noted to have diverted several times from her children on 2 different locations.  Today she was found without her walker, which was provided yesterday to her by social work after I conversed about her with them.  Notably patient was also seen earlier today after insensible fall.  Per police there is concern for the patient being confused, disoriented, not being able to take care of herself.  The patient herself complains of diffuse weakness, but is oriented appropriately. ? ?Please also note delusional behavior, with possible hallucination.  This was not present during my evaluation. ? ?My last evaluation the patient was 2 days ago.  At that point she was provided a walker on discharge.  Of the third walker she received this week.  Today she arrives with none of these 3 walkers with her. ?  ? ?Home Medications ?Prior to Admission medications   ?Medication Sig Start Date End Date Taking? Authorizing Provider  ?albuterol (VENTOLIN HFA) 108 (90 Base) MCG/ACT inhaler Inhale 2 puffs into the lungs every 6 (six) hours as needed for wheezing or shortness of breath. ?Patient not taking: Reported on 06/20/2021 04/02/21   Mitzi Hansen, MD  ?doxycycline (VIBRAMYCIN) 100 MG capsule Take 1 capsule (100 mg total) by mouth 2 (two) times daily. ?Patient not taking: Reported on 06/20/2021 05/02/21   Franchot Heidelberg,  PA-C  ?OLANZapine (ZYPREXA) 5 MG tablet Take 1 tablet (5 mg total) by mouth at bedtime. 06/27/21   Varney Biles, MD  ?zolpidem (AMBIEN) 5 MG tablet Take 1 tablet (5 mg total) by mouth at bedtime as needed for sleep. 06/27/21   Varney Biles, MD  ?   ? ?Allergies    ?Penicillins   ? ?Review of Systems   ?Review of Systems  ?All other systems reviewed and are negative. ? ?Physical Exam ?Updated Vital Signs ?BP (!) 145/96   Pulse 73   Temp 98.1 ?F (36.7 ?C)   Resp 18   Ht 5\' 1"  (1.549 m)   Wt 63.5 kg   SpO2 94%   BMI 26.45 kg/m?  ?Physical Exam ?Vitals and nursing note reviewed.  ?Constitutional:   ?   General: She is not in acute distress. ?   Appearance: She is well-developed. She is ill-appearing. She is not toxic-appearing or diaphoretic.  ?HENT:  ?   Head: Normocephalic and atraumatic.  ?Eyes:  ?   Conjunctiva/sclera: Conjunctivae normal.  ?Cardiovascular:  ?   Rate and Rhythm: Normal rate and regular rhythm.  ?Pulmonary:  ?   Effort: Pulmonary effort is normal. No respiratory distress.  ?   Breath sounds: Normal breath sounds. No stridor.  ?Abdominal:  ?   General: There is no distension.  ?Skin: ?   General: Skin is warm and dry.  ?Neurological:  ?   Mental Status: She is alert and oriented to person, place, and time.  ?  Cranial Nerves: No cranial nerve deficit.  ?   Comments: Patient was extremity spontaneously, though she states that she has weakness, she is moving both arms in an animated fashion.  ?Psychiatric:  ?   Comments: Slow to respond to questions but answers them appropriately, pleasantly interactive, but no insight into her recent history, or today's evaluation.  ? ? ?ED Results / Procedures / Treatments   ?Labs ?(all labs ordered are listed, but only abnormal results are displayed) ?Labs Reviewed  ?BASIC METABOLIC PANEL  ?CBC WITH DIFFERENTIAL/PLATELET  ? ? ?EKG ?None ? ?Radiology ?DG Chest 2 View ? ?Result Date: 07/06/2021 ?CLINICAL DATA:  Status post fall. EXAM: CHEST - 2 VIEW  COMPARISON:  July 04, 2021 FINDINGS: The heart size and mediastinal contours are within normal limits. There is mild elevation of the left hemidiaphragm. Low lung volumes are noted. Both lungs are clear. Degenerative changes are seen involving the bilateral shoulders and thoracic spine. IMPRESSION: Low lung volumes without active cardiopulmonary disease. Electronically Signed   By: Virgina Norfolk M.D.   On: 07/06/2021 17:30  ? ?CT Head Wo Contrast ? ?Result Date: 07/06/2021 ?CLINICAL DATA:  Pt BIB [facility] for fall after getting out of cab last night after being DC from here last night. PT told EMS that someone gave her coffee with LSD in it this morning so she has been on LSD all day. EXAM: CT HEAD WITHOUT CONTRAST CT CERVICAL SPINE WITHOUT CONTRAST TECHNIQUE: Multidetector CT imaging of the head and cervical spine was performed following the standard protocol without intravenous contrast. Multiplanar CT image reconstructions of the cervical spine were also generated. RADIATION DOSE REDUCTION: This exam was performed according to the departmental dose-optimization program which includes automated exposure control, adjustment of the mA and/or kV according to patient size and/or use of iterative reconstruction technique. COMPARISON:  06/28/2021 FINDINGS: CT HEAD FINDINGS Brain: No evidence of acute infarction, hemorrhage, hydrocephalus, extra-axial collection or mass lesion/mass effect. Stable right suprasellar aneurysm coil. Stable patchy areas of white matter hypoattenuation consistent with chronic microvascular ischemic change. Vascular: No hyperdense vessel or unexpected calcification. Skull: Normal. Negative for fracture or focal lesion. Sinuses/Orbits: Globes and orbits are unremarkable. Sinuses are clear. Other: None. CT CERVICAL SPINE FINDINGS Alignment: Kyphosis, apex at C5. Grade 1 anterolisthesis of C4 on C5. Grade 1 to grade 2 anterolisthesis of C3 and C4. These findings are stable and degenerative in  origin. Skull base and vertebrae: No acute fracture. No primary bone lesion or focal pathologic process. Soft tissues and spinal canal: No prevertebral fluid or swelling. No visible canal hematoma. Disc levels: Moderate loss of disc height at C3-C4 and C5-C6. Mild to moderate loss of disc height at C4-C5 and marked loss of disc height at C6-C7 and C7-T1. Significant facet degenerative changes most evident at C3-C4 C4-C5. No convincing disc herniation. No change. Upper chest: No acute or significant abnormality. Other: None. IMPRESSION: HEAD CT 1. No acute intracranial abnormalities. Stable appearance from the recent prior study. CERVICAL CT 1. No fracture or acute finding. Stable appearance from the recent cervical CT. Electronically Signed   By: Lajean Manes M.D.   On: 07/06/2021 17:22  ? ?CT Cervical Spine Wo Contrast ? ?Result Date: 07/06/2021 ?CLINICAL DATA:  Pt BIB [facility] for fall after getting out of cab last night after being DC from here last night. PT told EMS that someone gave her coffee with LSD in it this morning so she has been on LSD all day. EXAM: CT HEAD WITHOUT CONTRAST  CT CERVICAL SPINE WITHOUT CONTRAST TECHNIQUE: Multidetector CT imaging of the head and cervical spine was performed following the standard protocol without intravenous contrast. Multiplanar CT image reconstructions of the cervical spine were also generated. RADIATION DOSE REDUCTION: This exam was performed according to the departmental dose-optimization program which includes automated exposure control, adjustment of the mA and/or kV according to patient size and/or use of iterative reconstruction technique. COMPARISON:  06/28/2021 FINDINGS: CT HEAD FINDINGS Brain: No evidence of acute infarction, hemorrhage, hydrocephalus, extra-axial collection or mass lesion/mass effect. Stable right suprasellar aneurysm coil. Stable patchy areas of white matter hypoattenuation consistent with chronic microvascular ischemic change. Vascular: No  hyperdense vessel or unexpected calcification. Skull: Normal. Negative for fracture or focal lesion. Sinuses/Orbits: Globes and orbits are unremarkable. Sinuses are clear. Other: None. CT CERVICAL SPINE FINDINGS Alignment: K

## 2021-07-06 NOTE — Social Work (Signed)
[facility] case worker [name] @ [phone] to update about case. Left message informing [name] that Pt left [facility] and was brought to [facility] under [facility]. ?

## 2021-07-06 NOTE — Progress Notes (Signed)
Pt stopped CSW and asked for a taxi voucher for d/c. CSW explained that EDP was still assessing. Pt states that she does not wish further assessment and wishes to d/c.  ?RN notified. ?

## 2021-07-06 NOTE — ED Triage Notes (Signed)
Pt BIB [facility] for fall after getting out of cab last night after being DC from here last night. PT  told EMS that someone gave her coffee with LSD in it this morning so she has been on LSD all day.   ?

## 2021-07-07 ENCOUNTER — Other Ambulatory Visit: Payer: Self-pay

## 2021-07-07 ENCOUNTER — Emergency Department (HOSPITAL_COMMUNITY)
Admission: EM | Admit: 2021-07-07 | Discharge: 2021-07-08 | Disposition: A | Discharge status: Home / Self Care | Payer: Medicare Other | Attending: Emergency Medicine | Admitting: Emergency Medicine

## 2021-07-07 DIAGNOSIS — R7989 Other specified abnormal findings of blood chemistry: Secondary | ICD-10-CM | POA: Diagnosis not present

## 2021-07-07 DIAGNOSIS — F259 Schizoaffective disorder, unspecified: Secondary | ICD-10-CM

## 2021-07-07 DIAGNOSIS — R531 Weakness: Secondary | ICD-10-CM | POA: Diagnosis not present

## 2021-07-07 DIAGNOSIS — Z5900 Homelessness unspecified: Secondary | ICD-10-CM | POA: Diagnosis present

## 2021-07-07 DIAGNOSIS — F22 Delusional disorders: Secondary | ICD-10-CM | POA: Insufficient documentation

## 2021-07-07 DIAGNOSIS — M25552 Pain in left hip: Secondary | ICD-10-CM | POA: Diagnosis not present

## 2021-07-07 DIAGNOSIS — R1013 Epigastric pain: Secondary | ICD-10-CM | POA: Diagnosis not present

## 2021-07-07 MED ORDER — OLANZAPINE 5 MG PO TABS: 5.0000 mg | ORAL_TABLET | Freq: Every day | ORAL | Status: DC

## 2021-07-07 MED ORDER — ACETAMINOPHEN 325 MG PO TABS
ORAL_TABLET | ORAL | Status: AC
Start: 2021-07-07 — End: 2021-07-07
  Administered 2021-07-07: 650 mg
  Filled 2021-07-07: qty 2

## 2021-07-07 NOTE — ED Notes (Signed)
PT will remain in the ED being observed, and consult with Psych in the morning. ?
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
## 2021-07-07 NOTE — ED Provider Notes (Signed)
Emergency Medicine Observation Re-evaluation Note ? ?Angela Medina is a 71 y.o. female, seen on rounds today.  Pt initially presented to the ED for complaints of delusions.  Currently, the patient is resting comfortably after eating breakfast. ? ?Physical Exam  ?BP 90/64   Pulse 62   Temp 98.1 ?F (36.7 ?C)   Resp 16   Ht 5\' 1"  (1.549 m)   Wt 63.5 kg   SpO2 90%   BMI 26.45 kg/m?  ?Physical Exam ?General: Nontoxic appearance ?Cardiac: Normal heart rate ?Lungs: Normal respiratory ?Psych: No internal responsiveness at this time.  No overt hallucinations visualized or stated. ? ?ED Course / MDM  ?EKG:  ? ?I have reviewed the labs performed to date as well as medications administered while in observation.  Recent changes in the last 24 hours include she has been calm during the period of observation and medical clearance.  TTS saw her this morning, their recommendation is documented in the chart.  She has been cleared by psychiatry.  APS has been contacted.  There are no impediments to discharge.  I have reversed her IVC petition with first examination paperwork. ? ?Plan  ?Current plan is for  discharge.  Note that the patient is homeless. ?Angela Medina is not under involuntary commitment. ?  ? ?  ?Mancel Bale, MD ?07/07/21 1422 ? ?

## 2021-07-07 NOTE — Progress Notes (Signed)
CSW contacted patients APS worker, Feliz Beam [phone] and left a message informing her patient is being psych cleared and discharged.  ?
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
## 2021-07-07 NOTE — Discharge Instructions (Signed)
Substance Abuse Treatment Programs ° °Intensive Outpatient Programs °[facility]     °[address]      °[place], [place]                   °[phone]      ° °[facility] °[address] °[place], [place] °[phone] ° °[facility]     °(Inpatient and outpatient)     °[address]           °[phone]   ° °[facility] °336-288-1484 (Suboxone and Methadone) ° °[address]      °[place], [place] [address]      °336-882-2125      ° °[address] °[place], [place] °852-3033 ° °[facility] (Outpatient/Inpatient, Chemical)    °(insurance only) [phone]      °       °[facility] (Groups & Residential) °[place], [place] °336-389-1413 ° °   °[facility]     °[address]     °[place], [place]      °336-389-1413      ° °[facility] (for caregivers and family) °[address] °[place], [place] °336-299-4655 ° ° ° ° ° °Residential Treatment Programs °[facility]      °[address], [place], [place] [address]  °([phone]      ° °[facility] °[address] [place], [place] [address] °[phone] ° °[facility]        °336-248-8914      ° °[facility] °1-800-659-3381 ° °[facility]             °[address]                                         °[place], [place]                                                °877-615-2722 or 336-784-9470                              ° °[facility] [place] °[address] °[place], [address] °1.877.941.8954 ° °[facility]    °[address]      °[place], [place]     °[phone]      ° °[facility] °[address] °[place], [place] °336-285-9073 ° °[facility]   °[address]     °[place], [place] [address]     °336-899-1550      °Admissions: 8am-3pm M-F ° °[facility] °[address] °,  [place] °336-227-7417 ° °[facility]: Residential Program (90 Days)   °[place], [place]      °336-725-8389 or 800-758-6077    ° °[facility] °[place], [place] °(Walk in Hours over the weekend or by referral) ° °[facility] °[address], [place],  [address] °(336) [phone] ° °Crisis Mobile: [facility]:  1-877-626-1772 (for crisis response 24 hours a day) °[facility] Hotline:      1-800-256-2452 °Outpatient Psychiatry and Counseling ° °[facility]: Mobile Crisis   Management 24 hours:  1-877-626-1772 ° °[facility] sliding scale fee and walk in schedule: M-F 8am-12pm/1pm-3pm °[address]  °[place], [place] [address] °336-387-6161 ° °[facility] °[address] °[place], [place] [address] °336-703-9650 ° °[facility] ([facility] known [facility])- new patient walk-in appointments available Monday - Friday 8am -3pm.          °[address] °[place], [place] [address] °336-676-6840 or crisis line- 336-676-6905 ° °[facility] Intensive Outpatient Therapy Program °[address] °[place], [place] [address] °336-832-9804 ° °[facility]                  °Crisis Services      °336.641.4993      °[address]     °[place], [place] [address]                ° °[facility]   °[facility] °[phone] °[address] °[place], [place] [address] ° ° °[facility]          °[address],  °[place], [place] [address]       °(336) [phone] ° °[facility] °[address] °[place], [place] [address] °336-292-1510 ° °[facility]    °[address]    °[place], [place] [address]     °336-632-3505      ° °Parish McKinney, MD     °[address]     °[place] [address]     °336-282-1251     °  °[facility] °[address] °[place] [address] ° °[facility]     °[address]     °[place], [place]      °336-542-2076      ° °[facility] °Shamsher Ahluwalia, MD °[address] °[place], [place] [address] °336-420-9558 ° °[facility]     °[address]     °[place], [place] [address]     °336-274-1237      ° °[facility] °[address] °[place], [place] [address] °336-854-4450 °Resources for Temporary Residential Assistance/Crisis Centers ° °DAY CENTERS °[facility] °M-F 8am-3pm   °[address] [place], [place] [address]   336-332-0824 °Services include: laundry, barbering, support groups, case management, phone  & computer access, showers, AA/NA mtgs, mental health/substance abuse nurse, job skills class, disability information, VA assistance, spiritual classes, etc.  ° °HOMELESS SHELTERS ° °[facility]     °[facility]   °[address], [place]     °[phone]       °       °[facility] (women and children)       °[address] °, [place] [address] °336-275-0820 °[email or web address] for application and process °Application Required ° °[facility]   °[address]    °[place] [address]     °[phone]       °             °[facility] °[address] °, [place] [address] °336.273.5572 °336-235-0363(schedule application appt.) °Application Required ° °[facility] (women only)    °[address]     °[place],  [address]     °336-884-1039      °  Intake starts 6pm daily °Need valid ID, SSC, & Police report °[facility] [place] °[address] °[place], [place] °336-881-5420 °Application Required ° °[facility] (men only)     °[address]      °[place], [place]     °[phone]      ° °[facility] °(Pregnant women only) °[address] °[place], [place] °336-275-0206 ° °[facility]      °[address]      °[place], [place] [address]     °[phone]      °       °[facility] °[address] °[place], [place] °336-723-1848 °90 day commitment/SA/Application process ° °[facility](men only)     °[address]     °[place], [place]     °336-748-1962       °Check-in at 7pm     °       °[facility] °[address] °[place], [place] [address] °336-248-6684 °Men/Women/Women and Children must be there by 7 pm ° °[facility] °[place],  °336-722-8721                ° °

## 2021-07-07 NOTE — ED Notes (Signed)
Per Carollee HerterShannon from SW, patient is to be discharged on her own and does not require legal guardianship at this time.  ?

## 2021-07-07 NOTE — Progress Notes (Signed)
CSW spoke with [facility] leadership in regards to patient being psych cleared by the psychiatry department. Patient does not have a legal guardian and legally CSW cannot hold patient in the ED. Patient has an assigned APS worker [name] with [facility]. CSW is unable to place patient in a SNF due to patient being homeless and having no disposition after SNF placement. CSW is also unable to place patient in a long term care facility due to patient not having Medicaid. CSW helped patient complete a medicaid screening with [employer] two weeks ago. CSW was informed by [employer] that patient does not qualify for full medicaid which would cover long term care placement. Patient would have to pay out of pocket for LTC. Patient also receives $1400 a month in SSI and also has a $6,000 check that has not been cashed due to patient not having an updated ID. CSW has spoken to patients sister [name] previous admissions and that patient will not allow her sister to help her obtain an ID. APS was contacted on March 14th due to these above concerns. APS has not communicated with TOC to determine their assessment with patient earlier this week. TOC leadership advised that patient will need to be discharged due to the ED being unable to place patient in a facility. Messages were left with patients APS worker yesterday 07/06/21 and today 07/07/21. CSW also left a message with the on-call APS line to inform them of patient being discharge and is they needed to conduct any follow-up visits,  ?

## 2021-07-07 NOTE — Progress Notes (Signed)
[facility] APS contacted CSW back and stated they will staff with the on-call supervisor to verify if anything is needed over the weekend. ?
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
## 2021-07-07 NOTE — Care Management (Signed)
[facility] ED.and was psychiatrically cleared. She cannot be pl;aced in a long term care facility due to no Medicaid, ( [facility])  ? ?

## 2021-07-07 NOTE — ED Triage Notes (Signed)
Pt states she doesn't have a walker anymore, and wants to be placed somewhere. States she cant get to shelters.  ?

## 2021-07-07 NOTE — Discharge Instructions (Signed)
Take your medicine as prescribed.  Follow-up with your medical doctor and psychiatric care providers for ongoing care. ?

## 2021-07-08 ENCOUNTER — Emergency Department (HOSPITAL_COMMUNITY)
Admission: EM | Admit: 2021-07-08 | Discharge: 2021-07-08 | Disposition: A | Discharge status: Home / Self Care | Payer: Medicare Other | Source: Home / Self Care | Attending: Emergency Medicine | Admitting: Emergency Medicine

## 2021-07-08 ENCOUNTER — Emergency Department (HOSPITAL_COMMUNITY)
Admission: EM | Admit: 2021-07-08 | Discharge: 2021-07-08 | Disposition: A | Discharge status: Home / Self Care | Payer: Medicare Other | Source: Home / Self Care | Attending: Student | Admitting: Student

## 2021-07-08 ENCOUNTER — Emergency Department (HOSPITAL_COMMUNITY)
Admission: EM | Admit: 2021-07-08 | Discharge: 2021-07-09 | Disposition: A | Discharge status: Home / Self Care | Payer: Medicare Other | Source: Home / Self Care | Attending: Emergency Medicine | Admitting: Emergency Medicine

## 2021-07-08 ENCOUNTER — Encounter (HOSPITAL_COMMUNITY): Payer: Self-pay | Admitting: Emergency Medicine

## 2021-07-08 ENCOUNTER — Other Ambulatory Visit: Payer: Self-pay

## 2021-07-08 DIAGNOSIS — X509XXA Other and unspecified overexertion or strenuous movements or postures, initial encounter: Secondary | ICD-10-CM | POA: Insufficient documentation

## 2021-07-08 DIAGNOSIS — R1013 Epigastric pain: Secondary | ICD-10-CM | POA: Insufficient documentation

## 2021-07-08 DIAGNOSIS — R531 Weakness: Secondary | ICD-10-CM | POA: Insufficient documentation

## 2021-07-08 DIAGNOSIS — M25552 Pain in left hip: Secondary | ICD-10-CM | POA: Insufficient documentation

## 2021-07-08 DIAGNOSIS — F22 Delusional disorders: Secondary | ICD-10-CM | POA: Insufficient documentation

## 2021-07-08 DIAGNOSIS — Z5900 Homelessness unspecified: Secondary | ICD-10-CM | POA: Insufficient documentation

## 2021-07-08 DIAGNOSIS — R7989 Other specified abnormal findings of blood chemistry: Secondary | ICD-10-CM | POA: Insufficient documentation

## 2021-07-08 LAB — COMPREHENSIVE METABOLIC PANEL
ALT: 18 U/L (ref 0–44)
AST: 34 U/L (ref 15–41)
Albumin: 3 g/dL — ABNORMAL LOW (ref 3.5–5.0)
Alkaline Phosphatase: 75 U/L (ref 38–126)
Anion gap: 10 (ref 5–15)
BUN: 28 mg/dL — ABNORMAL HIGH (ref 8–23)
CO2: 29 mmol/L (ref 22–32)
Calcium: 8.3 mg/dL — ABNORMAL LOW (ref 8.9–10.3)
Chloride: 97 mmol/L — ABNORMAL LOW (ref 98–111)
Creatinine, Ser: 0.81 mg/dL (ref 0.44–1.00)
GFR, Estimated: >60 mL/min (ref >60)
Glucose, Bld: 112 mg/dL — ABNORMAL HIGH (ref 70–99)
Potassium: 3.7 mmol/L (ref 3.5–5.1)
Sodium: 136 mmol/L (ref 135–145)
Total Bilirubin: 0.4 mg/dL (ref 0.3–1.2)
Total Protein: 6.2 g/dL — ABNORMAL LOW (ref 6.5–8.1)

## 2021-07-08 LAB — HEPATIC FUNCTION PANEL
ALT: 18 U/L (ref 0–44)
AST: 34 U/L (ref 15–41)
Albumin: 3.1 g/dL — ABNORMAL LOW (ref 3.5–5.0)
Alkaline Phosphatase: 79 U/L (ref 38–126)
Bilirubin, Direct: <0.1 mg/dL (ref 0.0–0.2)
Total Bilirubin: 0.7 mg/dL (ref 0.3–1.2)
Total Protein: 6.4 g/dL — ABNORMAL LOW (ref 6.5–8.1)

## 2021-07-08 LAB — CBC WITH DIFFERENTIAL/PLATELET
Abs Immature Granulocytes: 0.05 10*3/uL (ref 0.00–0.07)
Basophils Absolute: 0 10*3/uL (ref 0.0–0.1)
Basophils Relative: 0 %
Eosinophils Absolute: 0 10*3/uL (ref 0.0–0.5)
Eosinophils Relative: 0 %
HCT: 40.1 % (ref 36.0–46.0)
Hemoglobin: 12.9 g/dL (ref 12.0–15.0)
Immature Granulocytes: 1 %
Lymphocytes Relative: 6 %
Lymphs Abs: 0.4 10*3/uL — ABNORMAL LOW (ref 0.7–4.0)
MCH: 31.7 pg (ref 26.0–34.0)
MCHC: 32.2 g/dL (ref 30.0–36.0)
MCV: 98.5 fL (ref 80.0–100.0)
Monocytes Absolute: 0.7 10*3/uL (ref 0.1–1.0)
Monocytes Relative: 10 %
Neutro Abs: 5.7 10*3/uL (ref 1.7–7.7)
Neutrophils Relative %: 83 %
Platelets: 384 10*3/uL (ref 150–400)
RBC: 4.07 MIL/uL (ref 3.87–5.11)
RDW: 13.5 % (ref 11.5–15.5)
WBC: 6.8 10*3/uL (ref 4.0–10.5)
nRBC: 0 % (ref 0.0–0.2)

## 2021-07-08 LAB — LIPASE, BLOOD: Lipase: 23 U/L (ref 11–51)

## 2021-07-08 MED ORDER — ALUM & MAG HYDROXIDE-SIMETH 200-200-20 MG/5ML PO SUSP
30.0000 mL | Freq: Once | ORAL | Status: AC
  Administered 2021-07-08: 30 mL via ORAL
  Filled 2021-07-08: qty 30

## 2021-07-08 MED ORDER — LIDOCAINE VISCOUS HCL 2 % MT SOLN
15.0000 mL | Freq: Once | OROMUCOSAL | Status: AC
  Administered 2021-07-08: 15 mL via ORAL
  Filled 2021-07-08: qty 15

## 2021-07-08 MED ORDER — ONDANSETRON 4 MG PO TBDP
8.0000 mg | ORAL_TABLET | Freq: Once | ORAL | Status: AC
  Administered 2021-07-08: 8 mg via ORAL
  Filled 2021-07-08: qty 2

## 2021-07-08 NOTE — ED Provider Notes (Signed)
?[facility] ?Provider Note ? ? ?CSN: [id number] ?Arrival date & time: 07/08/21  [id number] ? ?  ? ?History ?PMH homelessness, schizoaffective disorder, depression ?No chief complaint on file. ? ? ?Angela CombesRebecca S Medina is a 71 y.o. female.  Patient presents the emergency department with a chief complaint of regurgitation.  She states every time she takes a drink, she has a regurgitation feeling and ends up throwing up whenever she is ate or drink.  She has associated epigastric abdominal pain.  She says the symptoms started 2 days ago.  She denies any constipation, diarrhea, fevers, chills, chest pain, shortness of breath, dysuria, hematuria.  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  She also is homeless. [facility]  This patient was recently seen yesterday for separate symptoms. ? ?HPI ? ?  ? ?Home Medications ?Prior to Admission medications   ?Medication Sig Start Date End Date Taking? Authorizing Provider  ?albuterol (VENTOLIN HFA) 108 (90 Base) MCG/ACT inhaler Inhale 2 puffs into the lungs every 6 (six) hours as needed for wheezing or shortness of breath. ?Patient not taking: Reported on 06/20/2021 04/02/21   Elige Radonhristian, Rylee, MD  ?doxycycline (VIBRAMYCIN) 100 MG capsule Take 1 capsule (100 mg total) by mouth 2 (two) times daily. ?Patient not taking: Reported on 06/20/2021 05/02/21   Alveria Apleyaccavale, Sophia, PA-C  ?OLANZapine (ZYPREXA) 5 MG tablet Take 1 tablet (5 mg total) by mouth at bedtime. ?Patient not taking: Reported on 07/07/2021 06/27/21   Derwood KaplanNanavati, Ankit, MD  ?zolpidem (AMBIEN) 5 MG tablet Take 1 tablet (5 mg total) by mouth at bedtime as needed for sleep. ?Patient not taking: Reported on 07/07/2021 06/27/21   Derwood KaplanNanavati, Ankit, MD  ?   ? ?Allergies    ?Methotrexate derivatives and Penicillins   ? ?Review of Systems   ?Review of Systems  ?Gastrointestinal:  Positive for  abdominal pain, nausea and vomiting.  ?All other systems reviewed and are negative. ? ?Physical Exam ?Updated Vital Signs ?BP 121/81 (BP Location: Right Arm)   Pulse 77   Temp 99.5 ?F (37.5 ?C) (Oral)   Resp 20   SpO2 96%  ?Physical Exam ?Vitals and nursing note reviewed.  ?Constitutional:   ?   General: She is not in acute distress. ?   Appearance: Normal appearance. She is ill-appearing. She is not toxic-appearing or diaphoretic.  ?   Comments: Appears chronically ill  ?HENT:  ?   Head: Normocephalic and atraumatic.  ?   Nose: No nasal deformity.  ?   Mouth/Throat:  ?   Lips: Pink. No lesions.  ?   Mouth: Mucous membranes are moist. No injury, lacerations, oral lesions or angioedema.  ?   Pharynx: Oropharynx is clear. Uvula midline. No pharyngeal swelling, oropharyngeal exudate, posterior oropharyngeal erythema or uvula swelling.  ?Eyes:  ?   General: Gaze aligned appropriately. No scleral icterus.    ?   Right eye: No discharge.     ?   Left eye: No discharge.  ?   Conjunctiva/sclera: Conjunctivae normal.  ?   Right eye: Right conjunctiva is not injected. No exudate or hemorrhage. ?   Left eye: Left conjunctiva is not injected. No exudate or hemorrhage. ?   Comments: No scleral icterus  ?Cardiovascular:  ?   Rate and Rhythm: Normal rate and regular rhythm.  ?   Pulses: Normal pulses.     ?  Radial pulses are 2+ on the right side and 2+ on the left side.  ?     Dorsalis pedis pulses are 2+ on the right side and 2+ on the left side.  ?   Heart sounds: Normal heart sounds, S1 normal and S2 normal. Heart sounds not distant. No murmur heard. ?  No friction rub. No gallop. No S3 or S4 sounds.  ?Pulmonary:  ?   Effort: Pulmonary effort is normal. No accessory muscle usage or respiratory distress.  ?   Breath sounds: Normal breath sounds. No stridor. No wheezing, rhonchi or rales.  ?Chest:  ?   Chest wall: No tenderness.  ?Abdominal:  ?   General: Abdomen is flat. Bowel sounds are normal. There is no distension.   ?   Palpations: Abdomen is soft. There is no mass or pulsatile mass.  ?   Tenderness: There is abdominal tenderness. There is no right CVA tenderness, left CVA tenderness, guarding or rebound.  ?   Comments: Generalized tenderness to abdomen. Not peritonitic. More prominent in epigastric region. Murphy's is negative.  ?Musculoskeletal:  ?   Right lower leg: No edema.  ?   Left lower leg: No edema.  ?Skin: ?   General: Skin is warm and dry.  ?   Coloration: Skin is not jaundiced or pale.  ?   Findings: No bruising, erythema, lesion or rash.  ?Neurological:  ?   General: No focal deficit present.  ?   Mental Status: She is alert and oriented to person, place, and time.  ?   GCS: GCS eye subscore is 4. GCS verbal subscore is 5. GCS motor subscore is 6.  ?Psychiatric:     ?   Mood and Affect: Mood normal.     ?   Behavior: Behavior normal. Behavior is cooperative.  ? ? ?ED Results / Procedures / Treatments   ?Labs ?(all labs ordered are listed, but only abnormal results are displayed) ?Labs Reviewed  ?COMPREHENSIVE METABOLIC PANEL - Abnormal; Notable for the following components:  ?    Result Value  ? Chloride 97 (*)   ? Glucose, Bld 112 (*)   ? BUN 28 (*)   ? Calcium 8.3 (*)   ? Total Protein 6.2 (*)   ? Albumin 3.0 (*)   ? All other components within normal limits  ?CBC WITH DIFFERENTIAL/PLATELET - Abnormal; Notable for the following components:  ? Lymphs Abs 0.4 (*)   ? All other components within normal limits  ?LIPASE, BLOOD  ?URINALYSIS, ROUTINE W REFLEX MICROSCOPIC  ? ? ?EKG ?None ? ?Radiology ?No results found. ? ?Procedures ?Procedures  ? ? ?Medications Ordered in ED ?Medications  ?alum & mag hydroxide-simeth (MAALOX/MYLANTA) 200-200-20 MG/5ML suspension 30 mL (30 mLs Oral Given 07/08/21 1133)  ?  And  ?lidocaine (XYLOCAINE) 2 % viscous mouth solution 15 mL (15 mLs Oral Given 07/08/21 1133)  ?ondansetron (ZOFRAN-ODT) disintegrating tablet 8 mg (8 mg Oral Given 07/08/21 1123)  ? ? ?ED Course/ Medical Decision  Making/ A&P ?  ?                        ?Medical Decision Making ?Amount and/or Complexity of Data Reviewed ?Labs: ordered. ? ?Risk ?OTC drugs. ?Prescription drug management. ? ? ? ?MDM  ?This is a 71 y.o. female who presents to the ED with regurgitation symptoms ?The differential of this patient includes but is not limited to Gallbladder etiology (cholecystitis, cholangitis, biliary colic), GERD, Pancreatitis,  PUD. ? ?My Impression, Plan, and ED Course: This patient is well-known to our ED and recurrently comes in with various complaints and problems with finding shelter.  She was last seen yesterday for placement concerns and per chart review, does not look like she brought up the abdominal pain, nausea, and vomiting that she is having today.  I reviewed previous labs and her most recent liver enzymes were done 10 days ago and were normal.  I think it is unlikely that these will have changed, but I will repeat them as well as adding on a lipase, CBC, and urine.  I do not think that she requires a CT scan as she is not peritonitic and has not shown any evidence of vomiting while in the ED.  She is reportedly having normal bowel movements so obstruction is less likely.  Social work has been involved during the care of this patient in order to find appropriate shelters available to her.  ? ?I personally ordered, reviewed, and interpreted all laboratory work and imaging and agree with radiologist interpretation. Results interpreted below: CBC without leukocytosis or anemia. CMP has a mildly elevated BUN, Ca 8.3, total protein 6.2, and albumin of 3.0. No kidney dysfunction or significant electrolyte dysfunction. Liver enzymes specifically are normal. Abnormal labs likely due to chronic malnutrition.  ? ?Reassessment reveals improved abdominal symptoms. Patient requesting social work to provide taxi voucher. I feel that this patient has been reasonably screened and is stable for discharge. ? ? ? ?Charting  Requirements ?Additional history is obtained from:  Independent historian ?External Records from outside source obtained and reviewed including: several ED visits ?Social Determinants of Health:  homeless, Access to medical

## 2021-07-08 NOTE — TOC Progression Note (Signed)
Transition of Care (TOC) - Progression Note  ? ? ?Patient Details  ?Name: Angela Medina ?MRN: [id number] ?Date of Birth: 10-12-1950 ? ?Transition of Care (TOC) CM/SW Contact  ?Verdell Carmine, RN ?Phone Number: ?07/08/2021, 10:03 AM ? ?Clinical Narrative:    ? ?One of many ED visits for various complaints. Ordered new walker for patient, unsure if insurance will cover, [employer] is checking.  ?Recommend [facility] shelter tonight starts at 7p at [place]. ? ?  ?  ? ?Expected Discharge Plan and Services ?  ?  ? DC to shelter ?  ?  ?                ?DME Arranged: Walker rolling with seat ?DME Agency: [employer] ?Date DME Agency Contacted: 07/08/21 ?Time DME Agency Contacted: [phone] ?  ?  ?  ?  ?  ?  ? ? ?Social Determinants of Health (SDOH) Interventions ?  ? ?Readmission Risk Interventions ?No flowsheet data found. ? ?

## 2021-07-08 NOTE — ED Triage Notes (Signed)
Pt bib ems with reports of "regurgitation". Pt given water with ems and able to tolerate. [facility].  ?

## 2021-07-08 NOTE — ED Notes (Signed)
Patient Alert and oriented to baseline. Stable and ambulatory to baseline. Patient verbalized understanding of the discharge instructions.  Patient belongings were taken by the patient.  ?Pt refused DC vitals, upset that we did not have a cab voucher.  ?

## 2021-07-08 NOTE — ED Notes (Signed)
RN reviewed discharge instructions with pt. Pt verbalized understanding and had no further questions. VSS upon discharge.  

## 2021-07-08 NOTE — Discharge Instructions (Addendum)
Your liver enzymes appeared normal from both sets of labs today.   ? ?Resources for the local shelters have been provided for you in your discharge paperwork. ?

## 2021-07-08 NOTE — ED Notes (Signed)
Pt given diet sprite per pt request.  ? ?

## 2021-07-08 NOTE — ED Triage Notes (Signed)
Patient here requesting re-testing of her blood stating she thinks "something is wrong with it", patient unable to elaborate on her concerns. Patient who is homeless states she does not feel like she is able to fend for herself outside. ?

## 2021-07-08 NOTE — ED Notes (Signed)
Taxi called

## 2021-07-08 NOTE — ED Provider Notes (Signed)
?[facility] EMERGENCY DEPARTMENT ?Provider Note ? ? ?CSN: [id number] ?Arrival date & time: 07/08/21  1446 ? ?  ? ?History ? ?Chief Complaint  ?Patient presents with  ? Weakness  ? ? ?Angela Medina is a 71 y.o. female with chief complaint of homelessness.  25th time in 6 months.  History of schizoaffective disorder and paranoid delusion.  Concerned something may be "wrong" with her blood.  Just discharged from the hospital earlier today.  Came back because she believes her liver panel was incorrect.  Wants to make sure her liver enzymes are not elevated.  Endorses having hepatitis as a teenager.  Denies abdominal pain, N/V, recent illness, or any new symptoms/trauma since discharge this morning.  Seen multiple times in the ER due to poor determinants of social health.  Social work has been extensively involved to facilitate shelter placement for patient. ? ?The history is provided by the patient and medical records.  ?Weakness ? ?  ? ?Home Medications ?Prior to Admission medications   ?Medication Sig Start Date End Date Taking? Authorizing Provider  ?albuterol (VENTOLIN HFA) 108 (90 Base) MCG/ACT inhaler Inhale 2 puffs into the lungs every 6 (six) hours as needed for wheezing or shortness of breath. ?Patient not taking: Reported on 06/20/2021 04/02/21   Elige Radon, MD  ?doxycycline (VIBRAMYCIN) 100 MG capsule Take 1 capsule (100 mg total) by mouth 2 (two) times daily. ?Patient not taking: Reported on 06/20/2021 05/02/21   Alveria Apley, PA-C  ?OLANZapine (ZYPREXA) 5 MG tablet Take 1 tablet (5 mg total) by mouth at bedtime. ?Patient not taking: Reported on 07/07/2021 06/27/21   Derwood Kaplan, MD  ?zolpidem (AMBIEN) 5 MG tablet Take 1 tablet (5 mg total) by mouth at bedtime as needed for sleep. ?Patient not taking: Reported on 07/07/2021 06/27/21   Derwood Kaplan, MD  ?   ? ?Allergies    ?Methotrexate derivatives and Penicillins   ? ?Review of Systems   ?Review of Systems  ?Neurological:   Positive for weakness.  ? ?Physical Exam ?Updated Vital Signs ?BP 122/80   Pulse 74   Temp 98.9 ?F (37.2 ?C) (Oral)   Resp 16   SpO2 96%  ?Physical Exam ?Vitals and nursing note reviewed.  ?Constitutional:   ?   General: She is not in acute distress. ?   Appearance: She is well-developed. She is not ill-appearing or diaphoretic.  ?   Comments: Appears unkempt and disheveled  ?HENT:  ?   Head: Normocephalic and atraumatic.  ?   Nose: Nose normal.  ?   Mouth/Throat:  ?   Pharynx: Oropharynx is clear.  ?Eyes:  ?   General: No scleral icterus. ?   Conjunctiva/sclera: Conjunctivae normal.  ?Cardiovascular:  ?   Rate and Rhythm: Normal rate and regular rhythm.  ?Pulmonary:  ?   Effort: Pulmonary effort is normal. No respiratory distress.  ?Abdominal:  ?   Palpations: Abdomen is soft.  ?   Tenderness: There is no abdominal tenderness.  ?Musculoskeletal:     ?   General: No swelling.  ?   Cervical back: Neck supple.  ?Skin: ?   General: Skin is warm and dry.  ?   Capillary Refill: Capillary refill takes less than 2 seconds.  ?   Coloration: Skin is not jaundiced.  ?Neurological:  ?   Mental Status: She is alert and oriented to person, place, and time.  ?Psychiatric:     ?   Attention and Perception: She is inattentive.     ?  Mood and Affect: Mood normal.     ?   Behavior: Behavior is slowed. Behavior is cooperative.     ?   Thought Content: Thought content is paranoid.  ? ? ?ED Results / Procedures / Treatments   ?Labs ?(all labs ordered are listed, but only abnormal results are displayed) ?Labs Reviewed  ?HEPATIC FUNCTION PANEL - Abnormal; Notable for the following components:  ?    Result Value  ? Total Protein 6.4 (*)   ? Albumin 3.1 (*)   ? All other components within normal limits  ? ? ?EKG ?None ? ?Radiology ?No results found. ? ?Procedures ?Procedures  ? ? ?Medications Ordered in ED ?Medications - No data to display ? ?ED Course/ Medical Decision Making/ A&P ?  ?                        ?Medical Decision  Making ?Amount and/or Complexity of Data Reviewed ?External Data Reviewed: labs, radiology and notes. ?Labs: ordered. Decision-making details documented in ED Course. ? ? ? ?71 year old female comes in with chief complaint of homelessness and obvious barriers to care.  She presents with concern for difficulty with accessing additional resources.  Concerned that lab values obtained today regarding her liver may be inaccurate.  Presents with zero symptoms. Currently she does want something to eat and drink.   Eating and drinking comfortably.   ? ?I personally ordered and interpreted patient's labs.  Pertinent results include: LFTs unremarkable. ?  ?I personally reviewed patient's previous ER visit notes, including social work and psychiatry from the last week.  Social Work note from 07/07/21 below: ? ?CSW spoke with [facility] leadership in regards to patient being psych cleared by the psychiatry department. Patient does not have a legal guardian and legally CSW cannot hold patient in the ED. Patient has an assigned APS worker [name] with [facility]. CSW is unable to place patient in a SNF due to patient being homeless and having no disposition after SNF placement. CSW is also unable to place patient in a long term care facility due to patient not having Medicaid. CSW helped patient complete a medicaid screening with [employer] two weeks ago. CSW was informed by [employer] that patient does not qualify for full medicaid which would cover long term care placement. Patient would have to pay out of pocket for LTC. Patient also receives $1400 a month in SSI and also has a $6,000 check that has not been cashed due to patient not having an updated ID. CSW has spoken to patients sister Angela Medina previous admissions and that patient will not allow her sister to help her obtain an ID. APS was contacted on March 14th due to these above concerns. APS has not communicated with TOC to determine their assessment with  patient earlier this week. TOC leadership advised that patient will need to be discharged due to the ED being unable to place patient in a facility. Messages were left with patients APS worker yesterday 07/06/21 and today 07/07/21. CSW also left a message with the on-call APS line to inform them of patient being discharge and is they needed to conduct any follow-up visits,  ? ?Attempted to contact Social Worker's office, but closed.  At this time, recommendation stands that patient be discharged to shelter.  [facility]Cs is involved.  Patient has no insurance and has not been able to acquire Medicaid. ?  ?After consideration of recent social work evaluation, recent psychiatry evaluation, the  patient does not meet criteria for inpatient admission or further workup today.  Emergency department workup does not suggest an emergent condition requiring admission or immediate intervention beyond what has been performed at this time.  The patient is safe for discharge.  Was provided additional resources, transportation to our homeless shelter with the resources available there.  Strict return precautions discussed. ? ? ? ? ? ? ? ?Final Clinical Impression(s) / ED Diagnoses ?Final diagnoses:  ?Weakness  ? ? ?Rx / DC Orders ?ED Discharge Orders   ? ? None  ? ?  ? ? ?  ?Cecil CobbsCockerham, Brailynn Breth M, PA-C ?07/09/21 0020 ? ?  ?Glendora ScoreKommor, Madison, MD ?07/09/21 2332 ? ?

## 2021-07-09 ENCOUNTER — Emergency Department (HOSPITAL_COMMUNITY): Payer: Medicare Other

## 2021-07-09 ENCOUNTER — Emergency Department (HOSPITAL_COMMUNITY)
Admission: EM | Admit: 2021-07-09 | Discharge: 2021-07-09 | Disposition: A | Discharge status: Home / Self Care | Payer: Medicare Other | Attending: Student | Admitting: Student

## 2021-07-09 ENCOUNTER — Other Ambulatory Visit: Payer: Self-pay

## 2021-07-09 DIAGNOSIS — Z609 Problem related to social environment, unspecified: Secondary | ICD-10-CM | POA: Insufficient documentation

## 2021-07-09 DIAGNOSIS — R262 Difficulty in walking, not elsewhere classified: Secondary | ICD-10-CM | POA: Diagnosis present

## 2021-07-09 DIAGNOSIS — Z659 Problem related to unspecified psychosocial circumstances: Secondary | ICD-10-CM

## 2021-07-09 NOTE — ED Triage Notes (Signed)
Patient BIB [facility] for complaint of new immobility. Patient seen multiple times for same complaint. Patient uncooperative with EMS staff, is alert, oriented, and in no apparent distress at this time. ?

## 2021-07-09 NOTE — ED Notes (Addendum)
Pt verbally abuse to staff at d/c due to not being able to stay in room. ?

## 2021-07-09 NOTE — Progress Notes (Signed)
CSW staffed case with [facility] supervisor Jiles Crocker to inform of no safe d/c plan. ? ?Pt continues to state that she cannot walk although cleared by EDP. Pt no longer has any of the walkers provided over the past week. Pt is homeless and there is no [employer] although temperature is expected to drop below freezing overnight for several hours.  ? ?Dur to all of these factors, Pt will d/c to the lobby until the morning when more options may become available. Pt wishes to make an appointment with [facility] for primary care. CSW explained that efforts can be made for appointment, Pt will not be seen tomorrow morning. ? ?Pt given 2 sandwiches, crackers and a drink as well as a water. Pt given warm blankets and sat in lobby. ?

## 2021-07-09 NOTE — ED Notes (Signed)
Pt provided with cab voucher, assisted to cab, pt to go to [facility]. ?

## 2021-07-09 NOTE — ED Notes (Signed)
Patient assisted to use walker by staff member.  Requesting to "have a witness" prior to attempt.  Patient able to take two steps while screaming and attempted to lower self to ground.  Patient encouraged to stand and returned to bed.  PA made aware ?

## 2021-07-09 NOTE — ED Provider Notes (Signed)
?[facility] EMERGENCY DEPARTMENT ?Provider Note ? ? ?CSN: [id number] ?Arrival date & time: 07/09/21  1008 ? ?  ? ?History ? ?Chief Complaint  ?Patient presents with  ? Immobility  ? ? ?Angela Medina is a 71 y.o. female presenting to the ED for trouble walking.  States that she has had trouble walking that is gradually worsening for months.  She wants to be reevaluated for this.  States that she has fallen over the past few weeks but denies any recent falls.  She is requesting orange juice.  She was requesting to check LFTs.  She denies any numbness, chest pain ? ?HPI ? ?  ? ?Home Medications ?Prior to Admission medications   ?Not on File  ?   ? ?Allergies    ?Methotrexate derivatives and Penicillins   ? ?Review of Systems   ?Review of Systems  ?Constitutional:  Negative for chills and fever.  ?Neurological:  Negative for weakness.  ? ?Physical Exam ?Updated Vital Signs ?BP 135/83   Pulse 67   Temp 98.9 ?F (37.2 ?C) (Oral)   Resp 18   SpO2 93%  ?Physical Exam ?Vitals and nursing note reviewed.  ?Constitutional:   ?   General: She is not in acute distress. ?   Appearance: She is well-developed.  ?HENT:  ?   Head: Normocephalic and atraumatic.  ?   Nose: Nose normal.  ?Eyes:  ?   General: No scleral icterus.    ?   Left eye: No discharge.  ?   Conjunctiva/sclera: Conjunctivae normal.  ?Cardiovascular:  ?   Rate and Rhythm: Normal rate and regular rhythm.  ?   Heart sounds: Normal heart sounds. No murmur heard. ?  No friction rub. No gallop.  ?Pulmonary:  ?   Effort: Pulmonary effort is normal. No respiratory distress.  ?   Breath sounds: Normal breath sounds.  ?Abdominal:  ?   General: Bowel sounds are normal. There is no distension.  ?   Palpations: Abdomen is soft.  ?   Tenderness: There is no abdominal tenderness. There is no guarding.  ?Musculoskeletal:     ?   General: Normal range of motion.  ?   Cervical back: Normal range of motion and neck supple.  ?   Comments: Moving all extremities  without difficulty.  2+ DP pulse and 2+ radial pulse palpated bilaterally.  ?Skin: ?   General: Skin is warm and dry.  ?   Findings: No rash.  ?Neurological:  ?   Mental Status: She is alert.  ?   Motor: No abnormal muscle tone.  ?   Coordination: Coordination normal.  ?   Comments: Normal speech  ? ? ?ED Results / Procedures / Treatments   ?Labs ?(all labs ordered are listed, but only abnormal results are displayed) ?Labs Reviewed - No data to display ? ?EKG ?None ? ?Radiology ?DG Hip Unilat With Pelvis 2-3 Views Left ? ?Result Date: 07/09/2021 ?CLINICAL DATA:  Left hip pain. EXAM: DG HIP (WITH OR WITHOUT PELVIS) 2-3V LEFT COMPARISON:  07/06/2021. FINDINGS: There is no evidence of hip fracture or dislocation. Mild degenerative changes are present at the hips bilaterally. IMPRESSION: 1. No acute fracture or dislocation. 2. 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 Electronically Signed   By: Thornell Sartorius M.D.   On: 07/09/2021 01:00   ? ?Procedures ?Procedures  ? ? ?Medications Ordered in ED ?Medications - No data to display ? ?ED Course/ Medical Decision Making/ A&P ?  ?                        ?  Medical Decision Making ? ?71 year old female presenting to the ED stating that she is having trouble walking.  She states that this is chronic but gradually worsening.  She would like her LFTs checked.  Of note she has had 27 visits in the past 6 months most recently being very early this morning discharged at 1 AM.  She did have blood work including LFTs checked less than 24 hours ago and these as well as her other labs were unremarkable. I got in touch with her APS worker and unfortunately they do not have any other updates in regards to placement, but they will follow up.  She is hemodynamically stable and no emergent process noted at this time.  Will discharge with APS follow-up. ? ? ?Patient is hemodynamically stable, in NAD. Evaluation does not show pathology that would require ongoing emergent intervention or  inpatient treatment. I explained the diagnosis to the patient. Pain has been managed and has no complaints prior to discharge. Patient is comfortable with above plan and is stable for discharge at this time. All questions were answered prior to disposition. Strict return precautions for returning to the ED were discussed. Encouraged follow up with PCP.  ? ?An After Visit Summary was printed and given to the patient. ? ? ?Portions of this note were generated with Scientist, clinical (histocompatibility and immunogenetics)Dragon dictation software. Dictation errors may occur despite best attempts at proofreading. ? ? ? ? ? ? ? ? ?Final Clinical Impression(s) / ED Diagnoses ?Final diagnoses:  ?Social problem  ? ? ?Rx / DC Orders ?ED Discharge Orders   ? ? None  ? ?  ? ? ?  Dietrich Pates?Katalaya Beel, PA-C ?07/09/21 1906 ? ?  ?Glendora ScoreKommor, [place], MD ?07/09/21 2339 ? ?

## 2021-07-09 NOTE — ED Triage Notes (Signed)
Patient BIB EMS for evaluation of L hip pain.  Was seen at [facility] ED twice on 3/19 for different complaints.  D/c to shelter via taxi.  When laying down, pt reports she heard "something pop" and now c/o hip pain.  No reports of fall or injury.   ?

## 2021-07-09 NOTE — Progress Notes (Signed)
CSW contacted patients [facility] APS worker [place], [phone] to inform her patient is currently in the waiting room. Charise Carwin stated she last spoke with patient on 07/07/21 and patient indicated to her that she was taking a cab [facility] [employer]. CSW stated she is not sure how true that is and patient has been back in the ED multiple times since Saturday. [name] stated she will be on her way to speak with patient.  ?

## 2021-07-09 NOTE — Progress Notes (Signed)
CSW called Pt's APS social worker [name] @ [phone] to find out if she has made contact with Pt today. ? ?Left message requesting call back. ?

## 2021-07-09 NOTE — ED Notes (Signed)
Patient upset at time of discharge.  Verbally abusive to staff members attempting to assist her.  Patient states "they didn't even draw blood."  Patient informed that labs had been drawn during her last visit and were not needed at this time.   Patient refusing to assist staff in helping her dress or get into wheelchair.  Requested assistance with a cab.  Informed patient that we would not be able to assist with that at this time.  Was provided with assistance earlier at [facility] ED and assisted into shelter.  Pt left shelter shortly after arriving complaining of pain.    ?

## 2021-07-09 NOTE — Progress Notes (Signed)
CSW received update that APS did come to [facility] to speak with Pt. ? ?APS has made no decisions regarding Pt at this time. ?

## 2021-07-09 NOTE — Discharge Instructions (Signed)
Your lab work less than 24 hours ago was without any emergent abnormalities. ?Follow-up with your primary care provider. ?Return to the ER if you start to experience falls, severe headache, blurry vision, chest pain or shortness of breath ?

## 2021-07-09 NOTE — Discharge Instructions (Addendum)
Follow-up with primary care and orthopedics as soon as possible.  Return to the emergency department for new or worsening symptoms or any other concerns. ?

## 2021-07-09 NOTE — ED Provider Notes (Signed)
?[facility]-EMERGENCY DEPT ?Provider Note ? ? ?CSN: [id number] ?Arrival date & time: 07/08/21  2349 ? ?  ? ?History ? ?Chief Complaint  ?Patient presents with  ? Hip Pain  ? ? ?Angela Medina is a 71 y.o. female with a history of schizoaffective disorder, homelessness, and tobacco use who returns to the emergency department with complaints of left hip pain.  Patient states she rolled over and felt a pop in her left hip causing pain.  Worse with movement.  No alleviating factors. ? ?HPI ? ?  ? ?Home Medications ?Prior to Admission medications   ?Medication Sig Start Date End Date Taking? Authorizing Provider  ?albuterol (VENTOLIN HFA) 108 (90 Base) MCG/ACT inhaler Inhale 2 puffs into the lungs every 6 (six) hours as needed for wheezing or shortness of breath. ?Patient not taking: Reported on 06/20/2021 04/02/21   Angela Radon, MD  ?doxycycline (VIBRAMYCIN) 100 MG capsule Take 1 capsule (100 mg total) by mouth 2 (two) times daily. ?Patient not taking: Reported on 06/20/2021 05/02/21   Angela Apley, PA-C  ?OLANZapine (ZYPREXA) 5 MG tablet Take 1 tablet (5 mg total) by mouth at bedtime. ?Patient not taking: Reported on 07/07/2021 06/27/21   Angela Kaplan, MD  ?zolpidem (AMBIEN) 5 MG tablet Take 1 tablet (5 mg total) by mouth at bedtime as needed for sleep. ?Patient not taking: Reported on 07/07/2021 06/27/21   Angela Kaplan, MD  ?   ? ?Allergies    ?Methotrexate derivatives and Penicillins   ? ?Review of Systems   ?Review of Systems  ?Constitutional:  Negative for chills and fever.  ?Respiratory:  Negative for shortness of breath.   ?Cardiovascular:  Negative for chest pain.  ?Gastrointestinal:  Negative for abdominal pain.  ?Musculoskeletal:  Positive for arthralgias.  ?All other systems reviewed and are negative. ? ?Physical Exam ?Updated Vital Signs ?BP 107/62 (BP Location: Left Arm)   Pulse 72   Temp 98 ?F (36.7 ?C) (Oral)   Resp 18   Ht 5' (1.524 m)   Wt 63.5 kg   SpO2 93%   BMI 27.34  kg/m?  ?Physical Exam ?Vitals and nursing note reviewed.  ?Constitutional:   ?   General: She is not in acute distress. ?   Appearance: She is well-developed. She is not toxic-appearing.  ?   Comments: Disheveled appearing.   ?HENT:  ?   Head: Normocephalic and atraumatic.  ?Eyes:  ?   General:     ?   Right eye: No discharge.     ?   Left eye: No discharge.  ?   Conjunctiva/sclera: Conjunctivae normal.  ?Cardiovascular:  ?   Rate and Rhythm: Normal rate and regular rhythm.  ?   Comments: 2+ symmetric DP pulses bilaterally. ?Pulmonary:  ?   Effort: No respiratory distress.  ?   Breath sounds: Normal breath sounds. No wheezing or rales.  ?Abdominal:  ?   General: There is no distension.  ?   Palpations: Abdomen is soft.  ?   Tenderness: There is no abdominal tenderness.  ?Musculoskeletal:  ?   Cervical back: Neck supple.  ?   Comments: Patient is actively moving all joints throughout the bilateral upper and lower extremities.  Tender outpatient to the left hip.  Otherwise no focal bony tenderness.  ?Skin: ?   General: Skin is warm and dry.  ?   Capillary Refill: Capillary refill takes less than 2 seconds.  ?Neurological:  ?   Mental Status: She is alert.  ?  Comments: Clear speech.  Sensation grossly intact bilateral upper and lower extremities.  5 5 strength with plantar dorsiflexion bilaterally with grip strength.  ?Psychiatric:     ?   Behavior: Behavior normal.  ? ? ?ED Results / Procedures / Treatments   ?Labs ?(all labs ordered are listed, but only abnormal results are displayed) ?Labs Reviewed - No data to display ? ?EKG ?None ? ?Radiology ?DG Hip Unilat With Pelvis 2-3 Views Left ? ?Result Date: 07/09/2021 ?CLINICAL DATA:  Left hip pain. EXAM: DG HIP (WITH OR WITHOUT PELVIS) 2-3V LEFT COMPARISON:  07/06/2021. FINDINGS: There is no evidence of hip fracture or dislocation. Mild degenerative changes are present at the hips bilaterally. IMPRESSION: 1. No acute fracture or dislocation. 2. Mild degenerative changes  at the hips bilaterally. Electronically Signed   By: Thornell SartoriusLaura  Medina M.D.   On: 07/09/2021 01:00   ? ?Procedures ?Procedures  ? ? ?Medications Ordered in ED ?Medications - No data to display ? ?ED Course/ Medical Decision Making/ A&P ?  ?                        ?Medical Decision Making ?Amount and/or Complexity of Data Reviewed ?Radiology: ordered. ? ? ?Patient presents to the ED with complaints of left pain.  Nontoxic, vitals unremarkable.  ? ?Additional history obtained:  ?Chart & nursing note reviewed.  ?This is patient's 16th ED visit this month.  She appears to present with a variety of complaints, frequently left hip pain.  Both psychiatry and transition of care team have been involved in her care.  Progress note by TOC reviewed from within the past 48 hours reviewed, unable to be placed in SNF. APS has been involved.  ?Social Work note from 07/07/21 below: ?  ?[facility] by the psychiatry department. Patient does not have a legal guardian and legally CSW cannot hold patient in the ED. Patient has [place] [facility] CSW is unable to place patient in a SNF due to patient being homeless and having no disposition after SNF placement. CSW is also unable to place patient in a long term care facility due to patient not having Medicaid. CSW helped patient complete a medicaid screening with [employer] two weeks ago. CSW was informed by [employer] that patient does not qualify for full medicaid which would cover long term care placement. Patient would have to pay out of pocket for LTC. Patient also receives $1400 a month in SSI and also has a $6,000 check that has not been cashed due to patient not having an updated ID. CSW has spoken to patients sister Angela Medina previous admissions and that patient will not allow her sister to help her obtain an ID. APS was contacted on March 14th due to these above concerns. APS has  not communicated with TOC to determine their assessment with patient earlier this week. TOC leadership advised that patient will need to be discharged due to the ED being unable to place patient in a facility. Messages were left with patients APS worker yesterday 07/06/21 and today 07/07/21. CSW also left a message with the on-call APS line to inform them of patient being discharge and is they needed to conduct any follow-up visits,  ? ?She was ultimately just seen in the emergency department earlier tonight and discharge. ? ?Imaging Studies ordered:  ?I ordered and viewed the following imaging, agree with  radiologist impression:  ?Left hip xray: 1. No acute fracture or dislocation. 2. 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 ? ?ED Course:  ?X-ray without acute fracture or dislocation.  Given no trauma, able to range and lay on her left hip without difficulty do not feel that hip CT is necessary at this time for further assessment.  There are no overlying signs of infection, patient is neurovascularly intact distally. Patient able to ambulate in the ED. Overall appears appropriate for discharge. I discussed results, treatment plan, need for follow-up, and return precautions with the patient. Provided opportunity for questions, patient confirmed understanding and is in agreement with plan.  ? ?Social determinants:  ?- Homelessness ? ?Portions of this note were generated with Scientist, clinical (histocompatibility and immunogenetics). Dictation errors may occur despite best attempts at proofreading ?  ? ? ? ? ? ? ? ?Final Clinical Impression(s) / ED Diagnoses ?Final diagnoses:  ?Left hip pain  ? ? ?Rx / DC Orders ?ED Discharge Orders   ? ? None  ? ?  ? ? ?  ?Cherly Anderson, PA-C ?07/09/21 9166 ? ?  ?Sabas Sous, MD ?07/09/21 [phone] ? ?

## 2021-07-10 ENCOUNTER — Other Ambulatory Visit: Payer: Self-pay

## 2021-07-10 ENCOUNTER — Emergency Department (HOSPITAL_COMMUNITY)
Admission: EM | Admit: 2021-07-10 | Discharge: 2021-07-10 | Disposition: A | Discharge status: Home / Self Care | Payer: Medicare Other | Attending: Emergency Medicine | Admitting: Emergency Medicine

## 2021-07-10 ENCOUNTER — Emergency Department (HOSPITAL_COMMUNITY): Payer: Medicare Other

## 2021-07-10 DIAGNOSIS — M25532 Pain in left wrist: Secondary | ICD-10-CM | POA: Insufficient documentation

## 2021-07-10 DIAGNOSIS — W07XXXA Fall from chair, initial encounter: Secondary | ICD-10-CM | POA: Diagnosis not present

## 2021-07-10 DIAGNOSIS — Y92521 Bus station as the place of occurrence of the external cause: Secondary | ICD-10-CM | POA: Diagnosis not present

## 2021-07-10 DIAGNOSIS — R262 Difficulty in walking, not elsewhere classified: Secondary | ICD-10-CM | POA: Diagnosis not present

## 2021-07-10 DIAGNOSIS — M79642 Pain in left hand: Secondary | ICD-10-CM | POA: Diagnosis not present

## 2021-07-10 DIAGNOSIS — Z659 Problem related to unspecified psychosocial circumstances: Secondary | ICD-10-CM

## 2021-07-10 DIAGNOSIS — J3489 Other specified disorders of nose and nasal sinuses: Secondary | ICD-10-CM | POA: Insufficient documentation

## 2021-07-10 DIAGNOSIS — Z5900 Homelessness unspecified: Secondary | ICD-10-CM | POA: Diagnosis not present

## 2021-07-10 DIAGNOSIS — Z609 Problem related to social environment, unspecified: Secondary | ICD-10-CM | POA: Insufficient documentation

## 2021-07-10 NOTE — ED Notes (Signed)
Pt called in lobby x2 with no answer.  ?

## 2021-07-10 NOTE — ED Notes (Signed)
Patient was at the bus stop, fell out of wheelchair on the ground.  EMS arrived for patient to say that she is now paralyzed after falling.  She states she needs help. ?

## 2021-07-10 NOTE — ED Notes (Signed)
Food and drink given to pt.  

## 2021-07-10 NOTE — ED Triage Notes (Signed)
Pt. Stated, Angela Medina been sick all over since yesterday and I cant get up on my own. I was in the bus stop and I ask to be wheeled back into hospital with the same complaint. ?

## 2021-07-10 NOTE — Progress Notes (Signed)
CSW has not received an update from patients APS worker [name] about the next steps with patients care. CSW was told that patients case has not been staffed with the APS supervisor. The case has been open since 07/03/21. [name] stated she did not know if they would take guardianship of patient and she also wasn't sure if they will become patients payee. [name] stated she plans on discussing these concerns with her supervisor. [facility] and about patients case not being staffed with an APS supervisor. No formal decisions have been made. Patient was observed this morning with a handful of money but unknown amount.  ?

## 2021-07-10 NOTE — ED Notes (Signed)
Patient transported to X-ray 

## 2021-07-10 NOTE — ED Provider Notes (Signed)
?[facility] ?Provider Note ? ? ?CSN: [id number] ?Arrival date & time: 07/10/21  1224 ? ?  ?History ? ?Chief Complaint  ?Patient presents with  ? Generalized Body Aches  ? Shortness of Breath  ? ? ?Angela Medina is a 71 y.o. female ? ?Well-known here to the emergency department daily visits over the last 30 days, unfortunately patient is homeless.  Has had difficulty walking at baseline for months.  She uses a walker.  States after being seen yesterday she went to the bus stop and fell off the chair.  She has pain to her left hand and wrist.  States occasionally when it is cold outside she will have rhinorrhea.  She denies any head injury, numbness, no weakness, chest pain, shortness of breath, back pain, abdominal pain, diarrhea, dysuria.  She wants to be reassessed to have a place to sleep.  She is requesting food as she states she has not eaten today.  States she intermittently goes to the [facility] however they " cannot handle my needs." 27 visits in the last 2 months. ? ?Has active APC case however per TOC note no case worker assigned.  ? ?HPI ? ?  ? ?Home Medications ?Prior to Admission medications   ?Not on File  ?   ? ?Allergies    ?Methotrexate derivatives and Penicillins   ? ?Review of Systems   ?Review of Systems  ?Constitutional: Negative.   ?HENT: Negative.    ?Respiratory: Negative.    ?Cardiovascular: Negative.   ?Gastrointestinal: Negative.   ?Genitourinary: Negative.   ?Musculoskeletal:   ?     Left hand pain  ?Neurological: Negative.   ?All other systems reviewed and are negative. ? ?Physical Exam ?Updated Vital Signs ?BP 138/79 (BP Location: Left Arm)   Pulse (!) 57   Temp 98.4 ?F (36.9 ?C) (Oral)   Resp 18   SpO2 96%  ?Physical Exam ?Vitals and nursing note reviewed.  ?Constitutional:   ?   General: She is not in acute distress. ?   Appearance: She is not ill-appearing.  ?   Comments: Disheveled  ?HENT:  ?   Head: Atraumatic.  ?Eyes:  ?   Pupils: Pupils are  equal, round, and reactive to light.  ?Cardiovascular:  ?   Rate and Rhythm: Normal rate.  ?   Pulses: Normal pulses.     ?     Radial pulses are 2+ on the right side and 2+ on the left side.  ?     Dorsalis pedis pulses are 2+ on the right side and 2+ on the left side.  ?   Heart sounds: Normal heart sounds.  ?Pulmonary:  ?   Effort: Pulmonary effort is normal. No respiratory distress.  ?   Breath sounds: Normal breath sounds.  ?Chest:  ?   Comments: Non tender ?Abdominal:  ?   General: Bowel sounds are normal. There is no distension.  ?   Palpations: Abdomen is soft.  ?Musculoskeletal:     ?   General: Normal range of motion.  ?   Cervical back: Normal range of motion.  ?   Right lower leg: No tenderness. No edema.  ?   Left lower leg: No tenderness. No edema.  ?   Comments: Diffuse tenderness left hand.  No scaphoid tenderness.  No bony tenderness to bilateral forearms.  No midline C/T/L tenderness.  She is able to sit up in bed without difficulty.  Moves bilateral upper and lower  extremities freely.  Actively able to take the cap off of a water bottle and put to mouth without difficulty  ?Skin: ?   General: Skin is warm and dry.  ?   Capillary Refill: Capillary refill takes less than 2 seconds.  ?Neurological:  ?   General: No focal deficit present.  ?   Mental Status: She is alert.  ?   Comments: Cn 2-12 grossly intact ?Will not actively participate in strength testing ?Freely moves all 4 extremities without difficulty  ?Psychiatric:     ?   Mood and Affect: Mood normal.  ? ?ED Results / Procedures / Treatments   ?Labs ?(all labs ordered are listed, but only abnormal results are displayed) ?Labs Reviewed - No data to display ? ?EKG ?None ? ?Radiology ?DG Wrist Complete Left ? ?Result Date: 07/10/2021 ?CLINICAL DATA:  fall EXAM: LEFT HAND - COMPLETE 3+ VIEW; LEFT WRIST - COMPLETE 3+ VIEW COMPARISON:  None. FINDINGS: Left hand: There is no evidence of fracture or dislocation. First carpometacarpal moderate to  severe degenerative changes. Interphalangeal joint degenerative changes. Soft tissues are unremarkable. Left wrist: No evidence of fracture, dislocation, or joint effusion. No evidence of severe arthropathy. No aggressive appearing focal bone abnormality. Soft tissues are unremarkable. IMPRESSION: No acute displaced fracture or dislocation of the left hand and wrist. Electronically Signed   By: Iven Finn M.D.   On: 07/10/2021 20:48  ? ?DG Hand Complete Left ? ?Result Date: 07/10/2021 ?CLINICAL DATA:  fall EXAM: LEFT HAND - COMPLETE 3+ VIEW; LEFT WRIST - COMPLETE 3+ VIEW COMPARISON:  None. FINDINGS: Left hand: There is no evidence of fracture or dislocation. First carpometacarpal moderate to severe degenerative changes. Interphalangeal joint degenerative changes. Soft tissues are unremarkable. Left wrist: No evidence of fracture, dislocation, or joint effusion. No evidence of severe arthropathy. No aggressive appearing focal bone abnormality. Soft tissues are unremarkable. IMPRESSION: No acute displaced fracture or dislocation of the left hand and wrist. Electronically Signed   By: Iven Finn M.D.   On: 07/10/2021 20:48  ? ?DG Hip Unilat With Pelvis 2-3 Views Left ? ?Result Date: 07/09/2021 ?CLINICAL DATA:  Left hip pain. EXAM: DG HIP (WITH OR WITHOUT PELVIS) 2-3V LEFT COMPARISON:  07/06/2021. FINDINGS: There is no evidence of hip fracture or dislocation. Mild degenerative changes are present at the hips bilaterally. IMPRESSION: 1. No acute fracture or dislocation. 2. 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 Electronically Signed   By: Brett Fairy M.D.   On: 07/09/2021 01:00   ? ?Procedures ?Procedures  ? ? ?Medications Ordered in ED ?Medications - No data to display ? ?ED Course/ Medical Decision Making/ A&P ?  ? ? ?71 year old here for evaluation of "full body imaging and labs."  Patient disheveled, seen almost daily over the last month.  Unfortunately she is homeless and has obvious barriers  to her health care as well as social concerns.  She was seen by our psychiatric colleagues and psychiatrically cleared.  Has been seen by social work multiple times. See below for recent notes. ? ?CSW spoke with [facility] leadership in regards to patient being psych cleared by the psychiatry department. Patient does not have a legal guardian and legally CSW cannot hold patient in the ED. Patient has an assigned APS worker [place] with [facility]. CSW is unable to place patient in a SNF due to patient being homeless and having no disposition after SNF placement. CSW is also unable to place patient in a long term care facility  due to patient not having Medicaid. CSW helped patient complete a medicaid screening with [employer] two weeks ago. CSW was informed by [employer] that patient does not qualify for full medicaid which would cover long term care placement. Patient would have to pay out of pocket for LTC. Patient also receives $1400 a month in SSI and also has a $6,000 check that has not been cashed due to patient not having an updated ID. CSW has spoken to patients sister Ulyses Amor previous admissions and that patient will not allow her sister to help her obtain an ID. APS was contacted on March 14th due to these above concerns. APS has not communicated with TOC to determine their assessment with patient earlier this week. TOC leadership advised that patient will need to be discharged due to the ED being unable to place patient in a facility. Messages were left with patients APS worker yesterday 07/06/21 and today 07/07/21. CSW also left a message with the on-call APS line to inform them of patient being discharge and is they needed to conduct any follow-up visits,  ? ?CSW has not received an update from patients APS worker [name] about the next steps with patients care. CSW was told that patients case has not been staffed with the [facility] supervisor. The case has been open since 07/03/21. [name] stated she  did not know if they would take guardianship of patient and she also wasn't sure if they will become patients payee. [name] stated she plans on discussing these concerns with her supervisor. CSW discussed conc

## 2021-07-10 NOTE — ED Notes (Signed)
Discharge instructions reviewed and explained, pt took off blood pressure cuff and pulse ox cable and refused discharge VS. Pt requested to be taken outside in a wheelchair. Pt was assisted to the wheelchair and taken outside via wheelchair for discharge without incident. ?

## 2021-07-11 ENCOUNTER — Emergency Department (HOSPITAL_BASED_OUTPATIENT_CLINIC_OR_DEPARTMENT_OTHER)
Admission: EM | Admit: 2021-07-11 | Discharge: 2021-07-11 | Disposition: A | Discharge status: Home / Self Care | Payer: Medicare Other | Source: Home / Self Care | Attending: Emergency Medicine | Admitting: Emergency Medicine

## 2021-07-11 ENCOUNTER — Encounter: Payer: Self-pay | Admitting: *Deleted

## 2021-07-11 ENCOUNTER — Other Ambulatory Visit: Payer: Self-pay

## 2021-07-11 ENCOUNTER — Emergency Department (HOSPITAL_COMMUNITY)
Admission: EM | Admit: 2021-07-11 | Discharge: 2021-07-11 | Disposition: A | Discharge status: Home / Self Care | Payer: Medicare Other | Attending: Emergency Medicine | Admitting: Emergency Medicine

## 2021-07-11 DIAGNOSIS — R262 Difficulty in walking, not elsewhere classified: Secondary | ICD-10-CM

## 2021-07-11 DIAGNOSIS — Z5900 Homelessness unspecified: Secondary | ICD-10-CM | POA: Insufficient documentation

## 2021-07-11 DIAGNOSIS — K439 Ventral hernia without obstruction or gangrene: Secondary | ICD-10-CM | POA: Insufficient documentation

## 2021-07-11 DIAGNOSIS — M79606 Pain in leg, unspecified: Secondary | ICD-10-CM | POA: Insufficient documentation

## 2021-07-11 DIAGNOSIS — R0782 Intercostal pain: Secondary | ICD-10-CM | POA: Insufficient documentation

## 2021-07-11 DIAGNOSIS — W19XXXA Unspecified fall, initial encounter: Secondary | ICD-10-CM | POA: Insufficient documentation

## 2021-07-11 DIAGNOSIS — M79642 Pain in left hand: Secondary | ICD-10-CM | POA: Diagnosis not present

## 2021-07-11 MED ORDER — HYDROCODONE-ACETAMINOPHEN 5-325 MG PO TABS
1.0000 | ORAL_TABLET | Freq: Once | ORAL | Status: AC
Start: 2021-07-11 — End: 2021-07-11
  Administered 2021-07-11: 1 via ORAL
  Filled 2021-07-11: qty 1

## 2021-07-11 NOTE — Progress Notes (Signed)
CSW contacted patients Adult Arts development officer [name] with [facility] [phone]). CSW left a message requesting a call back. Feliz Beam was supposed to be staffing with her supervisor to determine next steps with patients case. APS has not updated CSW on their plans.  ?

## 2021-07-11 NOTE — ED Provider Notes (Signed)
?[facility] ?Provider Note ? ? ?CSN: [id number] ?Arrival date & time: 07/11/21  7829 ? ?  ? ?History ? ?Chief Complaint  ?Patient presents with  ? Leg Pain  ? ? ?Angela Medina is a 71 y.o. female. ? ?This is a 71 y.o. female with significant medical history as below, including homeless, schizoaffective who presents to the ED with complaint of requesting transportation to [facility].  Patient is well-known to this emergency department.  Multiple visits over the past week with myriad of complaints.  Patient reports that she is friends [facility] they can take care of her.  She is unsure of the names these people and has not been in touch with him over the telephone.  She is requesting medical transport to [facility] so she can see them.  She is also requesting something to eat.  She has some chronic pain to her hands and legs unchanged from baseline otherwise has no significant acute complaints today. ? ? ?Past Medical History: ?No date: Aneurysm ([place]) ?No date: Depression ?No date: Schizoaffective disorder ([place]) ?No date: Vision changes ?    Comment:  right eye ? ?Past Surgical History: ?No date: FOOT SURGERY  ? ? ?The history is provided by the patient. No language interpreter was used.  ?Leg Pain ?Associated symptoms: no fever   ? ?  ? ?Home Medications ?Prior to Admission medications   ?Medication Sig Start Date End Date Taking? Authorizing Provider  ?mupirocin cream (BACTROBAN) 2 % Apply 1 application. topically 3 (three) times daily. 07/05/21   [provider]  ?sulfamethoxazole-trimethoprim (BACTRIM DS) 800-160 MG tablet Take 1 tablet by mouth 2 (two) times daily. 07/05/21   [provider]  ?   ? ?Allergies    ?Methotrexate derivatives and Penicillins   ? ?Review of Systems   ?Review of Systems  ?Constitutional:  Negative for chills and fever.  ?HENT:  Negative for facial swelling and trouble swallowing.   ?Eyes:  Negative for photophobia and  visual disturbance.  ?Respiratory:  Negative for cough and shortness of breath.   ?Cardiovascular:  Negative for chest pain and palpitations.  ?Gastrointestinal:  Negative for abdominal pain, nausea and vomiting.  ?Endocrine: Negative for polydipsia and polyuria.  ?Genitourinary:  Negative for difficulty urinating and hematuria.  ?Musculoskeletal:  Positive for arthralgias. Negative for gait problem and joint swelling.  ?Skin:  Negative for pallor and rash.  ?Neurological:  Negative for syncope and headaches.  ?Psychiatric/Behavioral:  Negative for agitation and confusion.   ? ?Physical Exam ?Updated Vital Signs ?BP 140/89 (BP Location: Left Arm)   Pulse 71   Temp 98.4 ?F (36.9 ?C) (Oral)   Resp 14   SpO2 100%  ?Physical Exam ?Vitals and nursing note reviewed.  ?Constitutional:   ?   General: She is not in acute distress. ?   Appearance: She is not toxic-appearing.  ?   Comments: Disheveled ? ?Dried food on scrub top  ?HENT:  ?   Head: Normocephalic and atraumatic.  ?   Right Ear: External ear normal.  ?   Left Ear: External ear normal.  ?   Nose: Nose normal.  ?   Mouth/Throat:  ?   Mouth: Mucous membranes are moist.  ?Eyes:  ?   General: No scleral icterus.    ?   Right eye: No discharge.     ?   Left eye: No discharge.  ?Cardiovascular:  ?   Rate and Rhythm: Normal rate and  regular rhythm.  ?   Pulses: Normal pulses.  ?   Heart sounds: Normal heart sounds.  ?Pulmonary:  ?   Effort: Pulmonary effort is normal. No respiratory distress.  ?   Breath sounds: Normal breath sounds. No wheezing.  ?Abdominal:  ?   General: Abdomen is flat.  ?   Palpations: Abdomen is soft.  ?   Tenderness: There is no abdominal tenderness.  ?Musculoskeletal:     ?   General: Normal range of motion.  ?   Cervical back: Normal range of motion.  ?   Right lower leg: No edema.  ?   Left lower leg: No edema.  ?Skin: ?   General: Skin is warm and dry.  ?   Capillary Refill: Capillary refill takes less than 2 seconds.  ?Neurological:  ?    Mental Status: She is alert.  ?Psychiatric:     ?   Mood and Affect: Mood normal.     ?   Behavior: Behavior normal. Behavior is cooperative.  ? ? ?ED Results / Procedures / Treatments   ?Labs ?(all labs ordered are listed, but only abnormal results are displayed) ?Labs Reviewed - No data to display ? ?EKG ?None ? ?Radiology ?DG Wrist Complete Left ? ?Result Date: 07/10/2021 ?CLINICAL DATA:  fall EXAM: LEFT HAND - COMPLETE 3+ VIEW; LEFT WRIST - COMPLETE 3+ VIEW COMPARISON:  None. FINDINGS: Left hand: There is no evidence of fracture or dislocation. First carpometacarpal moderate to severe degenerative changes. Interphalangeal joint degenerative changes. Soft tissues are unremarkable. Left wrist: No evidence of fracture, dislocation, or joint effusion. No evidence of severe arthropathy. No aggressive appearing focal bone abnormality. Soft tissues are unremarkable. IMPRESSION: No acute displaced fracture or dislocation of the left hand and wrist. Electronically Signed   By: Iven Finn M.D.   On: 07/10/2021 20:48  ? ?DG Hand Complete Left ? ?Result Date: 07/10/2021 ?CLINICAL DATA:  fall EXAM: LEFT HAND - COMPLETE 3+ VIEW; LEFT WRIST - COMPLETE 3+ VIEW COMPARISON:  None. FINDINGS: Left hand: There is no evidence of fracture or dislocation. First carpometacarpal moderate to severe degenerative changes. Interphalangeal joint degenerative changes. Soft tissues are unremarkable. Left wrist: No evidence of fracture, dislocation, or joint effusion. No evidence of severe arthropathy. No aggressive appearing focal bone abnormality. Soft tissues are unremarkable. IMPRESSION: No acute displaced fracture or dislocation of the left hand and wrist. Electronically Signed   By: Iven Finn M.D.   On: 07/10/2021 20:48   ? ?Procedures ?Procedures  ? ? ?Medications Ordered in ED ?Medications - No data to display ? ?ED Course/ Medical Decision Making/ A&P ?  ?                        ?Medical Decision Making ? ?Initial Impression  and Ddx ?This patient presents to the Emergency Department for the above complaint. This involves an extensive number of treatment options and is a complaint that carries with it a high risk of complications and morbidity. Vital signs were reviewed.  ? ?Serious etiologies considered.  ? ?Patient PMH that increases complexity of ED encounter: Homeless, psychiatric history ? ?Social determinants of health include -homeless ? ?Previous records obtained and reviewed  ? ? ?Patient Reassessment and Ultimate Disposition/Management ? ? ?  ? ?Patient homeless, schizophrenia history.  She is requesting a ride to [facility].  D/w SW, open APS case on pt. Working on potential guardianship. She has sister in [place] that patient reports "  doesn't want anything to do with me." She is asking to go to [place] [place] by medical transport but does not where she would be going in [place] or who she would be seeing; wants to go to "see if she can get some help" at that location. Is also requesting an uber ride to "a real hospital." Advised we would be unable to provide these services. ? ?Pt given food. Tolerated well. Offered change of clothes as her are soiled and she said I/the Probation officer was "an asshole" and did not want the clothes. Pt also shouting profanity at nursing staff when they tried to help her into wheelchair.  ? ?Will plan to Dc the patient with o/p f/u, s/w will f/u regarding APS case. ? ?The patient improved significantly and was discharged in stable condition. Detailed discussions were had with the patient regarding current findings, and need for close f/u with PCP or on call doctor. The patient has been instructed to return immediately if the symptoms worsen in any way for re-evaluation. Patient verbalized understanding and is in agreement with current care plan. All questions answered prior to discharge. ? ? ? ? ? ? ? ? ? ? ? ? ? ? ? ? ? ? ? ? ? ? ? ?Complexity of Problems Addressed ?Acute illness or injury that  poses threat of life of bodily function ? ?Additional Data Reviewed and Analyzed ?Further history obtained from: ?Further history from spouse/family member, Past medical history and medications listed in the EMR,

## 2021-07-11 NOTE — ED Provider Triage Note (Signed)
Emergency Medicine Provider Triage Evaluation Note ? ?Angela Combesebecca S Medina , a 71 y.o. female  was evaluated in triage.  [facility]  Says that she saw social work yesterday and they did not help her and that the "lady's badge did not look like her face." ? ?Review of Systems  ?Pain all over ? ?Physical Exam  ?BP (!) 143/103   Pulse 72   Temp 98.5 ?F (36.9 ?C)   Resp 14   SpO2 100%  ?Gen:   Awake, no distress   ?Resp:  Normal effort  ?MSK:   Moves extremities without difficulty  ?Other:  Patient very disheveled ? ?Medical Decision Making  ?Medically screening exam initiated at 10:07 AM.  Appropriate orders placed.  Angela Medina was informed that the remainder of the evaluation will be completed by another provider, this initial triage assessment does not replace that evaluation, and the importance of remaining in the ED until their evaluation is complete. ? ? ?  ?Saddie BendersRedwine, Remington Skalsky A, PA-C ?07/11/21 1008 ? ?

## 2021-07-11 NOTE — Congregational Nurse Program (Signed)
?  Dept: [phone] ? ? ?Congregational Nurse Program Note ? ?Date of Encounter: 07/11/2021 ? ?Past Medical History: ?Past Medical History:  ?Diagnosis Date  ? Aneurysm (HCC)   ? Depression   ? Schizoaffective disorder (HCC)   ? Vision changes   ? right eye  ? ? ?Encounter Details: ? CNP Questionnaire - 07/11/21 1339   ? ?  ? Questionnaire  ? Do you give verbal consent to treat you today? Yes   ? Location Patient Served  [facility]   ? Visit Setting Church or Organization   ? Patient Status Homeless   ? Insurance Medicare   ? Insurance Referral N/A   ? Medication N/A   ? Medical Provider No   ? Screening Referrals N/A   ? Medical Referral Other   ? Medical Appointment Made N/A   ? Food N/A   ? Transportation N/A   ? Housing/Utilities No permanent housing   ? Interpersonal Safety N/A   ? Intervention Support   ? ED Visit Averted N/A   ? Life-Saving Intervention Made N/A   ? ?  ?  ? ?  ? ?Client seen lying in floor of [facility] lobby. Writer went to check on client. She was alert and inquiring about her mail. She said she was having pain in her ribs and lt side. Told client she should not move until help arrived. Another client reported that EMS had already been called. Stayed with client until EMS arrived and offered to contact family or friend. She declined.  ?Omri Bertran W RN CN ? ? ?

## 2021-07-11 NOTE — ED Provider Notes (Signed)
?[facility] EMERGENCY DEPT ?Provider Note ? ? ?CSN: [id number] ?Arrival date & time: 07/11/21  1404 ? ?  ? ?History ? ?Chief Complaint  ?Patient presents with  ? Fall  ? Homeless  ? ? ?Angela Medina is a 71 y.o. female. ? ?Pt is a 71 yo female with a hx of schizoaffective d/o and homeless status.  She is well known to the ED for her daily visits.  I just saw her last night at [facility].  Pt spent the night in the waiting room last night after d/c and checked back in this morning.  She was seen by the sw as well.  SW contacted APS again.  She left around noon and went to the [facility].  While there, she was found on the ground.  She said she fell again.  IRC called EMS who brought her here. Pt is supposed to walk with a walker and has been given 4 walkers just this week.  She denies any new pain now.  She wants to be sent from here to a SNF so they can take care of her.   ? ? ?  ? ?Home Medications ?Prior to Admission medications   ?Medication Sig Start Date End Date Taking? Authorizing Provider  ?mupirocin cream (BACTROBAN) 2 % Apply 1 application. topically 3 (three) times daily. 07/05/21   [provider]  ?sulfamethoxazole-trimethoprim (BACTRIM DS) 800-160 MG tablet Take 1 tablet by mouth 2 (two) times daily. 07/05/21   [provider]  ?   ? ?Allergies    ?Methotrexate derivatives and Penicillins   ? ?Review of Systems   ?Review of Systems  ?All other systems reviewed and are negative. ? ?Physical Exam ?Updated Vital Signs ?BP (!) 123/99 (BP Location: Right Arm)   Pulse 66   Temp 98.1 ?F (36.7 ?C) (Oral)   Resp 20   Ht 5' (1.524 m)   Wt 61.2 kg   SpO2 97%   BMI 26.37 kg/m?  ?Physical Exam ?Vitals and nursing note reviewed.  ?Constitutional:   ?   Comments: Dirty clothes, poor hygiene  ?HENT:  ?   Head: Normocephalic and atraumatic.  ?   Right Ear: External ear normal.  ?   Left Ear: External ear normal.  ?   Nose: Nose normal.  ?   Mouth/Throat:  ?   Mouth: Mucous membranes are  moist.  ?   Pharynx: Oropharynx is clear.  ?Eyes:  ?   Extraocular Movements: Extraocular movements intact.  ?   Conjunctiva/sclera: Conjunctivae normal.  ?   Pupils: Pupils are equal, round, and reactive to light.  ?Cardiovascular:  ?   Rate and Rhythm: Normal rate and regular rhythm.  ?   Pulses: Normal pulses.  ?   Heart sounds: Normal heart sounds.  ?Pulmonary:  ?   Effort: Pulmonary effort is normal.  ?   Breath sounds: Normal breath sounds.  ?Abdominal:  ?   General: Abdomen is flat. Bowel sounds are normal.  ?   Palpations: Abdomen is soft.  ?   Hernia: A hernia is present. Hernia is present in the ventral area.  ?   Comments: Easily reducible and nontender hernia  ?Musculoskeletal:     ?   General: Normal range of motion.  ?   Cervical back: Normal range of motion and neck supple.  ?Skin: ?   General: Skin is warm.  ?   Capillary Refill: Capillary refill takes less than 2 seconds.  ?Neurological:  ?  General: No focal deficit present.  ?   Mental Status: She is alert and oriented to person, place, and time.  ?Psychiatric:     ?   Mood and Affect: Mood normal.     ?   Behavior: Behavior normal.  ? ? ?ED Results / Procedures / Treatments   ?Labs ?(all labs ordered are listed, but only abnormal results are displayed) ?Labs Reviewed - No data to display ? ?EKG ?None ? ?Radiology ?DG Wrist Complete Left ? ?Result Date: 07/10/2021 ?CLINICAL DATA:  fall EXAM: LEFT HAND - COMPLETE 3+ VIEW; LEFT WRIST - COMPLETE 3+ VIEW COMPARISON:  None. FINDINGS: Left hand: There is no evidence of fracture or dislocation. First carpometacarpal moderate to severe degenerative changes. Interphalangeal joint degenerative changes. Soft tissues are unremarkable. Left wrist: No evidence of fracture, dislocation, or joint effusion. No evidence of severe arthropathy. No aggressive appearing focal bone abnormality. Soft tissues are unremarkable. IMPRESSION: No acute displaced fracture or dislocation of the left hand and wrist.  Electronically Signed   By: Tish Frederickson M.D.   On: 07/10/2021 20:48  ? ?DG Hand Complete Left ? ?Result Date: 07/10/2021 ?CLINICAL DATA:  fall EXAM: LEFT HAND - COMPLETE 3+ VIEW; LEFT WRIST - COMPLETE 3+ VIEW COMPARISON:  None. FINDINGS: Left hand: There is no evidence of fracture or dislocation. First carpometacarpal moderate to severe degenerative changes. Interphalangeal joint degenerative changes. Soft tissues are unremarkable. Left wrist: No evidence of fracture, dislocation, or joint effusion. No evidence of severe arthropathy. No aggressive appearing focal bone abnormality. Soft tissues are unremarkable. IMPRESSION: No acute displaced fracture or dislocation of the left hand and wrist. Electronically Signed   By: Tish Frederickson M.D.   On: 07/10/2021 20:48   ? ?Procedures ?Procedures  ? ? ?Medications Ordered in ED ?Medications  ?HYDROcodone-acetaminophen (NORCO/VICODIN) 5-325 MG per tablet 1 tablet (has no administration in time range)  ? ? ?ED Course/ Medical Decision Making/ A&P ?  ?                        ?Medical Decision Making ?Risk ?Prescription drug management. ? ? ?This patient presents to the ED for concern of snf placement, this involves an extensive number of treatment options, and is a complaint that carries with it a high risk of complications and morbidity.  The differential diagnosis includes snf placement ? ? ?Co morbidities that complicate the patient evaluation ? ?Schizoaffective d/o and homeless status ? ? ?Additional history obtained: ? ?Additional history obtained from epic chart review ? ?Problem List / ED Course: ? ?Ambulatory dysfunction:  SW working with APS to get pt the appropriate ID so she can go to a SNF.  There is nothing new I can help with now.  Pt will need to go back to [facility]. ? ? ? ?Social Determinants of Health: ? ?homeless ? ? ?Dispostion: ? ?After consideration of the diagnostic results and the patients response to treatment, I feel that the patent would benefit from  discharge.   ? ? ? ? ? ? ? ?Final Clinical Impression(s) / ED Diagnoses ?Final diagnoses:  ?Homeless  ?Ambulatory dysfunction  ? ? ?Rx / DC Orders ?ED Discharge Orders   ? ? None  ? ?  ? ? ?  ?Jacalyn Lefevre, MD ?07/11/21 1819 ? ?

## 2021-07-11 NOTE — ED Notes (Signed)
Pt verbalizes understanding of discharge instructions. Pt became verbally abusive with staff during this time. Opportunity for questions and answers were provided. Pt discharged from the ED.   ?

## 2021-07-11 NOTE — Progress Notes (Signed)
CSW reached back out to [facility] director Gretta Cool to find out if APS program manager responded to him. CSW was told no and that an VM was left with the APS program manager. Patient comes to the ED almost every day. CSW unable to provide long term care due to patient not qualifying for full medicaid. Patient also does not have an ID to cash her 6,000 check from her sisters will. TOC will continue to reach out to APS in regards their plan for patient. CSW has not heard back from patients APS worker today.  ?

## 2021-07-11 NOTE — Social Work (Signed)
[facility]W noted that Pt was at [facility]. ?CSW called APS worker, [name] @ [phone] to update about situation. No answer, left HIPAA compliant voicemail. ?

## 2021-07-11 NOTE — Discharge Instructions (Signed)
It was a pleasure caring for you today in the emergency department. ° °Please return to the emergency department for any worsening or worrisome symptoms. ° ° °

## 2021-07-11 NOTE — ED Triage Notes (Signed)
Patient arrives with complaints hand discomfort and rib cage pain due to a fall this morning.  ?Patient states that she has difficulty walking on a regular day.  ?[facility] for the same.  ?

## 2021-07-11 NOTE — Progress Notes (Signed)
[facility] [employer] Brooks. TOC director recommended that patient be asked if she would use her 6,000 check to stay at an assisted living facility. CSW was told that once patient is at the facility then staff can help patient apply for medicaid once she spends her 6,000 check down to see if she would qualify for medicaid. [facility] who would be obtaining patients birth certificate and other documents needed for a new ID. Patient would not be able to cash or use her check without proper ID. TOC director stated APS could assist with that. CSW explained that APS has not updated or called CSW back about patients care. TOC director stated he would reach out to the APS Dietitianprogram manager.  ?

## 2021-07-11 NOTE — ED Triage Notes (Signed)
Pt. Stated, My leg and hand is hurting its been damaged. ?

## 2021-07-12 ENCOUNTER — Telehealth: Payer: Self-pay

## 2021-07-12 NOTE — Telephone Encounter (Signed)
CSW received a call from patients APS worker, [employer]. [name] stated that she is trying to get patient access to her money and get her a new social security card. [name] stated she tried to see if she could use the sisters [name](Angela Frye) address but the sister refused and stated she did not want to be involved. [name] stated she can use the APS address and call social security with patient. [name] stated she will try to find patient today and help with getting her a new ID. [name] stated she would keep CSW updated.  ?

## 2021-07-13 ENCOUNTER — Emergency Department (HOSPITAL_COMMUNITY): Payer: Medicare Other

## 2021-07-13 ENCOUNTER — Emergency Department (HOSPITAL_COMMUNITY)
Admission: EM | Admit: 2021-07-13 | Discharge: 2021-07-13 | Disposition: A | Discharge status: Home / Self Care | Payer: Medicare Other | Attending: Emergency Medicine | Admitting: Emergency Medicine

## 2021-07-13 ENCOUNTER — Emergency Department (HOSPITAL_COMMUNITY)
Admission: EM | Admit: 2021-07-13 | Discharge: 2021-07-13 | Discharge status: Against Medical Advice | Payer: Medicare Other

## 2021-07-13 ENCOUNTER — Encounter (HOSPITAL_COMMUNITY): Payer: Self-pay

## 2021-07-13 ENCOUNTER — Other Ambulatory Visit: Payer: Self-pay

## 2021-07-13 DIAGNOSIS — S7002XA Contusion of left hip, initial encounter: Secondary | ICD-10-CM | POA: Insufficient documentation

## 2021-07-13 DIAGNOSIS — S20229A Contusion of unspecified back wall of thorax, initial encounter: Secondary | ICD-10-CM | POA: Diagnosis not present

## 2021-07-13 DIAGNOSIS — S0093XA Contusion of unspecified part of head, initial encounter: Secondary | ICD-10-CM | POA: Insufficient documentation

## 2021-07-13 DIAGNOSIS — W19XXXA Unspecified fall, initial encounter: Secondary | ICD-10-CM

## 2021-07-13 DIAGNOSIS — S0990XA Unspecified injury of head, initial encounter: Secondary | ICD-10-CM | POA: Diagnosis present

## 2021-07-13 DIAGNOSIS — W01198A Fall on same level from slipping, tripping and stumbling with subsequent striking against other object, initial encounter: Secondary | ICD-10-CM | POA: Insufficient documentation

## 2021-07-13 MED ORDER — HYDROMORPHONE HCL 1 MG/ML IJ SOLN
1.0000 mg | Freq: Once | INTRAMUSCULAR | Status: AC
Start: 2021-07-13 — End: 2021-07-13
  Administered 2021-07-13: 1 mg via INTRAMUSCULAR
  Filled 2021-07-13: qty 1

## 2021-07-13 MED ORDER — IBUPROFEN 800 MG PO TABS
800.0000 mg | ORAL_TABLET | Freq: Three times a day (TID) | ORAL | 0 refills | Status: DC | PRN
Start: 2021-07-13 — End: 2021-08-03

## 2021-07-13 NOTE — Progress Notes (Signed)
CSW received a text message from patients APS worker [name] who stated she will try to get to the hospital before patient is discharged.  ?

## 2021-07-13 NOTE — ED Notes (Signed)
Pt verbally stating to EMS and ED staff that writer "cussed me out" no profanity has or had been used toward pt. EMS has been with pt the whole time. Writer confronted pt to clarify and she had no response. ?

## 2021-07-13 NOTE — ED Provider Notes (Signed)
?[facility]-EMERGENCY DEPT ?Provider Note ? ? ?CSN: [id number] ?Arrival date & time: 07/13/21  4098 ? ?  ? ?History ? ?No chief complaint on file. ? ? ?Angela Medina is a 71 y.o. female. ? ?Patient states she fell today hit her head and her back and her left hip.  No past medical history ? ?The history is provided by the patient and medical records. No language interpreter was used.  ?Fall ?This is a new problem. The current episode started 6 to 12 hours ago. The problem occurs rarely. The problem has been resolved. Pertinent negatives include no chest pain, no abdominal pain and no headaches. Nothing aggravates the symptoms. Nothing relieves the symptoms. She has tried nothing for the symptoms.  ? ?  ? ?Home Medications ?Prior to Admission medications   ?Medication Sig Start Date End Date Taking? Authorizing Provider  ?ibuprofen (ADVIL) 800 MG tablet Take 1 tablet (800 mg total) by mouth every 8 (eight) hours as needed for moderate pain. 07/13/21  Yes Bethann Berkshire, MD  ?mupirocin cream (BACTROBAN) 2 % Apply 1 application. topically 3 (three) times daily. 07/05/21   [provider]  ?sulfamethoxazole-trimethoprim (BACTRIM DS) 800-160 MG tablet Take 1 tablet by mouth 2 (two) times daily. 07/05/21   [provider]  ?   ? ?Allergies    ?Methotrexate derivatives and Penicillins   ? ?Review of Systems   ?Review of Systems  ?Constitutional:  Negative for appetite change and fatigue.  ?HENT:  Negative for congestion, ear discharge and sinus pressure.   ?     Headache  ?Eyes:  Negative for discharge.  ?Respiratory:  Negative for cough.   ?Cardiovascular:  Negative for chest pain.  ?Gastrointestinal:  Negative for abdominal pain and diarrhea.  ?Genitourinary:  Negative for frequency and hematuria.  ?Musculoskeletal:  Negative for back pain.  ?     Left hip and lower back pain  ?Skin:  Negative for rash.  ?Neurological:  Negative for seizures and headaches.  ?Psychiatric/Behavioral:   Negative for hallucinations.   ? ?Physical Exam ?Updated Vital Signs ?BP 126/80 (BP Location: Left Arm)   Pulse 64   Temp 97.7 ?F (36.5 ?C) (Oral)   Resp 14   Ht 5' (1.524 m)   Wt 61.2 kg   SpO2 99%   BMI 26.37 kg/m?  ?Physical Exam ?Vitals and nursing note reviewed.  ?Constitutional:   ?   Appearance: She is well-developed.  ?HENT:  ?   Head: Normocephalic.  ?   Nose: Nose normal.  ?Eyes:  ?   General: No scleral icterus. ?   Conjunctiva/sclera: Conjunctivae normal.  ?Neck:  ?   Thyroid: No thyromegaly.  ?Cardiovascular:  ?   Rate and Rhythm: Normal rate and regular rhythm.  ?   Heart sounds: No murmur heard. ?  No friction rub. No gallop.  ?Pulmonary:  ?   Breath sounds: No stridor. No wheezing or rales.  ?Chest:  ?   Chest wall: No tenderness.  ?Abdominal:  ?   General: There is no distension.  ?   Tenderness: There is no abdominal tenderness. There is no rebound.  ?Musculoskeletal:  ?   Cervical back: Neck supple.  ?   Comments: Tenderness to posterior head, left hip and lumbar spine  ?Lymphadenopathy:  ?   Cervical: No cervical adenopathy.  ?Skin: ?   Findings: No erythema or rash.  ?Neurological:  ?   Mental Status: She is alert and oriented to person, place, and time.  ?  Motor: No abnormal muscle tone.  ?   Coordination: Coordination normal.  ?Psychiatric:     ?   Behavior: Behavior normal.  ? ? ?ED Results / Procedures / Treatments   ?Labs ?(all labs ordered are listed, but only abnormal results are displayed) ?Labs Reviewed - No data to display ? ?EKG ?None ? ?Radiology ?DG Chest 2 View ? ?Result Date: 07/13/2021 ?CLINICAL DATA:  71 year old female with a history of pain EXAM: CHEST - 2 VIEW COMPARISON:  Multiple prior most recent 07/06/2021 FINDINGS: Cardiomediastinal silhouette unchanged in size and contour. No interlobular septal thickening. No central vascular congestion. No pneumothorax or pleural effusion. No new confluent airspace disease. Similar appearance of mid/lower thoracic compression  fracture, with osteopenia. No displaced rib fracture identified IMPRESSION: Negative for acute cardiopulmonary disease. Redemonstration of mid/lower thoracic compression fracture Electronically Signed   By: Gilmer Mor D.O.   On: 07/13/2021 09:59  ? ?DG Lumbar Spine Complete ? ?Result Date: 07/13/2021 ?CLINICAL DATA:  71 year old female with a history of fall EXAM: LUMBAR SPINE - COMPLETE 4+ VIEW COMPARISON:  None. FINDINGS: Osteopenia. Right apex scoliotic curvature of the lumbar spine. No available comparison. Oblique images demonstrate no displaced pars defect. Vertebral body heights maintained. No acute displaced fracture identified. Disc disease present throughout the lumbar spine with endplate changes and anterior osteophyte production. Facet hypertrophy worst in the lower lumbar levels. Vascular calcifications IMPRESSION: No radiographic evidence of acute fracture of the lumbar spine. Multilevel degenerative changes and rightward scoliotic curvature Electronically Signed   By: Gilmer Mor D.O.   On: 07/13/2021 09:55  ? ?CT Head Wo Contrast ? ?Result Date: 07/13/2021 ?CLINICAL DATA:  71 year old female with a history of head trauma EXAM: CT HEAD WITHOUT CONTRAST CT CERVICAL SPINE WITHOUT CONTRAST TECHNIQUE: Multidetector CT imaging of the head and cervical spine was performed following the standard protocol without intravenous contrast. Multiplanar CT image reconstructions of the cervical spine were also generated. RADIATION DOSE REDUCTION: This exam was performed according to the departmental dose-optimization program which includes automated exposure control, adjustment of the mA and/or kV according to patient size and/or use of iterative reconstruction technique. COMPARISON:  07/06/2021 FINDINGS: CT HEAD FINDINGS Brain: No acute intracranial hemorrhage. No midline shift or mass effect. Gray-white differentiation maintained. Patchy hypodensity in the bilateral periventricular white matter, unchanged from  the comparison. Unremarkable appearance of the ventricular system. 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  No aggressive bone lesion identified. Sinuses/Orbits: Unremarkable appearance of the orbits. Mastoid air cells clear. No middle ear effusion. No significant sinus disease. CT CERVICAL SPINE FINDINGS Cervical Spine: Within the limits of the examination secondary to motion artifact, the alignment of the cervical spine is relatively unchanged from the recent comparison of 07/06/2021. Kyphotic deformity centered at the C4 level. Trace anterolisthesis of C2 on C3. 5 mm of anterolisthesis of C3 on C4. 3 mm of anterolisthesis of C4 on C5. 3 mm of anterolisthesis of C7 on T1 Within the limits of examination secondary to motion artifact, the configuration of the vertebral body heights is unchanged when compared to the prior CT, with 50% height loss at C4, C5. Within the limits of exam secondary to motion artifact, there is no displaced fracture identified. Similar appearance of advanced disc disease throughout the cervical spine with disc space narrowing, endplate sclerosis, uncovertebral joint disease. Associated facet disease throughout the cervical spine, similar to comparison studies. Advanced disease results in varying degrees of  bilateral neural foraminal narrowing throughout the cervical spine. Unremarkable appearance of the lung apices. Vascular calcifications. No evidence of canal hematoma. IMPRESSION: Head CT: Head CT negative for acute finding. Similar appearance of chronic white matter disease and associated intracranial atherosclerosis. Cervical CT: Study somewhat limited by significant motion artifact, however, no displaced fracture identified and the alignment and advanced degenerative changes are relatively similar to serial recent cervical CT studies. If there is ongoing concern for possible occult  fracture, either a repeat CT or MRI would be needed. Electronically Signed   By: Gilmer Mor D.O.   On: 07/13/2021 09:49  ? ?CT Cervical Spine Wo Contrast ? ?Result Date: 07/13/2021 ?CLINICAL DATA:  71 year old

## 2021-07-13 NOTE — ED Triage Notes (Signed)
Patient states that she does not want to be seen. Patient reports that she has money for a cab and wants to leave. ?

## 2021-07-13 NOTE — ED Triage Notes (Signed)
[place] [facility] [place]. Patient c/o left hip and leg pain along with some neck pain. Patient states she recently fell and was unable to get up. Denies LOC. ?

## 2021-07-13 NOTE — ED Triage Notes (Signed)
Per EMS, patient from [facility], was transported to [facility] from ED via cab voucher and states she wanted to go to [address] instead. Asked EMS to take her to the hospital or [facility]. States fall since d/c with pain to L foot and neck. ?

## 2021-07-13 NOTE — ED Notes (Signed)
Upon triage, patient states she does not want to be seen. States she wants to take a cab. Cab called for patient. ?

## 2021-07-13 NOTE — ED Notes (Signed)
Patient transported to CT 

## 2021-07-13 NOTE — Discharge Instructions (Signed)
Follow up if not improved

## 2021-07-13 NOTE — ED Notes (Signed)
As I was doing my round to check on the pt, ask the pt was she okay and she say no because is in pain that she wanted the door open pt was told that we have to keep the door closes because of [place]. I was told by the pt To [place]. Nurse has been Notify. ?

## 2021-07-14 ENCOUNTER — Emergency Department (HOSPITAL_COMMUNITY)
Admission: EM | Admit: 2021-07-14 | Discharge: 2021-07-14 | Disposition: A | Discharge status: Home / Self Care | Payer: Medicare Other | Attending: Emergency Medicine | Admitting: Emergency Medicine

## 2021-07-14 ENCOUNTER — Other Ambulatory Visit: Payer: Self-pay

## 2021-07-14 ENCOUNTER — Encounter (HOSPITAL_COMMUNITY): Payer: Self-pay

## 2021-07-14 DIAGNOSIS — M791 Myalgia, unspecified site: Secondary | ICD-10-CM | POA: Diagnosis present

## 2021-07-14 DIAGNOSIS — Z5900 Homelessness unspecified: Secondary | ICD-10-CM | POA: Diagnosis not present

## 2021-07-14 DIAGNOSIS — M2569 Stiffness of other specified joint, not elsewhere classified: Secondary | ICD-10-CM | POA: Insufficient documentation

## 2021-07-14 DIAGNOSIS — E876 Hypokalemia: Secondary | ICD-10-CM | POA: Insufficient documentation

## 2021-07-14 LAB — CBC WITH DIFFERENTIAL/PLATELET
Abs Immature Granulocytes: 0.08 10*3/uL — ABNORMAL HIGH (ref 0.00–0.07)
Basophils Absolute: 0.1 10*3/uL (ref 0.0–0.1)
Basophils Relative: 1 %
Eosinophils Absolute: 0.2 10*3/uL (ref 0.0–0.5)
Eosinophils Relative: 2 %
HCT: 41.5 % (ref 36.0–46.0)
Hemoglobin: 13.7 g/dL (ref 12.0–15.0)
Immature Granulocytes: 1 %
Lymphocytes Relative: 20 %
Lymphs Abs: 2.3 10*3/uL (ref 0.7–4.0)
MCH: 32.2 pg (ref 26.0–34.0)
MCHC: 33 g/dL (ref 30.0–36.0)
MCV: 97.4 fL (ref 80.0–100.0)
Monocytes Absolute: 1.1 10*3/uL — ABNORMAL HIGH (ref 0.1–1.0)
Monocytes Relative: 9 %
Neutro Abs: 7.9 10*3/uL — ABNORMAL HIGH (ref 1.7–7.7)
Neutrophils Relative %: 67 %
Platelets: 391 10*3/uL (ref 150–400)
RBC: 4.26 MIL/uL (ref 3.87–5.11)
RDW: 12.9 % (ref 11.5–15.5)
WBC: 11.7 10*3/uL — ABNORMAL HIGH (ref 4.0–10.5)
nRBC: 0 % (ref 0.0–0.2)

## 2021-07-14 LAB — BASIC METABOLIC PANEL
Anion gap: 8 (ref 5–15)
BUN: 15 mg/dL (ref 8–23)
CO2: 32 mmol/L (ref 22–32)
Calcium: 8.5 mg/dL — ABNORMAL LOW (ref 8.9–10.3)
Chloride: 97 mmol/L — ABNORMAL LOW (ref 98–111)
Creatinine, Ser: 0.8 mg/dL (ref 0.44–1.00)
GFR, Estimated: >60 mL/min (ref >60)
Glucose, Bld: 119 mg/dL — ABNORMAL HIGH (ref 70–99)
Potassium: 3.2 mmol/L — ABNORMAL LOW (ref 3.5–5.1)
Sodium: 137 mmol/L (ref 135–145)

## 2021-07-14 MED ORDER — POTASSIUM CHLORIDE CRYS ER 20 MEQ PO TBCR
40.0000 meq | EXTENDED_RELEASE_TABLET | Freq: Once | ORAL | Status: AC
  Administered 2021-07-14: 40 meq via ORAL
  Filled 2021-07-14: qty 2

## 2021-07-14 MED ORDER — LORAZEPAM 1 MG PO TABS
1.0000 mg | ORAL_TABLET | Freq: Once | ORAL | Status: DC
  Filled 2021-07-14: qty 1

## 2021-07-14 NOTE — ED Provider Notes (Signed)
?[place] ?Provider Note ? ? ?CSN: [id number] ?Arrival date & time: 07/14/21  2004 ? ?  ? ?History ? ?Chief Complaint  ?Patient presents with  ? pain  ? ? ?Angela Medina is a 71 y.o. female who is well-known to the emergency department.  She has a history of schizoaffective disorder and homelessness.  She frequents emergency departments in the [facility] sometimes several times a day and was seen yesterday.  Today she comes in with complaint of body pain and stiffness.  Patient states that she thinks she was poisoned today while eating food.  She thinks she may actually have food poisoning because she ate canned chicken noodle soup.  She states that her body begins stiffening and then she went into convulsions is now having pain and stiffness in her legs which is new and having difficulty extending her limbs.  She does not take any antipsychotic medications at this time. ?HPI ? ?  ? ?Home Medications ?Prior to Admission medications   ?Medication Sig Start Date End Date Taking? Authorizing Provider  ?ibuprofen (ADVIL) 800 MG tablet Take 1 tablet (800 mg total) by mouth every 8 (eight) hours as needed for moderate pain. 07/13/21   Milton Ferguson, MD  ?mupirocin cream (BACTROBAN) 2 % Apply 1 application. topically 3 (three) times daily. 07/05/21   [provider]  ?sulfamethoxazole-trimethoprim (BACTRIM DS) 800-160 MG tablet Take 1 tablet by mouth 2 (two) times daily. 07/05/21   [provider]  ?   ? ?Allergies    ?Methotrexate derivatives and Penicillins   ? ?Review of Systems   ?Review of Systems ? ?Physical Exam ?Updated Vital Signs ?BP (!) 143/96   Pulse 78   Temp 98.2 ?F (36.8 ?C) (Oral)   Resp 14   SpO2 97%  ?Physical Exam ?Vitals and nursing note reviewed.  ?Constitutional:   ?   General: She is not in acute distress. ?   Appearance: She is well-developed. She is not diaphoretic.  ?   Comments: Unkempt, disheveled with severely matted hair  on the top of her head.  Fetid smell  ?HENT:  ?   Head: Normocephalic and atraumatic.  ?   Right Ear: External ear normal.  ?   Left Ear: External ear normal.  ?   Nose: Nose normal.  ?   Mouth/Throat:  ?   Mouth: Mucous membranes are moist.  ?Eyes:  ?   General: No scleral icterus. ?   Conjunctiva/sclera: Conjunctivae normal.  ?Cardiovascular:  ?   Rate and Rhythm: Normal rate and regular rhythm.  ?   Heart sounds: Normal heart sounds. No murmur heard. ?  No friction rub. No gallop.  ?Pulmonary:  ?   Effort: Pulmonary effort is normal. No respiratory distress.  ?   Breath sounds: Normal breath sounds.  ?Abdominal:  ?   General: Bowel sounds are normal. There is no distension.  ?   Palpations: Abdomen is soft. There is no mass.  ?   Tenderness: There is no abdominal tenderness. There is no guarding.  ?Musculoskeletal:  ?   Cervical back: Normal range of motion.  ?   Comments: Weakness of the bilateral upper extremities.  Pain with movement of the lower extremities and severe spasm and cramping with movement.  ?Skin: ?   General: Skin is warm and dry.  ?Neurological:  ?   Mental Status: She is alert and oriented to person, place, and time.  ?Psychiatric:     ?  Behavior: Behavior normal.  ? ? ?ED Results / Procedures / Treatments   ?Labs ?(all labs ordered are listed, but only abnormal results are displayed) ?Labs Reviewed  ?CBC WITH DIFFERENTIAL/PLATELET  ?BASIC METABOLIC PANEL  ? ? ?EKG ?None ? ?Radiology ?DG Chest 2 View ? ?Result Date: 07/13/2021 ?CLINICAL DATA:  71 year old female with a history of pain EXAM: CHEST - 2 VIEW COMPARISON:  Multiple prior most recent 07/06/2021 FINDINGS: Cardiomediastinal silhouette unchanged in size and contour. No interlobular septal thickening. No central vascular congestion. No pneumothorax or pleural effusion. No new confluent airspace disease. Similar appearance of mid/lower thoracic compression fracture, with osteopenia. No displaced rib fracture identified IMPRESSION:  Negative for acute cardiopulmonary disease. Redemonstration of mid/lower thoracic compression fracture Electronically Signed   By: Corrie Mckusick D.O.   On: 07/13/2021 09:59  ? ?DG Lumbar Spine Complete ? ?Result Date: 07/13/2021 ?CLINICAL DATA:  71 year old female with a history of fall EXAM: LUMBAR SPINE - COMPLETE 4+ VIEW COMPARISON:  None. FINDINGS: Osteopenia. Right apex scoliotic curvature of the lumbar spine. No available comparison. Oblique images demonstrate no displaced pars defect. Vertebral body heights maintained. No acute displaced fracture identified. Disc disease present throughout the lumbar spine with endplate changes and anterior osteophyte production. Facet hypertrophy worst in the lower lumbar levels. Vascular calcifications IMPRESSION: No radiographic evidence of acute fracture of the lumbar spine. Multilevel degenerative changes and rightward scoliotic curvature Electronically Signed   By: Corrie Mckusick D.O.   On: 07/13/2021 09:55  ? ?CT Head Wo Contrast ? ?Result Date: 07/13/2021 ?CLINICAL DATA:  71 year old female with a history of head trauma EXAM: CT HEAD WITHOUT CONTRAST CT CERVICAL SPINE WITHOUT CONTRAST TECHNIQUE: Multidetector CT imaging of the head and cervical spine was performed following the standard protocol without intravenous contrast. Multiplanar CT image reconstructions of the cervical spine were also generated. RADIATION DOSE REDUCTION: This exam was performed according to the departmental dose-optimization program which includes automated exposure control, adjustment of the mA and/or kV according to patient size and/or use of iterative reconstruction technique. COMPARISON:  07/06/2021 FINDINGS: CT HEAD FINDINGS Brain: No acute intracranial hemorrhage. No midline shift or mass effect. Gray-white differentiation maintained. Patchy hypodensity in the bilateral periventricular white matter, unchanged from the comparison. Unremarkable appearance of the ventricular system. Treatment  changes of aneurysm coiling in the right anterior circulation Vascular: Calcifications of the vasculature Skull: No acute fracture.  No aggressive bone lesion identified. Sinuses/Orbits: Unremarkable appearance of the orbits. Mastoid air cells clear. No middle ear effusion. No significant sinus disease. CT CERVICAL SPINE FINDINGS Cervical Spine: Within the limits of the examination secondary to motion artifact, the alignment of the cervical spine is relatively unchanged from the recent comparison of 07/06/2021. Kyphotic deformity centered at the C4 level. Trace anterolisthesis of C2 on C3. 5 mm of anterolisthesis of C3 on C4. 3 mm of anterolisthesis of C4 on C5. 3 mm of anterolisthesis of C7 on T1 Within the limits of examination secondary to motion artifact, the configuration of the vertebral body heights is unchanged when compared to the prior CT, with 50% height loss at C4, C5. Within the limits of exam secondary to motion artifact, there is no displaced fracture identified. Similar appearance of advanced disc disease throughout the cervical spine with disc space narrowing, endplate sclerosis, uncovertebral joint disease. Associated facet disease throughout the cervical spine, similar to comparison studies. Advanced disease results in varying degrees of bilateral neural foraminal narrowing throughout the cervical spine. Unremarkable appearance of the lung apices. Vascular calcifications.  No evidence of canal hematoma. IMPRESSION: Head CT: Head CT negative for acute finding. Similar appearance of chronic white matter disease and associated intracranial atherosclerosis. Cervical CT: Study somewhat limited by significant motion artifact, however, no displaced fracture identified and the alignment and advanced degenerative changes are relatively similar to serial recent cervical CT studies. If there is ongoing concern for possible occult fracture, either a repeat CT or MRI would be needed. Electronically Signed   By:  Corrie Mckusick D.O.   On: 07/13/2021 09:49  ? ?CT Cervical Spine Wo Contrast ? ?Result Date: 07/13/2021 ?CLINICAL DATA:  71 year old female with a history of head trauma EXAM: CT HEAD WITHOUT CONTRAST CT CERVICAL

## 2021-07-14 NOTE — ED Triage Notes (Signed)
Pt BIB [facility] c/o pain all over resulted from poisoned soup someone gave her. Pt stated she's now paralyzed from the soup. VSS ?

## 2021-07-14 NOTE — ED Notes (Signed)
Pt is up for discharge . This RN went over the discharge instructions with the pt. Pt started cursing at this RN, refused to leaved. Security at bedside to help escorting the pt out.  ?

## 2021-07-14 NOTE — Discharge Instructions (Signed)
Get help right away if you: Have chest pain. Have shortness of breath. Have vomiting or diarrhea that lasts for more than 2 days. Faint. 

## 2021-07-14 NOTE — ED Notes (Signed)
Pt refused to have her dc vitals taken. ?

## 2021-07-15 ENCOUNTER — Other Ambulatory Visit: Payer: Self-pay

## 2021-07-15 ENCOUNTER — Emergency Department (HOSPITAL_COMMUNITY)
Admission: EM | Admit: 2021-07-15 | Discharge: 2021-07-15 | Disposition: A | Discharge status: Home / Self Care | Payer: Medicare Other | Attending: Vascular Surgery | Admitting: Vascular Surgery

## 2021-07-15 ENCOUNTER — Encounter (HOSPITAL_COMMUNITY): Payer: Self-pay | Admitting: Emergency Medicine

## 2021-07-15 ENCOUNTER — Emergency Department (HOSPITAL_COMMUNITY)
Admission: EM | Admit: 2021-07-15 | Discharge: 2021-07-16 | Disposition: A | Discharge status: Home / Self Care | Payer: Medicare Other | Source: Home / Self Care | Attending: Emergency Medicine | Admitting: Emergency Medicine

## 2021-07-15 DIAGNOSIS — Z5900 Homelessness unspecified: Secondary | ICD-10-CM

## 2021-07-15 DIAGNOSIS — Z5982 Transportation insecurity: Secondary | ICD-10-CM | POA: Insufficient documentation

## 2021-07-15 DIAGNOSIS — Z659 Problem related to unspecified psychosocial circumstances: Secondary | ICD-10-CM

## 2021-07-15 DIAGNOSIS — R262 Difficulty in walking, not elsewhere classified: Secondary | ICD-10-CM

## 2021-07-15 DIAGNOSIS — Z609 Problem related to social environment, unspecified: Secondary | ICD-10-CM | POA: Diagnosis not present

## 2021-07-15 DIAGNOSIS — R5383 Other fatigue: Secondary | ICD-10-CM | POA: Diagnosis present

## 2021-07-15 DIAGNOSIS — Z748 Other problems related to care provider dependency: Secondary | ICD-10-CM

## 2021-07-15 MED ORDER — ALUM & MAG HYDROXIDE-SIMETH 200-200-20 MG/5ML PO SUSP: 30.0000 mL | Freq: Four times a day (QID) | ORAL | Status: DC | PRN

## 2021-07-15 MED ORDER — NICOTINE 21 MG/24HR TD PT24
21.0000 mg | MEDICATED_PATCH | Freq: Every day | TRANSDERMAL | Status: DC
  Filled 2021-07-15 (×2): qty 1

## 2021-07-15 MED ORDER — IBUPROFEN 400 MG PO TABS
600.0000 mg | ORAL_TABLET | Freq: Three times a day (TID) | ORAL | Status: DC | PRN
  Administered 2021-07-15: 600 mg via ORAL
  Filled 2021-07-15: qty 1

## 2021-07-15 NOTE — ED Triage Notes (Signed)
Pt with multiple ED visits and given multiple walkers by ED.  Discharged this morning and has been in lobby and outside ED.  States a taxi came to take her to a location she lives on the streets and it was a [employer] officer and she was unable to get in the [place].  States if it had been a car she would have been able to go.  Reports generalized weakness and difficulty ambulating with walker. States she has a Child psychotherapist that is attempting to find her a place to live.  Pt eating [place] sandwich and apple sauce on arrival to triage room. ?

## 2021-07-15 NOTE — Progress Notes (Addendum)
CSW contacted by ED secretary that patient has been in ED lobby over night. CSW spoke with lobby staff and was informed patient has been soliciting money from other patients for a cab voucher. CSW met with patient and noted request for voucher. CSW is aware of patient from high ED-utilization and not engaging in appropriate outpatient healthcare follow-up or working on appropriate housing arrangements. Patient has had 66 ED visits in the past twelve months. CSW advised patient she will provide her with a taxi voucher however patient in the future will need to make her own arrangements and plans in future ED visits.  ? ?12:40p: Patient was observed by CSW with NT assist being unable to get into a cab and is unlikely to be able to independently ambulate or transfer to walker or wheelchair without assist. Patient is not appropriate for cab. As patient is requesting discharge to her impromtu shelter outside of an abandoned business [facility] or [facility] is not appropriate as they will not discharge to the street. CSW discussed with RNCM and Charge RN they did not have any ideas options. ? ?CSW was later notified by charge RN there were eight police officers refusing to take patient to jail. CSW presented and noted they were called to trespass patient but based on her physical status noted they would likely return her to the hospital regardless. CSW discussed with on-call supervisor who is escalating it to TOC associate director. CSW notes likely need for LOG but would remain an incredibly difficult placement due to homeless and questionable physical needs. Charge RN did attempt to ambulate patient via walker under her supervision and patient was unable to take more than two to three steps. While patient has been reported to been observed ambulating independently it has not been evidenced today. CSW received call from RNCM that she spoke with APS case worker who planned on picking patient up tomorrow to take her to the social  security office and to get her an ID so they could pay for a motel for patient to stay while they work on the rest. CSW notes on paper this sounds appropriate but due to patient's ambulation today CSW is concerned it may not be sufficient long-term. CSW is waiting to hear back from director and current plan is to have DSS pick patient up on Monday while going ahead and escalating case to TOC leadership due to reasonable concern of continued return visits.  ?

## 2021-07-15 NOTE — Care Management (Addendum)
Spoke to [name] APS worker. Her plan is to find her and take her for a new ID and SSN tomorrow, then they can get her into a hotel. They have no emergency housing currently, shelters are not open today [facility] etc). D/W CSW patient is currently in the lobby, was DC from ED this AM at 0455 ?Checking  with adapt on a wheelchair for the patient, she just received a walker not too long ago. Seeing if she qualifies for wheelchair, she has Medicare.  Right now they are locked out of the system and will not be able to access it at this time. They will let us know if she qualifies. ?

## 2021-07-15 NOTE — ED Triage Notes (Signed)
Pt c/o fatigue, pt just dc from the ED tonight. ?

## 2021-07-15 NOTE — ED Notes (Signed)
Patient discharged this am and never left ED due to immobility. Both Care management and SW involved as well as [employer]. APS has been contacted and will come and assist patient tomorrow with housing, ID, etc. Patient needs to be boarded until that time. ?

## 2021-07-15 NOTE — ED Provider Notes (Signed)
?[facility] EMERGENCY DEPARTMENT ?Provider Note ? ? ?CSN: [id number] ?Arrival date & time: 07/15/21  1245 ? ?  ? ?History ? ?Chief Complaint  ?Patient presents with  ? Weakness  ? ? ?Angela Medina is a 71 y.o. female. ? ?Pt is a 71 yo female with schizoaffective d/o and homeless status.  She has been in the ED several times.  She is no longer welcome at the [facility].  She was here this am and was d/c and said she could not get in the car, so she checked her back in.  SW and APS working on getting pt an ID so she can collect her social security and get a place to stay.  Our charge nurse has spoken with APS.  Supposedly there is someone from APS coming tomorrow to take her to a hotel and to help her get an ID.  In the meantime, she will stay here as a boarder.  Pt has no new complaints.  She let the nurse change her clothes and clean her up. ? ? ?  ? ?Home Medications ?Prior to Admission medications   ?Medication Sig Start Date End Date Taking? Authorizing Provider  ?ibuprofen (ADVIL) 800 MG tablet Take 1 tablet (800 mg total) by mouth every 8 (eight) hours as needed for moderate pain. 07/13/21   Bethann Berkshire, MD  ?mupirocin cream (BACTROBAN) 2 % Apply 1 application. topically 3 (three) times daily. 07/05/21   [provider]  ?sulfamethoxazole-trimethoprim (BACTRIM DS) 800-160 MG tablet Take 1 tablet by mouth 2 (two) times daily. 07/05/21   [provider]  ?   ? ?Allergies    ?Methotrexate derivatives and Penicillins   ? ?Review of Systems   ?Review of Systems  ?All other systems reviewed and are negative. ? ?Physical Exam ?Updated Vital Signs ?BP 124/87 (BP Location: Right Arm)   Pulse 81   Temp 97.9 ?F (36.6 ?C) (Oral)   Resp 16   SpO2 96%  ?Physical Exam ?Vitals and nursing note reviewed.  ?Constitutional:   ?   Appearance: Normal appearance.  ?HENT:  ?   Head: Normocephalic and atraumatic.  ?   Right Ear: External ear normal.  ?   Left Ear: External ear normal.  ?   Nose:  Nose normal.  ?   Mouth/Throat:  ?   Mouth: Mucous membranes are moist.  ?   Pharynx: Oropharynx is clear.  ?Eyes:  ?   Extraocular Movements: Extraocular movements intact.  ?   Conjunctiva/sclera: Conjunctivae normal.  ?   Pupils: Pupils are equal, round, and reactive to light.  ?Cardiovascular:  ?   Rate and Rhythm: Normal rate and regular rhythm.  ?   Pulses: Normal pulses.  ?   Heart sounds: Normal heart sounds.  ?Pulmonary:  ?   Effort: Pulmonary effort is normal.  ?   Breath sounds: Normal breath sounds.  ?Abdominal:  ?   General: Abdomen is flat. Bowel sounds are normal.  ?   Palpations: Abdomen is soft.  ?Musculoskeletal:     ?   General: Normal range of motion.  ?   Cervical back: Normal range of motion and neck supple.  ?Skin: ?   General: Skin is warm.  ?   Capillary Refill: Capillary refill takes less than 2 seconds.  ?Neurological:  ?   General: No focal deficit present.  ?   Mental Status: She is alert. Mental status is at baseline.  ? ? ?ED Results / Procedures /  Treatments   ?Labs ?(all labs ordered are listed, but only abnormal results are displayed) ?Labs Reviewed - No data to display ? ?EKG ?None ? ?Radiology ?No results found. ? ?Procedures ?Procedures  ? ? ?Medications Ordered in ED ?Medications  ?ibuprofen (ADVIL) tablet 600 mg (has no administration in time range)  ?alum & mag hydroxide-simeth (MAALOX/MYLANTA) 200-200-20 MG/5ML suspension 30 mL (has no administration in time range)  ?nicotine (NICODERM CQ - dosed in mg/24 hours) patch 21 mg (has no administration in time range)  ? ? ?ED Course/ Medical Decision Making/ A&P ?  ?                        ?Medical Decision Making ?Risk ?OTC drugs. ? ? ?This patient presents to the ED for concern of homeless, this involves an extensive number of treatment options, and is a complaint that carries with it a high risk of complications and morbidity.  The differential diagnosis includes homeless and placement issues. ? ? ?Co morbidities that complicate  the patient evaluation ? ?Schizoaffective d/o ? ? ?Additional history obtained: ? ?Additional history obtained from epic chart review ? ? ? ?Social Determinants of Health: ? ?homeless ? ? ?Dispostion: ? ?After consideration of the diagnostic results and the patients response to treatment, I feel that the patent would benefit from discharge .   ? ? ? ? ? ? ? ?Final Clinical Impression(s) / ED Diagnoses ?Final diagnoses:  ?Homeless  ?Ambulatory dysfunction  ? ? ?Rx / DC Orders ?ED Discharge Orders   ? ? None  ? ?  ? ? ?  ?Jacalyn Lefevre, MD ?07/15/21 1516 ? ?

## 2021-07-15 NOTE — Care Management (Addendum)
APS worker made aware that patient will be boarding in the ED .She  has court in the morning but states she  will pick her up between 1000-1100 am.  ?

## 2021-07-15 NOTE — ED Provider Notes (Signed)
?[facility] EMERGENCY DEPARTMENT ?Provider Note ? ? ?CSN: [id number] ?Arrival date & time: 07/15/21  2130 ? ?  ? ?History ? ?Chief Complaint  ?Patient presents with  ? Fatigue  ? ? ?Angela Medina is a 71 y.o. female here requesting transportation to get to [facility].Marland Kitchen  She was discharged a few hours ago.  Patient well-known to the emergency department as well as myself.  No new falls or injuries. She has no complaints aside from requesting transportation. ? ?HPI ? ?  ? ?Home Medications ?Prior to Admission medications   ?Medication Sig Start Date End Date Taking? Authorizing Provider  ?ibuprofen (ADVIL) 800 MG tablet Take 1 tablet (800 mg total) by mouth every 8 (eight) hours as needed for moderate pain. 07/13/21   Bethann Berkshire, MD  ?mupirocin cream (BACTROBAN) 2 % Apply 1 application. topically 3 (three) times daily. 07/05/21   [provider]  ?sulfamethoxazole-trimethoprim (BACTRIM DS) 800-160 MG tablet Take 1 tablet by mouth 2 (two) times daily. 07/05/21   [provider]  ?   ? ?Allergies    ?Methotrexate derivatives and Penicillins   ? ?Review of Systems   ?Review of Systems  ?All other systems reviewed and are negative. ? ?Physical Exam ?Updated Vital Signs ?BP 129/90 (BP Location: Right Arm)   Pulse 75   Temp 98.1 ?F (36.7 ?C) (Oral)   Resp 16   SpO2 95%  ?Physical Exam ?Vitals and nursing note reviewed.  ?Constitutional:   ?   General: She is not in acute distress. ?   Appearance: She is well-developed. She is not ill-appearing.  ?HENT:  ?   Head: Atraumatic.  ?Eyes:  ?   Pupils: Pupils are equal, round, and reactive to light.  ?Cardiovascular:  ?   Rate and Rhythm: Normal rate.  ?Pulmonary:  ?   Effort: No respiratory distress.  ?Abdominal:  ?   General: There is no distension.  ?Musculoskeletal:     ?   General: Normal range of motion.  ?   Cervical back: Normal range of motion.  ?Skin: ?   General: Skin is warm and dry.  ?Neurological:  ?   General: No focal deficit  present.  ?   Mental Status: She is alert.  ?Psychiatric:     ?   Mood and Affect: Mood normal.  ? ? ?ED Results / Procedures / Treatments   ?Labs ?(all labs ordered are listed, but only abnormal results are displayed) ?Labs Reviewed - No data to display ? ?EKG ?None ? ?Radiology ?DG Chest 2 View ? ?Result Date: 07/13/2021 ?CLINICAL DATA:  71 year old female with a history of pain EXAM: CHEST - 2 VIEW COMPARISON:  Multiple prior most recent 07/06/2021 FINDINGS: Cardiomediastinal silhouette unchanged in size and contour. No interlobular septal thickening. No central vascular congestion. No pneumothorax or pleural effusion. No new confluent airspace disease. Similar appearance of mid/lower thoracic compression fracture, with osteopenia. No displaced rib fracture identified IMPRESSION: Negative for acute cardiopulmonary disease. Redemonstration of mid/lower thoracic compression fracture Electronically Signed   By: Gilmer Mor D.O.   On: 07/13/2021 09:59  ? ?DG Lumbar Spine Complete ? ?Result Date: 07/13/2021 ?CLINICAL DATA:  71 year old female with a history of fall EXAM: LUMBAR SPINE - COMPLETE 4+ VIEW COMPARISON:  None. FINDINGS: Osteopenia. Right apex scoliotic curvature of the lumbar spine. No available comparison. Oblique images demonstrate no displaced pars defect. Vertebral body heights maintained. No acute displaced fracture identified. Disc disease present throughout the lumbar spine with endplate  changes and anterior osteophyte production. Facet hypertrophy worst in the lower lumbar levels. Vascular calcifications IMPRESSION: No radiographic evidence of acute fracture of the lumbar spine. Multilevel degenerative changes and rightward scoliotic curvature Electronically Signed   By: Gilmer MorJaime  Wagner D.O.   On: 07/13/2021 09:55  ? ?CT Head Wo Contrast ? ?Result Date: 07/13/2021 ?CLINICAL DATA:  71 year old female with a history of head trauma EXAM: CT HEAD WITHOUT CONTRAST CT CERVICAL SPINE WITHOUT CONTRAST  TECHNIQUE: Multidetector CT imaging of the head and cervical spine was performed following the standard protocol without intravenous contrast. Multiplanar CT image reconstructions of the cervical spine were also generated. RADIATION DOSE REDUCTION: This exam was performed according to the departmental dose-optimization program which includes automated exposure control, adjustment of the mA and/or kV according to patient size and/or use of iterative reconstruction technique. COMPARISON:  07/06/2021 FINDINGS: CT HEAD FINDINGS Brain: No acute intracranial hemorrhage. No midline shift or mass effect. Gray-white differentiation maintained. Patchy hypodensity in the bilateral periventricular white matter, unchanged from the comparison. Unremarkable appearance of the ventricular system. 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  No aggressive bone lesion identified. Sinuses/Orbits: Unremarkable appearance of the orbits. Mastoid air cells clear. No middle ear effusion. No significant sinus disease. CT CERVICAL SPINE FINDINGS Cervical Spine: Within the limits of the examination secondary to motion artifact, the alignment of the cervical spine is relatively unchanged from the recent comparison of 07/06/2021. Kyphotic deformity centered at the C4 level. Trace anterolisthesis of C2 on C3. 5 mm of anterolisthesis of C3 on C4. 3 mm of anterolisthesis of C4 on C5. 3 mm of anterolisthesis of C7 on T1 Within the limits of examination secondary to motion artifact, the configuration of the vertebral body heights is unchanged when compared to the prior CT, with 50% height loss at C4, C5. Within the limits of exam secondary to motion artifact, there is no displaced fracture identified. Similar appearance of advanced disc disease throughout the cervical spine with disc space narrowing, endplate sclerosis, uncovertebral joint disease.  Associated facet disease throughout the cervical spine, similar to comparison studies. Advanced disease results in varying degrees of bilateral neural foraminal narrowing throughout the cervical spine. Unremarkable appearance of the lung apices. Vascular calcifications. No evidence of canal hematoma. IMPRESSION: Head CT: Head CT negative for acute finding. Similar appearance of chronic white matter disease and associated intracranial atherosclerosis. Cervical CT: Study somewhat limited by significant motion artifact, however, no displaced fracture identified and the alignment and advanced degenerative changes are relatively similar to serial recent cervical CT studies. If there is ongoing concern for possible occult fracture, either a repeat CT or MRI would be needed. Electronically Signed   By: Gilmer MorJaime  Wagner D.O.   On: 07/13/2021 09:49  ? ?CT Cervical Spine Wo Contrast ? ?Result Date: 07/13/2021 ?CLINICAL DATA:  71 year old female with a history of head trauma EXAM: CT HEAD WITHOUT CONTRAST CT CERVICAL SPINE WITHOUT CONTRAST TECHNIQUE: Multidetector CT imaging of the head and cervical spine was performed following the standard protocol without intravenous contrast. Multiplanar CT image reconstructions of the cervical spine were also generated. RADIATION DOSE REDUCTION: This exam was performed according to the departmental dose-optimization program which includes automated exposure control, adjustment of the mA and/or kV according to patient size and/or use of iterative reconstruction technique. COMPARISON:  07/06/2021 FINDINGS: CT HEAD FINDINGS Brain: No acute intracranial hemorrhage. No midline shift or mass effect. Gray-white differentiation maintained. Patchy hypodensity in  the bilateral periventricular white matter, unchanged from the comparison. Unremarkable appearance of the ventricular system. 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  No aggressive bone lesion identified. Sinuses/Orbits: Unremarkable appearance of the orbits. Mastoid air cells clear. No middle ear effusion. No significant sinus disease. CT CERVICAL SPINE FIN

## 2021-07-15 NOTE — ED Notes (Signed)
Assisted pt with meal tray. GCS 15 ?

## 2021-07-15 NOTE — Care Management (Addendum)
Patient remains in the ED waiting room, cab voucher was given but states she  is unable to get in cab. Cannot transport by [facility], as there is noone at the address. Called APS worker Feliz Beam [phone] left HIPPA compliant message for her to call this RNCM back regarding the patient. ?

## 2021-07-16 ENCOUNTER — Emergency Department (HOSPITAL_COMMUNITY)
Admission: EM | Admit: 2021-07-16 | Discharge: 2021-07-16 | Discharge status: Against Medical Advice | Payer: Medicare Other | Source: Home / Self Care

## 2021-07-16 ENCOUNTER — Other Ambulatory Visit: Payer: Self-pay

## 2021-07-16 ENCOUNTER — Encounter (HOSPITAL_COMMUNITY): Payer: Self-pay | Admitting: Emergency Medicine

## 2021-07-16 ENCOUNTER — Emergency Department (HOSPITAL_COMMUNITY)
Admission: EM | Admit: 2021-07-16 | Discharge: 2021-07-16 | Discharge status: Against Medical Advice | Payer: Medicare Other | Attending: Emergency Medicine | Admitting: Emergency Medicine

## 2021-07-16 DIAGNOSIS — Z5321 Procedure and treatment not carried out due to patient leaving prior to being seen by health care provider: Secondary | ICD-10-CM | POA: Insufficient documentation

## 2021-07-16 DIAGNOSIS — R2689 Other abnormalities of gait and mobility: Secondary | ICD-10-CM | POA: Insufficient documentation

## 2021-07-16 NOTE — ED Notes (Signed)
Social worker here with APS, to take and manage ID to get assistance and to provide hotel. Refused instructions and nicotine patch. Escort to exit via security in w/c ?

## 2021-07-16 NOTE — ED Triage Notes (Signed)
Pt BIB [facility] from bus stop, per EMS patient requested APS worker she was riding with to pull over and call EMS as she wanted help getting to the bus stop, pt changed her mind upon EMS arrival and decided she wanted to come to the hospital because she cant walk. Pt cussing and yelling at staff in triage. ?

## 2021-07-16 NOTE — ED Notes (Signed)
Pt wheeled to the top of the hill by security  ?

## 2021-07-16 NOTE — ED Provider Notes (Signed)
?  Physical Exam  ?BP (!) 145/85 (BP Location: Right Arm)   Pulse (!) 54   Temp 97.9 ?F (36.6 ?C) (Oral)   Resp 17   SpO2 96%  ? ?Physical Exam ?Elderly female laying in the bed in no acute distress moving all 4 extremities equally ?Procedures  ?Procedures ? ?ED Course / MDM  ?  ?Medical Decision Making ?Risk ?OTC drugs. ? ? ?Patient well-known to [place] who presents frequently due to homelessness. ?Patient presented last night with request for transportation to [facility].  Patient is ambulatory with a walker. ?Patient has had 35 visits in the past 6 months. ?Labs obtained on March 25 are significant for mild hypokalemia and otherwise within normal limits ?Patient appears to be at her baseline. ?[facility]ult Prote[facility] Services is here to take patient to [facility] and Social Security office for ID and start the process of obtaining benefits.  They will place her in a hotel room. ?Patient appears to be at her baseline ? ? ? ?  ?Margarita Grizzle, MD ?07/16/21 1031 ? ?

## 2021-07-16 NOTE — ED Notes (Signed)
Pt wanted to be pushed outside.  ?

## 2021-07-16 NOTE — ED Provider Triage Note (Signed)
Emergency Medicine Provider Triage Evaluation Note ? ?Angela Medina , a 71 y.o. female  was evaluated in triage.  Pt complains of unable to ambulate.  Patient reports that she was dropped off at a bus stop and has been unable to ambulate since then. ? ?Per EMS patient was brought to the bedside by APS worker.  EMS was called due to patient having difficulty ambulating to bus bench.  Patient requested to come to the emergency department since then. ? ?Review of Systems  ?Positive:  ?Negative:  ? ?Physical Exam  ?BP 114/77   Pulse 73   Temp 97.9 ?F (36.6 ?C)   Resp 13   SpO2 97%  ?Gen:   Awake, no distress   ?Resp:  Normal effort  ?MSK:   Moves extremities without difficulty  ?Other:  Wearing scrubs.  Is disheveled and unkempt. ? ?Medical Decision Making  ?Medically screening exam initiated at 2:34 PM.  Appropriate orders placed.  ZUZU EGLE was informed that the remainder of the evaluation will be completed by another provider, this initial triage assessment does not replace that evaluation, and the importance of remaining in the ED until their evaluation is complete. ? ?Patient well-known to the emergency department.  Was discharged earlier today with plan to have APS set up resources for patient.  Social work is aware that patient is here and is contacting APS. ?  ?Loni Beckwith, PA-C ?07/16/21 1436 ? ?

## 2021-07-16 NOTE — ED Notes (Signed)
Breakfast meal given to patient. °

## 2021-07-16 NOTE — Progress Notes (Signed)
CSW spoke with patients APS worker [name], [phone] to find out why patient did not receive her ID today. CSW was told that patient refused to get out of the [facility] once they got to the [facility]. Patient told the APS worker that she could not walk. Patient asked to be brought back to the bus stop and to go back to the hospital. Feliz Beam stated she could not be reliable if patient fell on her watch. Feliz Beam stated she spoke with her supervisor to find out if they can purchase patient a wheelchair and try to get her an ID another day. [name] asked CSW if patient can go to SNF. CSW has explained to [name] more than once that patient does not qualify for short term rehab due to her being homeless and there being no disposition. CSW explained again that patient needs long term and that's why an APS report was filed for assistance. CSW explained again that patient does not qualify for full medicaid at this time and patients has no ID or access to her money. CSW stated this has been an ongoing problem for weeks. Feliz Beam is supposed to get back with CSW.  ?

## 2021-07-17 ENCOUNTER — Emergency Department (HOSPITAL_COMMUNITY)
Admission: EM | Admit: 2021-07-17 | Discharge: 2021-07-17 | Disposition: A | Discharge status: Home / Self Care | Payer: Medicare Other | Attending: Emergency Medicine | Admitting: Emergency Medicine

## 2021-07-17 ENCOUNTER — Emergency Department (HOSPITAL_COMMUNITY): Payer: Medicare Other

## 2021-07-17 ENCOUNTER — Other Ambulatory Visit: Payer: Self-pay

## 2021-07-17 ENCOUNTER — Encounter (HOSPITAL_COMMUNITY): Payer: Self-pay

## 2021-07-17 DIAGNOSIS — R200 Anesthesia of skin: Secondary | ICD-10-CM | POA: Insufficient documentation

## 2021-07-17 DIAGNOSIS — R296 Repeated falls: Secondary | ICD-10-CM | POA: Insufficient documentation

## 2021-07-17 DIAGNOSIS — R531 Weakness: Secondary | ICD-10-CM | POA: Insufficient documentation

## 2021-07-17 DIAGNOSIS — Z5900 Homelessness unspecified: Secondary | ICD-10-CM | POA: Insufficient documentation

## 2021-07-17 DIAGNOSIS — R202 Paresthesia of skin: Secondary | ICD-10-CM

## 2021-07-17 LAB — CBC WITH DIFFERENTIAL/PLATELET
Abs Immature Granulocytes: 0.05 10*3/uL (ref 0.00–0.07)
Basophils Absolute: 0.1 10*3/uL (ref 0.0–0.1)
Basophils Relative: 1 %
Eosinophils Absolute: 0.1 10*3/uL (ref 0.0–0.5)
Eosinophils Relative: 1 %
HCT: 41.9 % (ref 36.0–46.0)
Hemoglobin: 13.6 g/dL (ref 12.0–15.0)
Immature Granulocytes: 1 %
Lymphocytes Relative: 18 %
Lymphs Abs: 1.7 10*3/uL (ref 0.7–4.0)
MCH: 31.4 pg (ref 26.0–34.0)
MCHC: 32.5 g/dL (ref 30.0–36.0)
MCV: 96.8 fL (ref 80.0–100.0)
Monocytes Absolute: 0.8 10*3/uL (ref 0.1–1.0)
Monocytes Relative: 9 %
Neutro Abs: 6.4 10*3/uL (ref 1.7–7.7)
Neutrophils Relative %: 70 %
Platelets: 421 10*3/uL — ABNORMAL HIGH (ref 150–400)
RBC: 4.33 MIL/uL (ref 3.87–5.11)
RDW: 12.9 % (ref 11.5–15.5)
WBC: 9.1 10*3/uL (ref 4.0–10.5)
nRBC: 0 % (ref 0.0–0.2)

## 2021-07-17 LAB — COMPREHENSIVE METABOLIC PANEL
ALT: 16 U/L (ref 0–44)
AST: 23 U/L (ref 15–41)
Albumin: 3.4 g/dL — ABNORMAL LOW (ref 3.5–5.0)
Alkaline Phosphatase: 88 U/L (ref 38–126)
Anion gap: 7 (ref 5–15)
BUN: 23 mg/dL (ref 8–23)
CO2: 29 mmol/L (ref 22–32)
Calcium: 8.6 mg/dL — ABNORMAL LOW (ref 8.9–10.3)
Chloride: 101 mmol/L (ref 98–111)
Creatinine, Ser: 0.59 mg/dL (ref 0.44–1.00)
GFR, Estimated: >60 mL/min (ref >60)
Glucose, Bld: 100 mg/dL — ABNORMAL HIGH (ref 70–99)
Potassium: 3.5 mmol/L (ref 3.5–5.1)
Sodium: 137 mmol/L (ref 135–145)
Total Bilirubin: 0.1 mg/dL — ABNORMAL LOW (ref 0.3–1.2)
Total Protein: 6.8 g/dL (ref 6.5–8.1)

## 2021-07-17 LAB — ETHANOL: Alcohol, Ethyl (B): <10 mg/dL (ref <10)

## 2021-07-17 LAB — MAGNESIUM: Magnesium: 2.2 mg/dL (ref 1.7–2.4)

## 2021-07-17 MED ORDER — SODIUM CHLORIDE 0.9 % IV BOLUS
1000.0000 mL | Freq: Once | INTRAVENOUS | Status: AC
  Administered 2021-07-17: 1000 mL via INTRAVENOUS

## 2021-07-17 NOTE — ED Provider Notes (Signed)
?[facility]-EMERGENCY DEPT ?Provider Note ? ? ?CSN: [id number] ?Arrival date & time: 07/17/21  1543 ? ?  ? ?History ? ?Chief Complaint  ?Patient presents with  ? Numbness  ? ? ?Angela Medina is a 71 y.o. female homeless here presenting with numbness.  Patient walks with a walker at baseline.  Patient states that she was given a [place] sandwich and she was not sure if she had a reaction to it and she became numb both arms and legs.  Patient also feels weak as well.  Patient walks with a walker at baseline she states that she has trouble walking.  Patient was recently discharged from the ED yesterday.  Patient was seen multiple times for similar symptoms.  Patient was noted to be hypokalemic yesterday.  Denies any falls today.  Patient request a cab voucher ? ?The history is provided by the patient.  ? ?  ? ?Home Medications ?Prior to Admission medications   ?Medication Sig Start Date End Date Taking? Authorizing Provider  ?ibuprofen (ADVIL) 800 MG tablet Take 1 tablet (800 mg total) by mouth every 8 (eight) hours as needed for moderate pain. ?Patient not taking: Reported on 07/16/2021 07/13/21   Bethann BerkshireZammit, Joseph, MD  ?   ? ?Allergies    ?Methotrexate derivatives and Penicillins   ? ?Review of Systems   ?Review of Systems  ?Neurological:  Positive for weakness and numbness.  ?All other systems reviewed and are negative. ? ?Physical Exam ?Updated Vital Signs ?BP (!) 149/88 (BP Location: Left Arm)   Pulse 74   Temp 98 ?F (36.7 ?C)   Resp 18   Ht 5' (1.524 m)   Wt 61.2 kg   SpO2 98%   BMI 26.37 kg/m?  ?Physical Exam ?Vitals and nursing note reviewed.  ?Constitutional:   ?   Comments: Disheveled  ?HENT:  ?   Head: Normocephalic.  ?   Nose: Nose normal.  ?   Mouth/Throat:  ?   Mouth: Mucous membranes are dry.  ?Eyes:  ?   Extraocular Movements: Extraocular movements intact.  ?   Pupils: Pupils are equal, round, and reactive to light.  ?Cardiovascular:  ?   Rate and Rhythm: Normal rate and regular  rhythm.  ?   Pulses: Normal pulses.  ?   Heart sounds: Normal heart sounds.  ?Pulmonary:  ?   Effort: Pulmonary effort is normal.  ?   Breath sounds: Normal breath sounds.  ?Abdominal:  ?   General: Abdomen is flat.  ?   Palpations: Abdomen is soft.  ?Musculoskeletal:     ?   General: Normal range of motion.  ?   Cervical back: Normal range of motion and neck supple.  ?Skin: ?   General: Skin is warm.  ?   Capillary Refill: Capillary refill takes less than 2 seconds.  ?Neurological:  ?   Comments: Cranial nerves II to XII intact.  Patient does have decreased sensation bilateral arms and legs.  There is no focal weakness however.  ?Psychiatric:     ?   Mood and Affect: Mood normal.  ? ? ?ED Results / Procedures / Treatments   ?Labs ?(all labs ordered are listed, but only abnormal results are displayed) ?Labs Reviewed  ?CBC WITH DIFFERENTIAL/PLATELET  ?COMPREHENSIVE METABOLIC PANEL  ?MAGNESIUM  ?ETHANOL  ? ? ?EKG ?None ? ?Radiology ?No results found. ? ?Procedures ?Procedures  ? ? ?Medications Ordered in ED ?Medications  ?sodium chloride 0.9 % bolus 1,000 mL (has no administration  in time range)  ? ? ?ED Course/ Medical Decision Making/ A&P ?  ?                        ?Medical Decision Making ?[name] is a 71 y.o. female homeless here presenting with numbness and weakness.  Patient has been here multiple times for similar symptoms.  Was recently noted to be hypokalemic.  Consider hypokalemia versus electrolyte abnormalities versus renal failure since she is homeless and does not eat that much at baseline.  She has bilateral arms and leg numbness and weakness and have low suspicion for stroke.  We will get CT head given that she has recurrent falls but patient does not require MRI currently.  We will also consult social worker regarding homelessness ? ?6:00 PM ?Labs and CT head unremarkable.  Patient is able to ambulate with walker.  Social work was able to give her a Electronics engineer.  At this point, she is  stable for discharge.  She states that she has somewhere to go tonight.  ? ? ?Problems Addressed: ?Paresthesia: acute illness or injury ? ?Amount and/or Complexity of Data Reviewed ?Labs: ordered. Decision-making details documented in ED Course. ?Radiology: ordered and independent interpretation performed. Decision-making details documented in ED Course. ? ?Final Clinical Impression(s) / ED Diagnoses ?Final diagnoses:  ?None  ? ? ?Rx / DC Orders ?ED Discharge Orders   ? ? None  ? ?  ? ? ?  ?Charlynne Pander, MD ?07/17/21 1800 ? ?

## 2021-07-17 NOTE — Discharge Instructions (Signed)
Your CT head and labs were unremarkable today. ? ?Stay hydrated. ? ?See your doctor for follow up ? ?Please see shelter resources.  ? ?Return to ER if you have worse numbness, weakness, unable to walk ?

## 2021-07-17 NOTE — ED Notes (Signed)
Patient transported to CT 

## 2021-07-17 NOTE — ED Notes (Signed)
Pt was able to take a couple of steps with a walker. Pt need assistant while ambulating. ?

## 2021-07-17 NOTE — ED Triage Notes (Addendum)
Per EMS, Pt, from [facility], c/o numbness in BUEs.  Denies pain.  Denies change in sensation.  Sts grip strength has been weakening for several weeks.  Sts numbness started after taking a bite of a sandwich.   ? ?Denies drug and ETOH use. ? ?Pt is homeless and noted to be in burgundy scrubs.  Pt was seen at [facility] yesterday.   ?

## 2021-07-17 NOTE — ED Notes (Signed)
Pt requested to be taken to [address], [place].  Sts she has a meeting there tomorrow.  ?

## 2021-07-17 NOTE — Progress Notes (Signed)
TOC CSW received a consult for transportation needs. Pt is homeless with frequent visit to the ER. Pt Is being followed by APS.  Pt can d/c with cab voucher. CSW contacted pt's APS worker Feliz BeamLaquita [phone] to inform her about pt's discharge.  ? ?Valentina ShaggyChristina M.Lakendria Nicastro, MSW, LCSWA ?[facility]  Transitions [facility] Care ?Clinical Social Worker I ?Direct Dial: [phone]  Fax: [phone] ?[email or web address]  ?

## 2021-07-17 NOTE — ED Notes (Signed)
Pt found to have pants around mid-thigh, IV pulled out, and screaming at the top of her lungs.  IV fluid was 90% completed and IV site covered w/ Tegaderm.  ?

## 2021-07-18 ENCOUNTER — Emergency Department (HOSPITAL_COMMUNITY): Payer: Medicare Other

## 2021-07-18 ENCOUNTER — Emergency Department (EMERGENCY_DEPARTMENT_HOSPITAL)
Admission: EM | Admit: 2021-07-18 | Discharge: 2021-07-19 | Disposition: A | Discharge status: Home / Self Care | Payer: Medicare Other | Source: Home / Self Care | Attending: Emergency Medicine | Admitting: Emergency Medicine

## 2021-07-18 DIAGNOSIS — R52 Pain, unspecified: Secondary | ICD-10-CM

## 2021-07-18 DIAGNOSIS — W1839XA Other fall on same level, initial encounter: Secondary | ICD-10-CM | POA: Insufficient documentation

## 2021-07-18 DIAGNOSIS — X31XXXA Exposure to excessive natural cold, initial encounter: Secondary | ICD-10-CM | POA: Insufficient documentation

## 2021-07-18 DIAGNOSIS — S61401A Unspecified open wound of right hand, initial encounter: Secondary | ICD-10-CM | POA: Insufficient documentation

## 2021-07-18 DIAGNOSIS — M25552 Pain in left hip: Secondary | ICD-10-CM | POA: Insufficient documentation

## 2021-07-18 DIAGNOSIS — M4712 Other spondylosis with myelopathy, cervical region: Secondary | ICD-10-CM | POA: Diagnosis not present

## 2021-07-18 DIAGNOSIS — T68XXXA Hypothermia, initial encounter: Secondary | ICD-10-CM | POA: Insufficient documentation

## 2021-07-18 DIAGNOSIS — Z20822 Contact with and (suspected) exposure to covid-19: Secondary | ICD-10-CM | POA: Insufficient documentation

## 2021-07-18 DIAGNOSIS — G9520 Unspecified cord compression: Secondary | ICD-10-CM | POA: Diagnosis not present

## 2021-07-18 DIAGNOSIS — F259 Schizoaffective disorder, unspecified: Secondary | ICD-10-CM | POA: Diagnosis not present

## 2021-07-18 LAB — BASIC METABOLIC PANEL
Anion gap: 9 (ref 5–15)
BUN: 15 mg/dL (ref 8–23)
CO2: 29 mmol/L (ref 22–32)
Calcium: 8.9 mg/dL (ref 8.9–10.3)
Chloride: 100 mmol/L (ref 98–111)
Creatinine, Ser: 0.45 mg/dL (ref 0.44–1.00)
GFR, Estimated: >60 mL/min (ref >60)
Glucose, Bld: 92 mg/dL (ref 70–99)
Potassium: 3.4 mmol/L — ABNORMAL LOW (ref 3.5–5.1)
Sodium: 138 mmol/L (ref 135–145)

## 2021-07-18 LAB — CBC WITH DIFFERENTIAL/PLATELET
Abs Immature Granulocytes: 0.05 10*3/uL (ref 0.00–0.07)
Basophils Absolute: 0.1 10*3/uL (ref 0.0–0.1)
Basophils Relative: 1 %
Eosinophils Absolute: 0.1 10*3/uL (ref 0.0–0.5)
Eosinophils Relative: 1 %
HCT: 46.4 % — ABNORMAL HIGH (ref 36.0–46.0)
Hemoglobin: 15.3 g/dL — ABNORMAL HIGH (ref 12.0–15.0)
Immature Granulocytes: 1 %
Lymphocytes Relative: 14 %
Lymphs Abs: 1.4 10*3/uL (ref 0.7–4.0)
MCH: 31.7 pg (ref 26.0–34.0)
MCHC: 33 g/dL (ref 30.0–36.0)
MCV: 96.1 fL (ref 80.0–100.0)
Monocytes Absolute: 0.7 10*3/uL (ref 0.1–1.0)
Monocytes Relative: 7 %
Neutro Abs: 7.6 10*3/uL (ref 1.7–7.7)
Neutrophils Relative %: 76 %
Platelets: 442 10*3/uL — ABNORMAL HIGH (ref 150–400)
RBC: 4.83 MIL/uL (ref 3.87–5.11)
RDW: 12.9 % (ref 11.5–15.5)
WBC: 9.9 10*3/uL (ref 4.0–10.5)
nRBC: 0 % (ref 0.0–0.2)

## 2021-07-18 LAB — RESP PANEL BY RT-PCR (FLU A&B, COVID) ARPGX2
Influenza A by PCR: NEGATIVE
Influenza B by PCR: NEGATIVE
SARS Coronavirus 2 by RT PCR: NEGATIVE

## 2021-07-18 MED ORDER — ACETAMINOPHEN 500 MG PO TABS: 1000.0000 mg | ORAL_TABLET | Freq: Once | ORAL | Status: DC

## 2021-07-18 MED ORDER — ACETAMINOPHEN 325 MG PO TABS: 650.0000 mg | ORAL_TABLET | ORAL | Status: DC | PRN

## 2021-07-18 MED ORDER — IBUPROFEN 200 MG PO TABS
400.0000 mg | ORAL_TABLET | Freq: Once | ORAL | Status: DC
  Filled 2021-07-18: qty 2

## 2021-07-18 MED ORDER — SODIUM CHLORIDE 0.9 % IV BOLUS
1000.0000 mL | Freq: Once | INTRAVENOUS | Status: AC
  Administered 2021-07-18: 1000 mL via INTRAVENOUS

## 2021-07-18 NOTE — ED Notes (Signed)
Wheelchair delivered to room from [employer] ?

## 2021-07-18 NOTE — Progress Notes (Signed)
RNCM spoke with patient's APS worker Feliz Beam [phone] to advise patient is still awaiting psych evaluation before being discharged. This RNCM advised [name] that patient has received wheelchair however APS will be responsible for keeping up with the wheelchair, due to patient will not be able to get another one. [name] verblized understanding and indicated she will pick patient up tomorrow 07/18/2021 at 8am.   ?

## 2021-07-18 NOTE — ED Provider Notes (Signed)
?[facility]-EMERGENCY DEPT ?Provider Note ? ? ?CSN: [id number] ?Arrival date & time: 07/18/21  4098 ? ?  ? ?History ? ?Chief Complaint  ?Patient presents with  ? Fall  ? Cold Exposure  ? ? ?Angela Medina is a 71 y.o. female. ? ?HPI ?Adult female presents after possible fall.  History is obtained by EMS, nursing, and on chart review.  Patient notes that after being seen and evaluated yesterday, she left, may have had a fall, complains of right hip pain, though she notes that she always has pain in this area.  Nursing notes from triage suggest this may be left hip pain, patient is a poor historian in this regard. ?Notably the patient was found to be hypothermic on arrival, 92 degrees, Fahrenheit.  She denies fever, dyspnea, acknowledges fatigue, and difficulty with functioning outside of the hospital environment.  On chart review it seems as though social work has been involved extensively with this patient, and is recently 2 days ago was attempting to get the patient identification cards to enable her to obtain housing.  Patient cannot comment on this. ?Currently she complains of pain in her hips. ?  ? ?Home Medications ?Prior to Admission medications   ?Medication Sig Start Date End Date Taking? Authorizing Provider  ?ibuprofen (ADVIL) 800 MG tablet Take 1 tablet (800 mg total) by mouth every 8 (eight) hours as needed for moderate pain. ?Patient not taking: Reported on 07/16/2021 07/13/21   Bethann Berkshire, MD  ?   ? ?Allergies    ?Methotrexate derivatives and Penicillins   ? ?Review of Systems   ?Review of Systems  ?Constitutional:   ?     Per HPI, otherwise negative  ?HENT:    ?     Per HPI, otherwise negative  ?Respiratory:    ?     Per HPI, otherwise negative  ?Cardiovascular:   ?     Per HPI, otherwise negative  ?Gastrointestinal:  Negative for vomiting.  ?Endocrine:  ?     Negative aside from HPI  ?Genitourinary:   ?     Neg aside from HPI   ?Musculoskeletal:   ?     Per HPI, otherwise  negative  ?Skin: Negative.   ?Neurological:  Negative for syncope.  ? ?Physical Exam ?Updated Vital Signs ?BP (!) 80/60   Pulse 78   Temp 98 ?F (36.7 ?C) (Oral)   Resp 17   SpO2 94%  ?Physical Exam ?Vitals and nursing note reviewed.  ?Constitutional:   ?   General: She is not in acute distress. ?   Appearance: She is well-developed. She is ill-appearing. She is not toxic-appearing.  ?HENT:  ?   Head: Normocephalic and atraumatic.  ?Eyes:  ?   Conjunctiva/sclera: Conjunctivae normal.  ?Cardiovascular:  ?   Rate and Rhythm: Normal rate and regular rhythm.  ?Pulmonary:  ?   Effort: Pulmonary effort is normal. No respiratory distress.  ?   Breath sounds: Normal breath sounds. No stridor.  ?Abdominal:  ?   General: There is no distension.  ?Musculoskeletal:  ?   Comments: Pelvis stable, patient moves both legs spontaneously, to command  ?Skin: ?   General: Skin is warm and dry.  ?   Comments: Hemostatic wound, small, dorsum of right hand.  No wounds identified on the face, ear  ?Neurological:  ?   Mental Status: She is alert and oriented to person, place, and time.  ?   Cranial Nerves: No cranial nerve deficit.  ?  Motor: Atrophy present. No tremor or abnormal muscle tone.  ?Psychiatric:  ?   Comments: Patient has no insight into her condition, repeat ED evaluations, but is pleasant.  ? ? ?ED Results / Procedures / Treatments   ?Labs ?(all labs ordered are listed, but only abnormal results are displayed) ?Labs Reviewed  ?BASIC METABOLIC PANEL - Abnormal; Notable for the following components:  ?    Result Value  ? Potassium 3.4 (*)   ? All other components within normal limits  ?CBC WITH DIFFERENTIAL/PLATELET - Abnormal; Notable for the following components:  ? Hemoglobin 15.3 (*)   ? HCT 46.4 (*)   ? Platelets 442 (*)   ? All other components within normal limits  ? ? ?EKG ?None ? ?Radiology ?DG Chest 1 View ? ?Result Date: 07/18/2021 ?CLINICAL DATA:  Larey Seat with right hip pain EXAM: CHEST  1 VIEW COMPARISON:   07/13/2021 FINDINGS: Chronic elevation of the left hemidiaphragm. Left ventricular prominence. Atherosclerosis and tortuosity of the aorta. The lungs are clear. No effusion. Chronic degenerative changes affect the spine. IMPRESSION: Active disease.  Chronic findings as above. Electronically Signed   By: Paulina Fusi M.D.   On: 07/18/2021 10:03  ? ?CT HEAD WO CONTRAST ( ) ? ?Result Date: 07/17/2021 ?CLINICAL DATA:  Dizziness EXAM: CT HEAD WITHOUT CONTRAST TECHNIQUE: Contiguous axial images were obtained from the base of the skull through the vertex without intravenous contrast. RADIATION DOSE REDUCTION: This exam was performed according to the departmental dose-optimization program which includes automated exposure control, adjustment of the mA and/or kV according to patient size and/or use of iterative reconstruction technique. COMPARISON:  Head CT dated July 13, 2021 FINDINGS: Brain: Chronic white matter ischemic change. No evidence of acute infarction, hemorrhage, hydrocephalus, extra-axial collection or mass lesion/mass effect. Right anterior circulation aneurysm coil. Vascular: No hyperdense vessel or unexpected calcification. Skull: Normal. Negative for fracture or focal lesion. Sinuses/Orbits: No acute finding. Other: None. IMPRESSION: No acute intracranial abnormality Electronically Signed   By: Allegra Lai M.D.   On: 07/17/2021 16:55  ? ?DG Hip Unilat With Pelvis 2-3 Views Right ? ?Result Date: 07/18/2021 ?CLINICAL DATA:  Fall with right hip pain EXAM: DG HIP (WITH OR WITHOUT PELVIS) 2-3V RIGHT COMPARISON:  None. FINDINGS: There is no evidence of hip fracture or dislocation. There is no evidence of arthropathy or other focal bone abnormality. IMPRESSION: Negative. Electronically Signed   By: Paulina Fusi M.D.   On: 07/18/2021 10:02   ? ?Procedures ?Procedures  ? ? ?Medications Ordered in ED ?Medications  ?acetaminophen (TYLENOL) tablet 1,000 mg (has no administration in time range)  ?sodium chloride  0.9 % bolus 1,000 mL (0 mLs Intravenous Stopped 07/18/21 1015)  ?sodium chloride 0.9 % bolus 1,000 mL (0 mLs Intravenous Stopped 07/18/21 1313)  ? ? ?ED Course/ Medical Decision Making/ A& ?This patient with a Hx of schizoaffective disorder, frequent ED visits presents to the ED for concern of possible fall with hypothermia, this involves an extensive number of treatment options, and is a complaint that carries with it a high risk of complications and morbidity.   ? ?The differential diagnosis includes uremia, sepsis, exposure to cold environment, less likely fracture, intracranial injury ? ? ?Social Determinants of Health: ? ?Homelessness, schizoaffective disorder ? ?Additional history obtained: ? ?Additional history and/or information obtained from chart review, notable for multiple ED visits 37 in the past 6 months, social work has extensive involvement, notes including documentation of the patient's difficulty with completing evaluation, following up ? ? ?  After the initial evaluation, orders, including: X-ray were initiated.  CT ordered from triage ? ? ?Patient placed on Cardiac and Pulse-Oximetry Monitors. ?The patient was maintained on a cardiac monitor.  The cardiac monitored showed an rhythm of 70 sinus normal ?The patient was also maintained on pulse oximetry. The readings were typically 100% room air normal ? ? ?On repeat evaluation of the patient improved ? ?Lab Tests: ? ?I personally interpreted labs.  The pertinent results include: Unremarkable labs ? ?Imaging Studies ordered: ? ?I independently visualized and interpreted imaging which showed no intracranial injury, no pelvic fracture ?I agree with the radiologist interpretation ? ?Consultations Obtained: ? ?I requested consultation with the social work,  and discussed lab and imaging findings as well as pertinent plan - they recommend: DME order for wheelchair.  Patient has had this placed.  We discussed the patient's recent episode of trying to obtain  ID, housing with APS, social work notes that provisional wheelchair should facilitate additional attempts, APS has been consulted ? ?Dispostion / Final MDM: ? ?After consideration of the diagnostic results and the patie

## 2021-07-18 NOTE — Progress Notes (Signed)
.  Transition of Care [facility]) - Emergency Department Mini Assessment ? ? ?Patient Details  ?Name: Angela Medina ?MRN: [id number] ?Date of Birth: 06-09-1950 ? ?Transition of Care (TOC) CM/SW Contact:    ?Valentina Shaggy Kailany Dinunzio, LCSW ?Phone Number: ?07/18/2021, 10:49 AM ? ? ?Clinical Narrative: ?CSW received a consult to assist with D/C planning. CSW spoke with pt's APS worker Feliz Beam [phone], she stated she is working on getting pt her ID so she can get access to her bank account. Pt's APS worker stated she attempted to take pt to [facility], however, there were some concerns with pt ambulating. APS workers reported they were unable to receive an ID. APS worker is requesting pt receive a Wheel Chair to help with her mobility and reduce fall risk.  ? ?CSW sent a referral to [facility] for a wheelchair. Jasmine from [employer] called to get pt's credit card information. CSW explained pt's situation, Leavy Cella stated she will have to speak with her supervisor about the order and call this writer back. TOC to follow.   ? ? ? ?ED Mini Assessment: ?What brought you to the Emergency Department? : hip pain ? ?Barriers to Discharge: Continued Medical Work up, ED DME delivery ? ?  ? ?  ? ?Interventions which prevented an admission or readmission: DME Provided, Transportation Screening, Homeless Screening ? ? ? ?Patient Contact and Communications ?  ?  ?  ? ,     ?  ?  ? ?  ?  ?  ? ?Admission diagnosis:  fall ?Patient Active Problem List  ? Diagnosis Date Noted  ? Right foot infection 05/04/2021  ? Homelessness unspecified 04/11/2021  ? Community acquired pneumonia 04/03/2021  ? Schizoaffective disorder (HCC) 04/02/2021  ? 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 Acute hypoxemic respiratory failure (HCC) 04/02/2021  ? Bronchopneumonia 04/01/2021  ? ?PCP:  Patient, No Pcp Per (Inactive) ?Pharmacy:   ?[id number] - [place], [place] - [address] ?[address] ?[place]  [address] ?Phone: [phone] Fax: [phone] ? ?[facility] ?[address] ?[place] [address] ?Phone: [phone] Fax: [phone] ? ?[facility]  ?[address] ?[place] [address] ?Phone: [phone] Fax: [phone] ?  ?

## 2021-07-18 NOTE — ED Notes (Signed)
Warm > D/C @1600  w APS ?  ?Gerhard Munch, MD ?07/18/21 1136 ? ?

## 2021-07-18 NOTE — Discharge Instructions (Addendum)
Please be sure to follow the instructions provided to you previously by our social work Acupuncturist.  Return here for concerning changes in your condition. ? ?For your behavioral health needs you are advised to follow up with an outpatient psychiatrist.  Contact one of the providers listed below at your earliest opportunity to schedule an appointment: ? ?     [facility] ?     [address] ?     [place], [place] [address] ?     [phone] ?     New patients are seen at their walk-in clinic.  Walk-in hours are Monday - Friday from 8:30 am - 12:00 pm, and from 1:00 pm - 2:30 pm.  Walk-in patients are seen on a first come, first served basis, so try to arrive as early as possible for the best chance of being seen the same day. ? ?For your mental health needs, you are advised to continue treatment with [facility]: ? ?     [facility] ?     [address] ?     [place], [place] [address] ?     [phone] ? ?     [facility] ?     [address] ?     [place], [place] [address] ?     [phone]  ?

## 2021-07-18 NOTE — ED Notes (Signed)
Pt yelling out for 'NURSE.' This nurse responded, pt requesting something for pain. Offered the Tylenol that is ordered, pt refused. Stated that staff were 'sadists' and refused Tylenol. Requested ibuprofen, will update [facility] Provider.  ?

## 2021-07-18 NOTE — ED Triage Notes (Signed)
[facility] last night on left side. Denies LOC, head trauma, neck pain. Pain primarily in left hip. Blood noted to ear and hand. Alert to baseline ? ?112/76  ?HR 62 ?97% RA ?CBG 129 ?Temp 97.7 ?

## 2021-07-19 ENCOUNTER — Encounter (HOSPITAL_COMMUNITY): Payer: Self-pay | Admitting: Behavioral Health

## 2021-07-19 ENCOUNTER — Telehealth: Payer: Self-pay

## 2021-07-19 DIAGNOSIS — F259 Schizoaffective disorder, unspecified: Secondary | ICD-10-CM

## 2021-07-19 MED ORDER — OLANZAPINE 5 MG PO TABS
5.0000 mg | ORAL_TABLET | Freq: Every day | ORAL | Status: DC
Start: 2021-07-19 — End: 2021-07-19

## 2021-07-19 NOTE — Consult Note (Signed)
[facility] Face-to-Face Psychiatry Consult  ? ?Reason for Consult:  Schizoaffective disorder ?Referring Physician:  Derwood Kaplan ?Patient Identification: [name] ?MRN:  [id number] ?Principal Diagnosis: Schizoaffective disorder (HCC), Depression ?Diagnosis:  Homelessness ? ?Total Time spent with patient: 20 minutes ? ?Subjective:  "I live on the streets and is so cold out there without blankets." ?  ?HPI:  Angela Medina is a 71 years old Caucasian female who presented to [facility] as a possible fall with complaint of right hip pain. Has history of Schizoaffective behavior, Depression and homelessness. She is a well known patient at the ED. She has been seen at the [facility] about 36 times in the last 6 months. ? ?On assessment today, patient is seen face-to-face eating breakfast on her bed. Chart is reviewed, and findings shared with the treatment team and discussed Dr. Lucianne Muss. On encounter, patient is alert and oriented to person, place, time and situation. Speech is coherent with normal pattern and volume. Able to maintain good eye contact during the assessment. Asked the Provider to help pour her orange juice in a cup and put a straw in it for her to drink. Thought process is coherent and thought content is logical and within normal limit. Patient denied SI/HI/AVH, paranoia, or delusions. ? ?Per chart review, it seems as though social work has been involved extensively with this patient, and is recently 2 days ago, was attempting to get the patient identification cards to enable her to obtain housing.  Adult Protective Services is also involved in patient care. She is psychiatrically cleared and  does not require Psych hospitalization at this time. She was encouraged to restart on her Zyprexa 5 mg po daily.  Her Discharge instructions include referral information to area Psychiatric providers, [facility] consult ordered to address psychosocial needs, ED Physician Dr. Derwood Kaplan, Christina Christovati, LCSW, and [place],  RN made aware. ? ?Past Psychiatric History: Schizoaffective disorder (HCC), Depression, Homelessness ? ?Risk to Self:  no ?Risk to Others:  no ?Prior Inpatient Therapy:  yes ?Prior Outpatient Therapy:  Seen by a psychotherapist in 1994 to 1997 ? ?Past Medical History:  ?Past Medical History:  ?Diagnosis Date  ?? Aneurysm (HCC)   ?? Depression   ?? Schizoaffective disorder (HCC)   ?? Vision changes   ? right eye  ?  ?Past Surgical History:  ?Procedure Laterality Date  ?? FOOT SURGERY    ? ?Family History: History reviewed. No pertinent family history. ?Family Psychiatric  History: Not indicated ?Social History:  ?Social History  ? ?Substance and Sexual Activity  ?Alcohol Use Yes  ? Comment: drinks a bottle of wine a day,sometimes too  ?   ?Social History  ? ?Substance and Sexual Activity  ?Drug Use Never  ?  ?Social History  ? ?Socioeconomic History  ?? Marital status: Single  ?  Spouse name: Not on file  ?? Number of children: Not on file  ?? Years of education: Not on file  ?? Highest education level: Not on file  ?Occupational History  ?? Not on file  ?Tobacco Use  ?? Smoking status: Some Days  ?  Types: Cigarettes  ?? Smokeless tobacco: Never  ?Vaping Use  ?? Vaping Use: Never used  ?Substance and Sexual Activity  ?? Alcohol use: Yes  ?  Comment: drinks a bottle of wine a day,sometimes too  ?? Drug use: Never  ?? Sexual activity: Not on file  ?Other Topics Concern  ?? Not on file  ?Social History Narrative  ?? Not on  file  ? ?Social Determinants of Health  ? ?Financial Resource Strain: Not on file  ?Food Insecurity: Not on file  ?Transportation Needs: Not on file  ?Physical Activity: Not on file  ?Stress: Not on file  ?Social Connections: Not on file  ? ?Additional Social History: ?  ? ?Allergies:   ?Allergies  ?Allergen Reactions  ?? Methotrexate Derivatives Other (See Comments)  ?  Hair  Fall  out  ?? Penicillins Other (See Comments)  ?  Unknown- childhood allergy  ? ? ?Labs:  ?Results for orders placed or  performed during the hospital encounter of 07/18/21 (from the past 48 hour(s))  ?Basic metabolic panel     Status: Abnormal  ? Collection Time: 07/18/21  9:21 AM  ?Result Value Ref Range  ? Sodium 138 135 - 145 mmol/L  ? Potassium 3.4 (L) 3.5 - 5.1 mmol/L  ? Chloride 100 98 - 111 mmol/L  ? CO2 29 22 - 32 mmol/L  ? Glucose, Bld 92 70 - 99 mg/dL  ?  Comment: Glucose reference range applies only to samples taken after fasting for at least 8 hours.  ? BUN 15 8 - 23 mg/dL  ? Creatinine, Ser 0.45 0.44 - 1.00 mg/dL  ? Calcium 8.9 8.9 - 10.3 mg/dL  ? GFR, Estimated >60 >60 mL/min  ?  Comment: (NOTE) ?Calculated using the CKD-EPI Creatinine Equation (2021) ?  ? Anion gap 9 5 - 15  ?  Comment: Performed at [facility], [address], [place], [place] [address]  ?CBC with Differential     Status: Abnormal  ? Collection Time: 07/18/21  9:21 AM  ?Result Value Ref Range  ? WBC 9.9 4.0 - 10.5 K/uL  ? RBC 4.83 3.87 - 5.11 MIL/uL  ? Hemoglobin 15.3 (H) 12.0 - 15.0 g/dL  ? HCT 46.4 (H) 36.0 - 46.0 %  ? MCV 96.1 80.0 - 100.0 fL  ? MCH 31.7 26.0 - 34.0 pg  ? MCHC 33.0 30.0 - 36.0 g/dL  ? RDW 12.9 11.5 - 15.5 %  ? Platelets 442 (H) 150 - 400 K/uL  ? nRBC 0.0 0.0 - 0.2 %  ? Neutrophils Relative % 76 %  ? Neutro Abs 7.6 1.7 - 7.7 K/uL  ? Lymphocytes Relative 14 %  ? Lymphs Abs 1.4 0.7 - 4.0 K/uL  ? Monocytes Relative 7 %  ? Monocytes Absolute 0.7 0.1 - 1.0 K/uL  ? Eosinophils Relative 1 %  ? Eosinophils Absolute 0.1 0.0 - 0.5 K/uL  ? Basophils Relative 1 %  ? Basophils Absolute 0.1 0.0 - 0.1 K/uL  ? Immature Granulocytes 1 %  ? Abs Immature Granulocytes 0.05 0.00 - 0.07 K/uL  ?  Comment: Performed at [facility], [address], [place], [place] [address]  ?Resp Panel by RT-PCR (Flu A&B, Covid) Nasopharyngeal Swab     Status: None  ? Collection Time: 07/18/21  3:17 PM  ? Specimen: Nasopharyngeal Swab; Nasopharyngeal(NP) swabs in vial transport medium  ?Result Value Ref Range  ? SARS Coronavirus 2  by RT PCR NEGATIVE NEGATIVE  ?  Comment: (NOTE) ?SARS-CoV-2 target nucleic acids are NOT DETECTED. ? ?The SARS-CoV-2 RNA is generally detectable in upper respiratory ?specimens during the acute phase of infection. The lowest ?concentration of SARS-CoV-2 viral copies this assay can detect is ?138 copies/mL. A negative result does not preclude SARS-Cov-2 ?infection and should not be used as the sole basis for treatment or ?other patient management decisions. A negative result may  occur with  ?improper specimen collection/handling, submission of specimen other ?than nasopharyngeal swab, presence of viral mutation(s) within the ?areas targeted by this assay, and inadequate number of viral ?copies(<138 copies/mL). A negative result must be combined with ?clinical observations, patient history, and epidemiological ?information. The expected result is Negative. ? ?Fact Sheet for Patients:  ?BloggerCourse.com ? ?Fact Sheet for Healthcare Providers:  ?SeriousBroker.it ? ?This test is no t yet approved or cleared by the [place] FDA and  ?has been authorized for detection and/or diagnosis of SARS-CoV-2 by ?FDA under an Emergency Use Authorization (EUA). This EUA will remain  ?in effect (meaning this test can be used) for the duration of the ?COVID-19 declaration under Section 564(b)(1) of the Act, 21 ?U.S.C.section 360bbb-3(b)(1), unless the authorization is terminated  ?or revoked sooner.  ? ? ?  ? Influenza A by PCR NEGATIVE NEGATIVE  ? Influenza B by PCR NEGATIVE NEGATIVE  ?  Comment: (NOTE) ?The Xpert Xpress SARS-CoV-2/FLU/RSV plus assay is intended as an aid ?in the diagnosis of influenza from Nasopharyngeal swab specimens and ?should not be used as a sole basis for treatment. Nasal washings and ?aspirates are unacceptable for Xpert Xpress SARS-CoV-2/FLU/RSV ?testing. ? ?Fact Sheet for Patients: ?BloggerCourse.com ? ?Fact Sheet for Healthcare  Providers: ?SeriousBroker.it ? ?This test is not yet approved or cleared by the [place] FDA and ?has been authorized for detection and/or diagnosis of SARS-CoV-2 by ?FDA under

## 2021-07-19 NOTE — Telephone Encounter (Signed)
RNCM spoke with APS worker [name] to who advised she was here to pick patient up. Per this RNCM chart review patient has already been discharged. Per bedside RN patient has been d/c to the lobby. RNCM notified APS worker Feliz BeamLaquita that patient is in lobby. ? ? ?[name] called this RNCM again to request assistance with getting patient in her vehicle. RN notified to send assistance to transport patient. RNCM advised patient had been walking with walker without difficulty. APS worker [name] advised the patient acts like that with her.  ? ?No additional TOC needs.   ?

## 2021-07-19 NOTE — BH Assessment (Signed)
[facility] Assessment Progress Note ?  ?Per Hillery Jacks, NP, this voluntary pt does not require psychiatric hospitalization at this time.  Pt is psychiatrically cleared.  Discharge instructions include referral information for area psychiatry providers.  A TOC consult has been ordered to address pt's psychosocial needs.  EDP Derwood Kaplan, MD, Donato Schultz, LCSW and pt's nurse, Galaxi, have been notified. ? ?Doylene Canning, MA ?Triage Specialist ?[phone]  ?

## 2021-07-19 NOTE — Progress Notes (Signed)
RNCM spoke with APS worker [name] [phone], who advised she came to pick the patient up today at 8am however the patient didn't seem to be ready for discharge. [name] will come to pick the patient up today, between 2p-3p and will call when she is in route. EDP and RN notified. ?

## 2021-07-19 NOTE — BH Assessment (Signed)
Per Dr. Kumar at the 9 am bed meeting, no TTS CCA needed at this time.  ?

## 2021-07-19 NOTE — Progress Notes (Signed)
CSW attempted to contact pt's APS worker [name] [phone], no response left HIPPA complaint VM requesting return call. If no return call from APS worker pt can receive cab voucher for d/c . TOC to follow. ? ?Valentina ShaggyChristina M.Neilani Duffee, MSW, LCSWA ?[facility]  Transitions of Care ?Clinical Social Worker I ?Direct Dial: [phone]  Fax: [phone] ?[email or web address]  ?

## 2021-07-19 NOTE — ED Provider Notes (Signed)
Emergency Medicine Observation Re-evaluation Note ? ?Angela Medina is a 71 y.o. female, seen on rounds today.  Pt initially presented to the ED for complaints of Fall and Cold Exposure ?Currently, the patient is resting comfortably. ? ?Physical Exam  ?BP 121/75   Pulse 67   Temp 98.5 ?F (36.9 ?C) (Oral)   Resp 16   SpO2 94%  ?Physical Exam ?General: no acute distress ? ?ED Course / MDM  ?EKG:  ? ?I have reviewed the labs performed to date as well as medications administered while in observation.  Recent changes in the last 24 hours include : none. ? ?Plan  ?Current plan is for patient to be discharged to her ACT team. SW to follow up. ?Couple of isolated low BP that responded on their own. ? Angela Medina is not under involuntary commitment. ? ? ?  ?Derwood Kaplan, MD ?07/19/21 1307 ? ?

## 2021-07-21 ENCOUNTER — Emergency Department (HOSPITAL_COMMUNITY)
Admission: EM | Admit: 2021-07-21 | Discharge: 2021-07-21 | Disposition: A | Discharge status: Home / Self Care | Payer: Medicare Other | Source: Home / Self Care | Attending: Emergency Medicine | Admitting: Emergency Medicine

## 2021-07-21 ENCOUNTER — Encounter (HOSPITAL_COMMUNITY): Payer: Self-pay | Admitting: Emergency Medicine

## 2021-07-21 ENCOUNTER — Inpatient Hospital Stay (HOSPITAL_COMMUNITY)
Admission: EM | Admit: 2021-07-21 | Discharge: 2021-08-03 | DRG: 552 | Disposition: A | Discharge status: Skilled Nursing Facility | Payer: Medicare Other | Attending: Internal Medicine | Admitting: Internal Medicine

## 2021-07-21 ENCOUNTER — Other Ambulatory Visit: Payer: Self-pay

## 2021-07-21 DIAGNOSIS — Z5900 Homelessness unspecified: Secondary | ICD-10-CM

## 2021-07-21 DIAGNOSIS — Z720 Tobacco use: Secondary | ICD-10-CM | POA: Diagnosis present

## 2021-07-21 DIAGNOSIS — Z79899 Other long term (current) drug therapy: Secondary | ICD-10-CM

## 2021-07-21 DIAGNOSIS — L299 Pruritus, unspecified: Secondary | ICD-10-CM | POA: Diagnosis present

## 2021-07-21 DIAGNOSIS — M4712 Other spondylosis with myelopathy, cervical region: Principal | ICD-10-CM | POA: Diagnosis present

## 2021-07-21 DIAGNOSIS — F259 Schizoaffective disorder, unspecified: Secondary | ICD-10-CM | POA: Clinically undetermined

## 2021-07-21 DIAGNOSIS — R269 Unspecified abnormalities of gait and mobility: Secondary | ICD-10-CM | POA: Diagnosis present

## 2021-07-21 DIAGNOSIS — M40202 Unspecified kyphosis, cervical region: Secondary | ICD-10-CM | POA: Diagnosis present

## 2021-07-21 DIAGNOSIS — Z888 Allergy status to other drugs, medicaments and biological substances status: Secondary | ICD-10-CM

## 2021-07-21 DIAGNOSIS — Z59819 Housing instability, housed unspecified: Secondary | ICD-10-CM | POA: Insufficient documentation

## 2021-07-21 DIAGNOSIS — F4389 Other reactions to severe stress: Secondary | ICD-10-CM | POA: Insufficient documentation

## 2021-07-21 DIAGNOSIS — M069 Rheumatoid arthritis, unspecified: Secondary | ICD-10-CM | POA: Diagnosis present

## 2021-07-21 DIAGNOSIS — G959 Disease of spinal cord, unspecified: Secondary | ICD-10-CM

## 2021-07-21 DIAGNOSIS — F17210 Nicotine dependence, cigarettes, uncomplicated: Secondary | ICD-10-CM | POA: Diagnosis present

## 2021-07-21 DIAGNOSIS — M62838 Other muscle spasm: Secondary | ICD-10-CM | POA: Diagnosis not present

## 2021-07-21 DIAGNOSIS — M4802 Spinal stenosis, cervical region: Secondary | ICD-10-CM | POA: Diagnosis present

## 2021-07-21 DIAGNOSIS — R21 Rash and other nonspecific skin eruption: Secondary | ICD-10-CM | POA: Diagnosis present

## 2021-07-21 DIAGNOSIS — R296 Repeated falls: Secondary | ICD-10-CM | POA: Diagnosis present

## 2021-07-21 DIAGNOSIS — Z9181 History of falling: Secondary | ICD-10-CM

## 2021-07-21 DIAGNOSIS — M40292 Other kyphosis, cervical region: Secondary | ICD-10-CM | POA: Diagnosis present

## 2021-07-21 DIAGNOSIS — L089 Local infection of the skin and subcutaneous tissue, unspecified: Secondary | ICD-10-CM | POA: Diagnosis present

## 2021-07-21 DIAGNOSIS — M791 Myalgia, unspecified site: Secondary | ICD-10-CM | POA: Insufficient documentation

## 2021-07-21 DIAGNOSIS — G4700 Insomnia, unspecified: Secondary | ICD-10-CM | POA: Diagnosis present

## 2021-07-21 DIAGNOSIS — M1990 Unspecified osteoarthritis, unspecified site: Secondary | ICD-10-CM | POA: Diagnosis present

## 2021-07-21 DIAGNOSIS — R531 Weakness: Principal | ICD-10-CM

## 2021-07-21 DIAGNOSIS — Z880 Allergy status to penicillin: Secondary | ICD-10-CM

## 2021-07-21 DIAGNOSIS — Z609 Problem related to social environment, unspecified: Secondary | ICD-10-CM | POA: Diagnosis present

## 2021-07-21 DIAGNOSIS — K5900 Constipation, unspecified: Secondary | ICD-10-CM | POA: Diagnosis present

## 2021-07-21 DIAGNOSIS — Z659 Problem related to unspecified psychosocial circumstances: Secondary | ICD-10-CM

## 2021-07-21 DIAGNOSIS — R5381 Other malaise: Secondary | ICD-10-CM

## 2021-07-21 DIAGNOSIS — Z20822 Contact with and (suspected) exposure to covid-19: Secondary | ICD-10-CM | POA: Diagnosis present

## 2021-07-21 DIAGNOSIS — F32A Depression, unspecified: Secondary | ICD-10-CM | POA: Diagnosis present

## 2021-07-21 MED ORDER — ACETAMINOPHEN 325 MG PO TABS
650.0000 mg | ORAL_TABLET | Freq: Once | ORAL | Status: AC
  Administered 2021-07-21: 650 mg via ORAL
  Filled 2021-07-21: qty 2

## 2021-07-21 NOTE — ED Notes (Signed)
Pt was cover in urine and BM on arrived, me and the nurse clean and change her, the bed at this moment pt is clean and dry.  ?

## 2021-07-21 NOTE — ED Triage Notes (Addendum)
Per EMS, Pt c/o weakness - she is unable to sit up in wheelchair.  She has been seen several times for the same.  She was picked up from [facility]. ? ?160/89 ?84Pulse ?16RR ?93%RA ? ?PA Greta Doom indicated that labs are not needed at this time. ?

## 2021-07-21 NOTE — ED Triage Notes (Signed)
Pt to ER via EMS, pt is homeless and well known to this facility.  Pt c/o left hip pain and neck pain for several days.  Pt arrives in paper scrubs that are well worn.  Pt is in brief that is saturated. Pt cleaned, linens changed, clean gown placed on pt. ?

## 2021-07-21 NOTE — TOC Progression Note (Signed)
Transition of Care (TOC) - Progression Note  ? ? ?Patient Details  ?Name:  SJacqualin Combes  ?MRN: [id number] ?Date of Birth: 01/22/1951 ? ?Transition of Care (TOC) CM/SW Contact  ?Karrin Eisenmenger B Amirah Goerke, LCSWA ?Phone Number: ?07/21/2021, 11:37 AM ? ?Clinical Narrative:    ?CSW  received consult for transportation. CSW spoke with APS worker who stated she is trying to secure housing for pt but first has to get pt's SSI. At this time she has no recommendations for placement but says she knows the business that pt wants to go as she frequents it. RN made aware, pt to be provided a taxi voucher.  ? ? ?  ?  ? ?Expected Discharge Plan and Services ?  ?  ?  ?  ?  ?                ?  ?  ?  ?  ?  ?  ?  ?  ?  ?  ? ? ?Social Determinants of Health (SDOH) Interventions ?  ? ?Readmission Risk Interventions ?   ? View : No data to display.  ?  ?  ?  ? ? ?

## 2021-07-21 NOTE — ED Provider Notes (Signed)
?[facility]-EMERGENCY DEPT ?Provider Note ? ? ?CSN: [id number] ?Arrival date & time: 07/21/21  0820 ? ?  ? ?History ? ?Chief Complaint  ?Patient presents with  ? body pain  ? ? ?Jacqualin CombesRebecca S Herandez is a 71 y.o. female. ? ?Patient presents indicates body/muscle pain. Indicates symptoms present on chronic/recurrent basis. Denies acute trauma or fall. No new radicular pain or any associated numbness/weakness. Denies headache. No chest pain or sob. No abd pain or nvd. No fever or chills. No dysuria or gu c/o. Normal appetite.  ? ?The history is provided by the patient and medical records.  ? ?  ? ?Home Medications ?Prior to Admission medications   ?Medication Sig Start Date End Date Taking? Authorizing Provider  ?ibuprofen (ADVIL) 800 MG tablet Take 1 tablet (800 mg total) by mouth every 8 (eight) hours as needed for moderate pain. ?Patient not taking: Reported on 07/16/2021 07/13/21   Bethann BerkshireZammit, Joseph, MD  ?   ? ?Allergies    ?Methotrexate derivatives and Penicillins   ? ?Review of Systems   ?Review of Systems  ?Constitutional:  Negative for chills and fever.  ?HENT:  Negative for sore throat.   ?Eyes:  Negative for pain, redness and visual disturbance.  ?Respiratory:  Negative for cough and shortness of breath.   ?Cardiovascular:  Negative for chest pain.  ?Gastrointestinal:  Negative for abdominal pain and vomiting.  ?Genitourinary:  Negative for dysuria.  ?Musculoskeletal:  Positive for myalgias.  ?Skin:  Negative for rash.  ?Neurological:  Negative for weakness, numbness and headaches.  ?Hematological:  Does not bruise/bleed easily.  ?Psychiatric/Behavioral:  Negative for agitation.   ? ?Physical Exam ?Updated Vital Signs ?BP (!) 143/89   Pulse 74   Temp (!) 97.4 ?F (36.3 ?C)   Resp 18   Ht 1.524 m (5')   Wt 61.2 kg   SpO2 96%   BMI 26.35 kg/m?  ?Physical Exam ?Vitals and nursing note reviewed.  ?Constitutional:   ?   Appearance: Normal appearance. She is well-developed.  ?HENT:  ?   Head:  Atraumatic.  ?   Nose: Nose normal.  ?   Mouth/Throat:  ?   Mouth: Mucous membranes are moist.  ?Eyes:  ?   General: No scleral icterus. ?   Conjunctiva/sclera: Conjunctivae normal.  ?Neck:  ?   Trachea: No tracheal deviation.  ?Cardiovascular:  ?   Rate and Rhythm: Normal rate and regular rhythm.  ?   Pulses: Normal pulses.  ?   Heart sounds: Normal heart sounds. No murmur heard. ?  No friction rub. No gallop.  ?Pulmonary:  ?   Effort: Pulmonary effort is normal. No respiratory distress.  ?   Breath sounds: Normal breath sounds.  ?Abdominal:  ?   General: Bowel sounds are normal. There is no distension.  ?   Palpations: Abdomen is soft.  ?   Tenderness: There is no abdominal tenderness.  ?Genitourinary: ?   Comments: No cva tenderness.  ?Musculoskeletal:     ?   General: No swelling.  ?   Cervical back: Normal range of motion and neck supple. No rigidity. No muscular tenderness.  ?   Comments: CTLS spine, non tender, aligned, no step off. ?Good rom bil extremities without pain or focal bony tenderness.   ?Skin: ?   General: Skin is warm and dry.  ?   Findings: No rash.  ?Neurological:  ?   Mental Status: She is alert.  ?   Comments: Alert, speech normal. Motor/sens  grossly intact bil.   ?Psychiatric:     ?   Mood and Affect: Mood normal.  ? ? ?ED Results / Procedures / Treatments   ?Labs ?(all labs ordered are listed, but only abnormal results are displayed) ?Labs Reviewed - No data to display ? ?EKG ?None ? ?Radiology ?No results found. ? ?Procedures ?Procedures  ? ? ?Medications Ordered in ED ?Medications  ?acetaminophen (TYLENOL) tablet 650 mg (has no administration in time range)  ? ? ?ED Course/ Medical Decision Making/ A&P ?  ?                        ?Medical Decision Making ?Problems Addressed: ?Housing instability: chronic illness or injury that poses a threat to life or bodily functions ?Myalgia: chronic illness or injury with exacerbation, progression, or side effects of treatment ?Other social stressor:  chronic illness or injury ? ?Amount and/or Complexity of Data Reviewed ?External Data Reviewed: notes. ? ?Risk ?OTC drugs. ? ? ?Reviewed nursing notes and prior charts for additional history. Multiple prior ED visits and other external records reviewed.  ? ?Acetaminophen po, po fluids/food. ? ?Pt indicates is followed by APS/care coordinator and she is working with them to pursue more stable living situation. ? ?Pt with normal mood and affect. Does not appear acutely depressed or despondent.  Pt does not appear to be responding to internal stimuli - no acute psychosis noted.  ? ?Pt currently appears stable for d/c. ? ?Rec close pcp f/u, and f/u with her care worker.  ? ?Resource guide provided for additional community resources.  ? ?Return precautions provided.  ? ? ? ? ? ? ? ? ? ?Final Clinical Impression(s) / ED Diagnoses ?Final diagnoses:  ?Myalgia  ?Other social stressor  ?Housing instability  ? ? ?Rx / DC Orders ?ED Discharge Orders   ? ? None  ? ?  ? ? ?  ?Cathren Laine, MD ?07/21/21 1010 ? ?

## 2021-07-21 NOTE — Discharge Instructions (Signed)
It was our pleasure to provide your ER care today - we hope that you feel better. ? ?Drink plenty of fluids/stay well hydrated. ? ?Take acetaminophen or ibuprofen as need.  ? ?Follow up with primary care doctor in the coming week. ? ?Follow up closely with your care coordinator/APS rep in the next couple days. ? ?For mental health issues and/or crisis, you may go directly to [facility] - it is open 24/7 and walk-ins are welcome.  ? ?Return to ER if worse, new symptoms, fevers, chest pain, trouble breathing, or other concern.  ?

## 2021-07-22 ENCOUNTER — Emergency Department (HOSPITAL_COMMUNITY): Payer: Medicare Other

## 2021-07-22 ENCOUNTER — Encounter (HOSPITAL_COMMUNITY): Payer: Self-pay

## 2021-07-22 DIAGNOSIS — Z20822 Contact with and (suspected) exposure to covid-19: Secondary | ICD-10-CM | POA: Diagnosis present

## 2021-07-22 DIAGNOSIS — M40292 Other kyphosis, cervical region: Secondary | ICD-10-CM | POA: Diagnosis present

## 2021-07-22 DIAGNOSIS — Z720 Tobacco use: Secondary | ICD-10-CM | POA: Diagnosis not present

## 2021-07-22 DIAGNOSIS — M4712 Other spondylosis with myelopathy, cervical region: Principal | ICD-10-CM

## 2021-07-22 DIAGNOSIS — F17210 Nicotine dependence, cigarettes, uncomplicated: Secondary | ICD-10-CM | POA: Diagnosis present

## 2021-07-22 DIAGNOSIS — F259 Schizoaffective disorder, unspecified: Secondary | ICD-10-CM | POA: Diagnosis not present

## 2021-07-22 DIAGNOSIS — M1990 Unspecified osteoarthritis, unspecified site: Secondary | ICD-10-CM | POA: Diagnosis present

## 2021-07-22 DIAGNOSIS — R269 Unspecified abnormalities of gait and mobility: Secondary | ICD-10-CM | POA: Diagnosis present

## 2021-07-22 DIAGNOSIS — M62838 Other muscle spasm: Secondary | ICD-10-CM | POA: Diagnosis not present

## 2021-07-22 DIAGNOSIS — L089 Local infection of the skin and subcutaneous tissue, unspecified: Secondary | ICD-10-CM | POA: Diagnosis present

## 2021-07-22 DIAGNOSIS — L299 Pruritus, unspecified: Secondary | ICD-10-CM | POA: Diagnosis present

## 2021-07-22 DIAGNOSIS — Z888 Allergy status to other drugs, medicaments and biological substances status: Secondary | ICD-10-CM | POA: Diagnosis not present

## 2021-07-22 DIAGNOSIS — F32A Depression, unspecified: Secondary | ICD-10-CM | POA: Diagnosis present

## 2021-07-22 DIAGNOSIS — Z5900 Homelessness unspecified: Secondary | ICD-10-CM | POA: Diagnosis not present

## 2021-07-22 DIAGNOSIS — Z609 Problem related to social environment, unspecified: Secondary | ICD-10-CM | POA: Diagnosis present

## 2021-07-22 DIAGNOSIS — R21 Rash and other nonspecific skin eruption: Secondary | ICD-10-CM | POA: Diagnosis present

## 2021-07-22 DIAGNOSIS — M069 Rheumatoid arthritis, unspecified: Secondary | ICD-10-CM | POA: Diagnosis present

## 2021-07-22 DIAGNOSIS — Z880 Allergy status to penicillin: Secondary | ICD-10-CM | POA: Diagnosis not present

## 2021-07-22 DIAGNOSIS — M40202 Unspecified kyphosis, cervical region: Secondary | ICD-10-CM | POA: Diagnosis present

## 2021-07-22 DIAGNOSIS — G4700 Insomnia, unspecified: Secondary | ICD-10-CM | POA: Diagnosis present

## 2021-07-22 DIAGNOSIS — M4802 Spinal stenosis, cervical region: Secondary | ICD-10-CM | POA: Diagnosis present

## 2021-07-22 DIAGNOSIS — Z59819 Housing instability, housed unspecified: Secondary | ICD-10-CM | POA: Diagnosis not present

## 2021-07-22 DIAGNOSIS — G9520 Unspecified cord compression: Secondary | ICD-10-CM | POA: Diagnosis present

## 2021-07-22 DIAGNOSIS — R296 Repeated falls: Secondary | ICD-10-CM | POA: Diagnosis present

## 2021-07-22 DIAGNOSIS — R5381 Other malaise: Secondary | ICD-10-CM | POA: Diagnosis present

## 2021-07-22 DIAGNOSIS — Z79899 Other long term (current) drug therapy: Secondary | ICD-10-CM | POA: Diagnosis not present

## 2021-07-22 DIAGNOSIS — K5900 Constipation, unspecified: Secondary | ICD-10-CM | POA: Diagnosis present

## 2021-07-22 LAB — SEDIMENTATION RATE: Sed Rate: 10 mm/hr (ref 0–22)

## 2021-07-22 LAB — CBC WITH DIFFERENTIAL/PLATELET
Abs Immature Granulocytes: 0.02 10*3/uL (ref 0.00–0.07)
Basophils Absolute: 0.1 10*3/uL (ref 0.0–0.1)
Basophils Relative: 1 %
Eosinophils Absolute: 0.3 10*3/uL (ref 0.0–0.5)
Eosinophils Relative: 3 %
HCT: 40.3 % (ref 36.0–46.0)
Hemoglobin: 13.2 g/dL (ref 12.0–15.0)
Immature Granulocytes: 0 %
Lymphocytes Relative: 25 %
Lymphs Abs: 2.2 10*3/uL (ref 0.7–4.0)
MCH: 31.7 pg (ref 26.0–34.0)
MCHC: 32.8 g/dL (ref 30.0–36.0)
MCV: 96.9 fL (ref 80.0–100.0)
Monocytes Absolute: 0.7 10*3/uL (ref 0.1–1.0)
Monocytes Relative: 8 %
Neutro Abs: 5.5 10*3/uL (ref 1.7–7.7)
Neutrophils Relative %: 63 %
Platelets: 375 10*3/uL (ref 150–400)
RBC: 4.16 MIL/uL (ref 3.87–5.11)
RDW: 13.1 % (ref 11.5–15.5)
WBC: 8.8 10*3/uL (ref 4.0–10.5)
nRBC: 0 % (ref 0.0–0.2)

## 2021-07-22 LAB — PROTIME-INR
INR: 1.1 (ref 0.8–1.2)
Prothrombin Time: 13.7 seconds (ref 11.4–15.2)

## 2021-07-22 LAB — COMPREHENSIVE METABOLIC PANEL
ALT: 15 U/L (ref 0–44)
AST: 27 U/L (ref 15–41)
Albumin: 3.2 g/dL — ABNORMAL LOW (ref 3.5–5.0)
Alkaline Phosphatase: 81 U/L (ref 38–126)
Anion gap: 11 (ref 5–15)
BUN: 17 mg/dL (ref 8–23)
CO2: 30 mmol/L (ref 22–32)
Calcium: 9.2 mg/dL (ref 8.9–10.3)
Chloride: 99 mmol/L (ref 98–111)
Creatinine, Ser: 0.66 mg/dL (ref 0.44–1.00)
GFR, Estimated: >60 mL/min (ref >60)
Glucose, Bld: 93 mg/dL (ref 70–99)
Potassium: 4 mmol/L (ref 3.5–5.1)
Sodium: 140 mmol/L (ref 135–145)
Total Bilirubin: 0.8 mg/dL (ref 0.3–1.2)
Total Protein: 6.4 g/dL — ABNORMAL LOW (ref 6.5–8.1)

## 2021-07-22 LAB — C-REACTIVE PROTEIN: CRP: 1.2 mg/dL — ABNORMAL HIGH (ref <1.0)

## 2021-07-22 LAB — ETHANOL: Alcohol, Ethyl (B): <10 mg/dL (ref <10)

## 2021-07-22 LAB — PROCALCITONIN: Procalcitonin: <0.1 ng/mL

## 2021-07-22 MED ORDER — ENOXAPARIN SODIUM 40 MG/0.4ML IJ SOSY
40.0000 mg | PREFILLED_SYRINGE | INTRAMUSCULAR | Status: DC
  Administered 2021-07-22 – 2021-07-30 (×6): 40 mg via SUBCUTANEOUS
  Filled 2021-07-22 (×10): qty 0.4

## 2021-07-22 MED ORDER — HYDROCODONE-ACETAMINOPHEN 5-325 MG PO TABS
1.0000 | ORAL_TABLET | Freq: Four times a day (QID) | ORAL | Status: DC | PRN
  Administered 2021-07-22 – 2021-07-25 (×11): 1 via ORAL
  Filled 2021-07-22 (×11): qty 1

## 2021-07-22 MED ORDER — OLANZAPINE 10 MG PO TABS
10.0000 mg | ORAL_TABLET | Freq: Every day | ORAL | Status: DC
  Administered 2021-07-22 – 2021-08-02 (×12): 10 mg via ORAL
  Filled 2021-07-22 (×13): qty 1

## 2021-07-22 MED ORDER — NICOTINE 21 MG/24HR TD PT24
21.0000 mg | MEDICATED_PATCH | Freq: Every day | TRANSDERMAL | Status: DC
Start: 2021-07-22 — End: 2021-08-03
  Administered 2021-07-22 – 2021-08-03 (×13): 21 mg via TRANSDERMAL
  Filled 2021-07-22 (×13): qty 1

## 2021-07-22 MED ORDER — ZOLPIDEM TARTRATE 5 MG PO TABS
5.0000 mg | ORAL_TABLET | Freq: Every evening | ORAL | Status: DC | PRN
  Administered 2021-07-22: 5 mg via ORAL
  Filled 2021-07-22: qty 1

## 2021-07-22 MED ORDER — ACETAMINOPHEN 650 MG RE SUPP: 650.0000 mg | Freq: Four times a day (QID) | RECTAL | Status: DC | PRN

## 2021-07-22 MED ORDER — ACETAMINOPHEN 325 MG PO TABS
650.0000 mg | ORAL_TABLET | Freq: Four times a day (QID) | ORAL | Status: DC | PRN
  Administered 2021-07-25 – 2021-07-30 (×3): 650 mg via ORAL
  Filled 2021-07-22 (×4): qty 2

## 2021-07-22 MED ORDER — DEXAMETHASONE SODIUM PHOSPHATE 10 MG/ML IJ SOLN
10.0000 mg | Freq: Once | INTRAMUSCULAR | Status: AC
  Administered 2021-07-22: 10 mg via INTRAVENOUS
  Filled 2021-07-22: qty 1

## 2021-07-22 MED ORDER — SODIUM CHLORIDE 0.9% FLUSH
3.0000 mL | Freq: Two times a day (BID) | INTRAVENOUS | Status: DC
  Administered 2021-07-22 – 2021-08-03 (×24): 3 mL via INTRAVENOUS

## 2021-07-22 MED ORDER — DICLOFENAC SODIUM 1 % EX GEL
2.0000 g | Freq: Four times a day (QID) | CUTANEOUS | Status: DC
  Administered 2021-07-22 – 2021-07-25 (×12): 2 g via TOPICAL
  Filled 2021-07-22: qty 100

## 2021-07-22 MED ORDER — ALBUTEROL SULFATE (2.5 MG/3ML) 0.083% IN NEBU: 2.5000 mg | INHALATION_SOLUTION | Freq: Four times a day (QID) | RESPIRATORY_TRACT | Status: DC | PRN

## 2021-07-22 MED ORDER — SACCHAROMYCES BOULARDII 250 MG PO CAPS
250.0000 mg | ORAL_CAPSULE | Freq: Two times a day (BID) | ORAL | Status: DC
  Administered 2021-07-22 – 2021-08-03 (×24): 250 mg via ORAL
  Filled 2021-07-22 (×25): qty 1

## 2021-07-22 MED ORDER — GABAPENTIN 100 MG PO CAPS
100.0000 mg | ORAL_CAPSULE | Freq: Three times a day (TID) | ORAL | Status: DC
  Administered 2021-07-22 – 2021-08-03 (×29): 100 mg via ORAL
  Filled 2021-07-22 (×35): qty 1

## 2021-07-22 MED ORDER — ONDANSETRON HCL 4 MG/2ML IJ SOLN: 4.0000 mg | Freq: Four times a day (QID) | INTRAMUSCULAR | Status: DC | PRN

## 2021-07-22 MED ORDER — SODIUM CHLORIDE 0.9 % IV SOLN
1.0000 g | INTRAVENOUS | Status: DC
  Administered 2021-07-22 – 2021-07-25 (×4): 1 g via INTRAVENOUS
  Filled 2021-07-22 (×4): qty 10

## 2021-07-22 MED ORDER — CLOBETASOL PROPIONATE 0.05 % EX OINT
TOPICAL_OINTMENT | Freq: Two times a day (BID) | CUTANEOUS | Status: DC
  Administered 2021-07-22 – 2021-08-01 (×3): 1 via TOPICAL
  Filled 2021-07-22: qty 15

## 2021-07-22 MED ORDER — ONDANSETRON HCL 4 MG PO TABS: 4.0000 mg | ORAL_TABLET | Freq: Four times a day (QID) | ORAL | Status: DC | PRN

## 2021-07-22 NOTE — H&P (Addendum)
?History and Physical  ? ? ?Patient: Angela Medina TZG:[id number] DOB: 09/02/1950 ?DOA: 07/21/2021 ?DOS: the patient was seen and examined on 07/22/2021 ?PCP: Patient, No Pcp Per (Inactive)  ?Patient coming from: Homeless via EMS ? ?Chief Complaint:  ?Chief Complaint  ?Patient presents with  ? Weakness  ? ?HPI: Angela Medina is a 71 y.o. female with medical history significant of schizoaffective disorder and is currently homeless presents with complaints of progressively worsening weakness.  History is somewhat difficult to obtain from the patient as she jumps around from subject to subject.  She reports that her left leg is completely numb and feels like it is dead. She has pain in her left hip that she relates to a prior fall, reports it has not gotten better, and she cannot walk due to it all.  She has numbness and tingling arms and is unable to raise her arms above a certain point.  Patient reports decreased ability to grabs things with her hands for which she almost at the point of being unable to do anything for herself.  Associated symptoms include neck pain, shoulder pain, mild productive cough, and this new rash that is itchy that started in the last 2 to 3 days.  The rash is on her abdomen and upper leg for which she feels it is related to leprosy.  She reports that she understands she has an issue with her spine, but previously was told by a psychiatrist never to have any procedure on her neck because it small.  When asked to clarify the rationale and she states something different, but then says she told him not to have surgery.  At this time she reports that she smokes about a pack of cigarettes per day on average and denies any alcohol use. ? ? ?Upon admission to the emergency department patient was seen to be afebrile with pulse 43-91, blood pressure 94/67 -143/89, and all other vital signs maintained.  Labs were relatively unremarkable.  X-rays of the hip and pelvis did not show any acute  abnormality.  MRI of the cervical spine significant for degenerative spondylolysis at C3-C4 with severe spinal stenosis and evidence of cord compression with suspected patchy cord signal abnormality at the level of the superior extension to at least C2-3 concerning for compressive myelopathy.  Dr. Reatha Armour of neurosurgery was consulted.  Patient has been given Decadron 10 mg IV.  TRH called to admit. ? ?Review of Systems: As mentioned in the history of present illness. All other systems reviewed and are negative. ?Past Medical History:  ?Diagnosis Date  ? Aneurysm ([place])   ? Depression   ? Schizoaffective disorder ([place])   ? Vision changes   ? right eye  ? ?Past Surgical History:  ?Procedure Laterality Date  ? FOOT SURGERY    ? ?Social History:  reports that she has been smoking cigarettes. She has never used smokeless tobacco. She reports current alcohol use. She reports that she does not use drugs. ? ?Allergies  ?Allergen Reactions  ? Methotrexate Derivatives Other (See Comments)  ?  Hair  Fall  out  ? Penicillins Other (See Comments)  ?  Unknown- childhood allergy  ? ? ?History reviewed. No pertinent family history. ? ?Prior to Admission medications   ?Medication Sig Start Date End Date Taking? Authorizing Provider  ?acetaminophen (TYLENOL) 500 MG tablet Take 1,000 mg by mouth every 6 (six) hours as needed for moderate pain or headache.   Yes [provider]  ?OLANZapine (ZYPREXA) 10  MG tablet Take 10 mg by mouth at bedtime.   Yes [provider]  ?ibuprofen (ADVIL) 800 MG tablet Take 1 tablet (800 mg total) by mouth every 8 (eight) hours as needed for moderate pain. ?Patient not taking: Reported on 07/16/2021 07/13/21   Milton Ferguson, MD  ? ? ?Physical Exam: ?Vitals:  ? 07/22/21 0500 07/22/21 0515 07/22/21 0530 07/22/21 0630  ?BP: 122/79 116/66 127/70 94/67  ?Pulse: 64 65 65 61  ?Resp:   16 16  ?Temp:      ?TempSrc:      ?SpO2: 91% 94% 97% 96%  ? ? ?Constitutional: Disheveled elderly lady able to  follow commands. ?Eyes: Disconjugate gaze.    ?ENMT: Mucous membranes are moist. Posterior pharynx clear of any exudate or lesions.Normal dentition.  ?Neck: normal, supple, no masses, no thyromegaly ?Respiratory: clear to auscultation bilaterally, no wheezing, no crackles. Normal respiratory effort. No accessory muscle use.  ?Cardiovascular: Regular rate and rhythm, no murmurs / rubs / gallops. No extremity edema.   ?Abdomen: Umbilical hernia present that is not easily reducible.  Bowel sounds present. ?Musculoskeletal: Cervical kyphosis appreciated.  Ulnar deviation of the bilateral hands with arthritic nodules and changes of the hands and feet ?Skin: Development of this rash with surrounding erythema appreciated of the abdomen appreciated of the abdomen and reported upper leg with surrounding erythema as seen below ? ? ? ?Neurologic: CN 2-12 grossly intact.  Bilateral upper extremities 3/5, lower extremities 2/5. ?Psychiatric: Alert and oriented x3, but scattered thought process. ?Data Reviewed: ? ?EKG reveals sinus rhythm at 56 bpm with left anterior fascicular block ? ?Assessment and Plan: ?* Cervical spondylosis with myelopathy ?Patient presented with complaints of weakness with numbness and tingling sensation of the upper and lower extremities.  Imaging significant for severe stenosis at C3-C4.  Dr. Reatha Armour of neurosurgery consulted, and had discussed surgical intervention with the patient given symptoms.  However, patient declined any surgical intervention, but rationale was not totally understood.  Patient had been given Decadron 10 mg IV x1 dose.  No further steroids were recommended. ?-Admit to a medical telemetry bed ?-Gabapentin 100 mg 3 times daily ?-Hydrocodone as needed for pain ?-PT/OT consulted ?-Transitions of care consulted as patient likely needs placement ? ?Skin infection ?Patient has a skin infection that she describes as itchy in nature of her abdomen and upper leg that she is concerned for  leprosy.  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 ?-Check ESR and CRP  ?-Clobetasol cream to affected lesions ?-Placed on empiric antibiotics of Rocephin in case of underlying bacterial infection ? ?Schizoaffective disorder ([place]) ?Patient has a history of schizoaffective disorder and had been on olanzapine in the past. ?-Resume Olanzapine ?-Psychiatry consulted for decision-making capacity ? ?Arthritis ?Patient has significant ulnar deviation with nodules of the bilateral hands as well as nodules on her feet. ?-Check rheumatoid factor ? ?Tobacco abuse ?Patient smokes a pack of cigarettes per day on average. ?-Nicotine patch offered ?-Continue to encourage cessation of tobacco use ? ? ?Advance Care Planning:   Code Status: Prior  ? ?Consults: Neurosurgery ? ?Family Communication: Patient stated to only contact her sister if she passes away because she knows what to do thereafter ? ?Severity of Illness: ?The appropriate patient status for this patient is INPATIENT. Inpatient status is judged to be reasonable and necessary in order to provide the required intensity of service to ensure the patient's safety. The patient's presenting symptoms, physical exam findings, and initial radiographic and  laboratory data in the context of their chronic comorbidities is felt to place them at high risk for further clinical deterioration. Furthermore, it is not anticipated that the patient will be medically stable for discharge from the hospital within 2 midnights of admission.  ? ?* I certify that at the point of admission it is my clinical judgment that the patient will require inpatient hospital care spanning beyond 2 midnights from the point of admission due to high intensity of service, high risk for further deterioration and high frequency of surveillance required.* ? ?Author: ?Norval Morton, MD ?07/22/2021 8:57 AM ? ?For on call review [email or web address].  ?

## 2021-07-22 NOTE — Plan of Care (Signed)
  Problem: Education: Goal: Knowledge of General Education information will improve Description Including pain rating scale, medication(s)/side effects and non-pharmacologic comfort measures Outcome: Progressing   

## 2021-07-22 NOTE — Progress Notes (Signed)
Skin assessment:  pt has a red/white rash on her chest in between her breast and it goes down to the umbilical area.  Ointment applied per orders.  Bilateral legs noted with red scaps on them.  No drainage or odor noted.  Pt also has scratches on her arm that is puprle/red in color. ?

## 2021-07-22 NOTE — Consult Note (Signed)
? ?  Providing Compassionate, Quality Care - Together ? ?Neurosurgery Consult  ?Referring physician: Dr. Tamala Julian ?Reason for referral: Cervical myelopathy ? ?Chief Complaint: Inability to ambulate, upper and lower extremity weakness ? ?History of Present Illness: This is a 71 year old female, unfortunately homeless, that states she has had progressive worsening weakness, numbness, tingling in her arms and legs as well as inability to walk for months.  She also complains of a shooting lightening sensation through her whole body with certain movements.  She denies any new recent fall or trauma.  Denies any fevers. ? ?She is somewhat of a poor historian ? ?Medications: I have reviewed the patient's current medications. ?Allergies: No Known Allergies ? ?History reviewed. No pertinent family history. ?Social History:  has no history on file for tobacco use, alcohol use, and drug use. ? ?ROS: ?All pertinent positives and negatives are listed in HPI above ? ?Physical Exam:  ?Vital signs in last 24 hours: ?Temp:  [98 ?F (36.7 ?C)-98.3 ?F (36.8 ?C)] 98 ?F (36.7 ?C) (07/25 1814) ?Pulse Rate:  [58-128] 65 (07/26 0746) ?Resp:  [11-18] 14 (07/26 0217) ?BP: (138-182)/(65-125) 153/88 (07/26 0700) ?SpO2:  [91 %-98 %] 96 % (07/26 0746) ?PE: ?Awake alert oriented x2 ?Disheveled and malnourished appearing ?PERRLA ?Speech fluent and appropriate, slightly tangential thoughts ?Bilateral upper extremity 3/5 ?Bilateral lower extremity 2/5 throughout ?Bilateral Hoffmann's ?DTRs 3/4 throughout ?Interossei wasting ? ? ? ?Impression/Assessment:  ?71 year old female with ? ?Cervical spondylotic myelopathy with severe stenosis ? ?Plan:  ?-MRI cervical spine reviewed, significant motion artifact however there appears to be severe stenosis at C3-4 due to degenerative anterior listhesis and significant kyphosis.  There is severe degenerative changes throughout with moderate to severe foraminal narrowing C4-C8.  There appears to be cord signal change  at C3-4 reaching near the C2-3 disc base. ?-I explained all these findings to the patient and discussed with her about surgical intervention given her progressive myelopathic symptoms.  She states someone told her previously that she would ultimately develop this and she is not interested in any surgical intervention at this time.  I discussed that there is a high likelihood that her symptoms will progress, she will continue to have weakness and inability to walk, inability to feed herself.  She states she has been unable to walk for quite some time, has significant difficulty feeding herself. ?-Again she expresses no interest in surgical intervention whatsoever ?-She states she would rather let the symptoms progress and it end her life. ?-Given this I do not recommend any surgical intervention at this time.  We will follow along, unfortunately she has a very complicated social situation. ? ? ? ?Thank you for allowing me to participate in this patient's care.  Please do not hesitate to call with questions or concerns. ? ? ?Taevyn Hausen, DO ?Neurosurgeon ?[facility] ?Cell: [phone] ? ? ? ? ?

## 2021-07-22 NOTE — Assessment & Plan Note (Addendum)
Continue olanzapine 

## 2021-07-22 NOTE — ED Notes (Signed)
Patient transported to MRI 

## 2021-07-22 NOTE — Assessment & Plan Note (Addendum)
Patient has significant ulnar deviation with nodules of the bilateral hands as well as nodules on her feet. ?Robaxin for muscle spasms. ?

## 2021-07-22 NOTE — Assessment & Plan Note (Addendum)
Counseled

## 2021-07-22 NOTE — Assessment & Plan Note (Addendum)
Noted in abdominal area as well as upper leg.  Concern for cellulitis.  Completed 5-day course of Rocephin.   ?Continue daily wound care. ?

## 2021-07-22 NOTE — Assessment & Plan Note (Addendum)
-  MRI cervical spine showed severe stenosis at C3-C4 level due to degenerative anterolisthesis and significant kyphosis.   ?-Neurosurgery was consulted.    Discussion was held with patient regarding surgical intervention by neurosurgery as well as previous rounding MDs.  She declined surgery.  Even though psychiatry said patient may not have capacity to decide regarding surgery neurosurgery did not feel that there was an emergent indication to force her to undergo surgical intervention. ?Symptomatic treatment including pain medications. ?Neurological status seems to be stable.  Skilled nursing facility for rehab was recommended.  She has a bed available today. ?

## 2021-07-22 NOTE — ED Provider Notes (Signed)
?[facility] ?Provider Note ? ? ?CSN: [id number] ?Arrival date & time: 07/21/21  2214 ? ?  ? ?History ? ?Chief Complaint  ?Patient presents with  ? Weakness  ? ? ?Angela CombesRebecca S Medina is a 71 y.o. female. ? ?The history is provided by the patient and medical records.  ?Weakness ?Angela Medina is a 71 y.o. female who presents to the Emergency Department complaining of weakness.  Level 5 caveat due to mental illness.  Patient is a poor and rambling historian.  She states that she was in her wheelchair that was given to her by the hospital.  She drank some V8 juice that she thinks poisoned her and made her too weak to sit up in the wheelchair.  Overall her symptoms are improving but she has persistent weakness compared to her baseline.  At baseline she has significant weakness and difficulty moving her legs, left greater than right.  She states that she has also has chronic weakness to both arms.  No chest pain, abdominal pain, vomiting.  She does have a history of frequent falls, last fall was several days ago.  No reports of neck or back pain. ?  ? ?Home Medications ?Prior to Admission medications   ?Medication Sig Start Date End Date Taking? Authorizing Provider  ?acetaminophen (TYLENOL) 500 MG tablet Take 1,000 mg by mouth every 6 (six) hours as needed for moderate pain or headache.   Yes [provider]  ?OLANZapine (ZYPREXA) 10 MG tablet Take 10 mg by mouth at bedtime.   Yes [provider]  ?ibuprofen (ADVIL) 800 MG tablet Take 1 tablet (800 mg total) by mouth every 8 (eight) hours as needed for moderate pain. ?Patient not taking: Reported on 07/16/2021 07/13/21   Angela Medina, Joseph, MD  ?   ? ?Allergies    ?Methotrexate derivatives and Penicillins   ? ?Review of Systems   ?Review of Systems  ?Neurological:  Positive for weakness.  ?All other systems reviewed and are negative. ? ?Physical Exam ?Updated Vital Signs ?BP 94/67   Pulse 61   Temp 98.4 ?F (36.9 ?C)  (Oral)   Resp 16   SpO2 96%  ?Physical Exam ?Vitals and nursing note reviewed.  ?Constitutional:   ?   Appearance: She is well-developed.  ?HENT:  ?   Head: Normocephalic and atraumatic.  ?Cardiovascular:  ?   Rate and Rhythm: Normal rate and regular rhythm.  ?   Heart sounds: No murmur heard. ?Pulmonary:  ?   Effort: Pulmonary effort is normal. No respiratory distress.  ?   Breath sounds: Normal breath sounds.  ?Abdominal:  ?   Palpations: Abdomen is soft.  ?   Tenderness: There is no abdominal tenderness. There is no guarding or rebound.  ?Musculoskeletal:     ?   General: No tenderness.  ?Skin: ?   General: Skin is warm and dry.  ?Neurological:  ?   Mental Status: She is alert and oriented to person, place, and time.  ?   Comments: 4 out of 5 strength in bilateral upper extremities in proximal and distal muscle groups.  3-4 out of 5 strength in the right lower extremity, 3/5 strength in the left lower extremity.  3+ patellar reflex in the right lower extremity, absent patellar reflex in the left lower extremity. ?No asymmetry of facial movements.  Right pupil is midsize and minimally reactive.  Left pupil is small and reactive.  Disconjugate gaze but tracks on attention.  Visual fields grossly  intact. (Pt states pupillary abnormality is chronic)  ?Psychiatric:  ?   Comments: Disorganized  ? ? ?ED Results / Procedures / Treatments   ?Labs ?(all labs ordered are listed, but only abnormal results are displayed) ?Labs Reviewed  ?COMPREHENSIVE METABOLIC PANEL - Abnormal; Notable for the following components:  ?    Result Value  ? Total Protein 6.4 (*)   ? Albumin 3.2 (*)   ? All other components within normal limits  ?CBC WITH DIFFERENTIAL/PLATELET  ?PROTIME-INR  ?ETHANOL  ?URINALYSIS, ROUTINE W REFLEX MICROSCOPIC  ?RAPID URINE DRUG SCREEN, HOSP PERFORMED  ? ? ?EKG ?None ? ?Radiology ?MR Cervical Spine Wo Contrast ? ?Result Date: 07/22/2021 ?CLINICAL DATA:  Initial evaluation for acute myelopathy. EXAM: MRI CERVICAL  SPINE WITHOUT CONTRAST TECHNIQUE: Multiplanar, multisequence MR imaging of the cervical spine was performed. No intravenous contrast was administered. COMPARISON:  Prior CT from 07/13/2021. FINDINGS: Alignment: Examination moderately to severely degraded by motion artifact, limiting assessment. Straightening with reversal of the normal cervical lordosis. 3 mm anterolisthesis of C2 on C3, 4 mm anterolisthesis of C3 on C4, with 5 mm anterolisthesis of C4 on C5. Additional trace stepwise anterolisthesis of C7 on T1 through T2 on T3. Findings likely chronic and facet mediated. Vertebrae: Vertebral body height maintained with no visible acute or chronic fracture. Bone marrow signal intensity grossly within normal limits. No visible discrete or worrisome osseous lesions. Discogenic reactive endplate changes present about the C3-4 and C4-5 interspaces. Probable reactive marrow edema about the C3-4 and C4-5 facets on the left due to facet arthritis. Cord: Suspected patchy signal abnormality involving the cervical cord at the levels of C3-4 and C4-5, likely compressive myelopathy given severe stenosis at this level. Patchy signal abnormality extends cephalad to the level of C2-3 (series 8, image 13) and possibly C1-2 (series 8, image 7) Posterior Fossa, vertebral arteries, paraspinal tissues: Visualized brain grossly within normal limits. Craniocervical junction normal. Edema within the left posterior paraspinous soft tissues at C3 through C5, likely reactive in nature due to adjacent facet arthritis (series 7, image 15). Paraspinous soft tissues demonstrate no other visible abnormality. Vascular flow void seen within the left vertebral artery. The right vertebral artery is not well seen. Disc levels: C2-C3: Anterolisthesis. No more than mild disc bulge. Severe bilateral facet arthrosis. No significant spinal stenosis. Probable mild left C3 foraminal narrowing. Right neural foramen remains grossly patent. C3-C4:  Anterolisthesis. Associated disc bulge with endplate spurring. Severe left worse than right facet arthrosis. There is resultant severe spinal stenosis with secondary cord compression. Probable cord signal changes as above. Severe left worse than right C4 foraminal stenosis. C4-C5: Diffuse disc bulge with endplate and uncovertebral spurring. Moderate left worse than right facet arthrosis. Moderate spinal stenosis. Severe left with moderate right C5 foraminal narrowing. C5-C6: Degenerative intervertebral disc space narrowing with diffuse disc osteophyte. Mild flattening of the ventral thecal sac without significant spinal stenosis. Mild to moderate right C6 foraminal narrowing. Left neural foramen is grossly patent. C6-C7: Degenerative intervertebral disc space narrowing with diffuse disc osteophyte complex. Mild flattening of the ventral thecal sac without significant spinal stenosis. Suspected mild to moderate left C7 foraminal narrowing. Right neural foramen remains grossly patent. C7-T1: Anterolisthesis with diffuse disc osteophyte complex. Bilateral facet hypertrophy. No significant spinal stenosis. Suspected at least moderate bilateral C8 foraminal stenosis. Visualized upper thoracic spine demonstrates no other significant finding. IMPRESSION: 1. Severely limited exam due to extensive motion artifact. 2. Degenerative spondylosis at C3-4 with resultant severe spinal stenosis and evidence of  cord compression. Suspected patchy cord signal abnormality at this level with superior extension to at least C2-3, concerning for compressive myelopathy. Neuro surgical consultation recommended. 3. Additional multilevel spondylosis with resultant moderate to severe bilateral C4 through C8 foraminal narrowing as above. 4. Reactive marrow edema about the left C3-4 and C4-5 facets, likely due to facet arthritis. Critical Value/emergent results were called by telephone at the time of interpretation on 07/22/2021 at 3:56 am to  provider [facility] , who verbally acknowledged these results. Electronically Signed   By: Rise Mu M.D.   On: 07/22/2021 04:02   ? ?Procedures ?Procedures  ? ?CRITICAL CARE ?Performed by: Tilden Fossa ? ? ?

## 2021-07-23 DIAGNOSIS — M4712 Other spondylosis with myelopathy, cervical region: Secondary | ICD-10-CM | POA: Diagnosis not present

## 2021-07-23 LAB — CBC
HCT: 36.1 % (ref 36.0–46.0)
Hemoglobin: 11.8 g/dL — ABNORMAL LOW (ref 12.0–15.0)
MCH: 31.8 pg (ref 26.0–34.0)
MCHC: 32.7 g/dL (ref 30.0–36.0)
MCV: 97.3 fL (ref 80.0–100.0)
Platelets: 371 10*3/uL (ref 150–400)
RBC: 3.71 MIL/uL — ABNORMAL LOW (ref 3.87–5.11)
RDW: 13.1 % (ref 11.5–15.5)
WBC: 7.6 10*3/uL (ref 4.0–10.5)
nRBC: 0 % (ref 0.0–0.2)

## 2021-07-23 LAB — RHEUMATOID FACTOR: Rheumatoid fact SerPl-aCnc: 16.6 IU/mL — ABNORMAL HIGH (ref <14.0)

## 2021-07-23 MED ORDER — POLYETHYLENE GLYCOL 3350 17 G PO PACK
17.0000 g | PACK | Freq: Once | ORAL | Status: DC
  Administered 2021-07-23: 17 g via ORAL
  Filled 2021-07-23: qty 1

## 2021-07-23 MED ORDER — MEDIHONEY WOUND/BURN DRESSING EX PSTE
1.0000 "application " | PASTE | Freq: Every day | CUTANEOUS | Status: DC
  Administered 2021-07-23 – 2021-08-03 (×11): 1 via TOPICAL
  Filled 2021-07-23: qty 44

## 2021-07-23 MED ORDER — MELATONIN 3 MG PO TABS
3.0000 mg | ORAL_TABLET | Freq: Every day | ORAL | Status: DC
  Administered 2021-07-23 – 2021-07-29 (×4): 3 mg via ORAL
  Filled 2021-07-23 (×6): qty 1

## 2021-07-23 NOTE — Progress Notes (Signed)
?PROGRESS NOTE ? ?Angela Medina  ?DOB: 10-26-1950  ?PCP: Patient, No Pcp Per (Inactive) ?GYF:[id number]  ?DOA: 07/21/2021 ? LOS: 1 day  ?Hospital Day: 3 ? ?Brief narrative: ?Angela Medina is a 71 y.o. female with PMH significant for homelessness, rheumatoid arthritis, schizoaffective disorder, depression, chronic impairment of mobility, smokes a pack of cigarette every day. ?She presented to the ED on 07/21/2021 with progressively worsening weakness, numbness, tingling in her arms and legs as well as inability to walk for months.  She also complains of shooting lightening sensation throughout her whole body with certain movements. ?She has numbness and tingling arms and is unable to raise her arms above a certain point.  Patient reports decreased ability to grabs things with her hands for which she almost at the point of being unable to do anything for herself.   ? ?In the ED, patient was afebrile, hemodynamically stable ?Labs unremarkable ?X-rays of the hip and pelvis did not show any acute abnormality.   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   ?Dr. Reatha Armour of neurosurgery was consulted.   ?Patient was given Decadron 10 mg IV.   ?Admitted to hospitalist service.  ? ?Subjective: ?Patient was seen and examined this morning.  Lying on bed.  Not in pain at rest but any movement would cause pain. ?On my evaluation, patient has clear understanding of the severity of her problems.  She states he has rheumatoid arthritis and was warned in the past that she may get ultimately paralyzed.  She does not want surgical intervention at all even if this leads to her death. ? ?Principal Problem: ?  Cervical spondylosis with myelopathy ?Active Problems: ?  Skin infection ?  Schizoaffective  disorder ([place]) ?  Tobacco abuse ?  Debility ?  Arthritis ?  ?Assessment and Plan: ?Cervical spondylitic myelopathy with severe stenosis  ?-MRI cervical spine showed severe stenosis at C3-C4 level due to degenerative anterolisthesis and significant kyphosis.  There is also severe degenerative changes throughout with moderate to severe foraminal narrowing and C4-C8.  There is cord signal change at C3-C4 reaching near the C2-C3 disc space. ?-Neurosurgery was consulted.  Patient was recommended surgical intervention given her progressive myelopathic symptoms.  Patient understands the severity of symptoms.  She states he has rheumatoid arthritis and was warned in the past that she may get ultimately paralyzed.  She does not want surgical intervention at all even if this leads to her death. ?-1 dose of IV Decadron was given in the ED.  Currently on Neurontin 100 mg 3 times daily and hydrocodone as needed. ? ?Skin infection ?-Patient has a skin infection that she describes as itchy in nature of her abdomen and upper leg.  Low ESR and CRP.  ?-On empiric IV Rocephin and clobetasol cream.  ?-Continue to monitor ?  ?Schizoaffective disorder  ?-Continue olanzapine ?-Psychiatry consulted ?  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 ?-Check rheumatoid factor ?  ?Tobacco abuse ?-Patient smokes a pack of cigarettes per day on average. ?-Nicotine patch offered ?-Continue to encourage cessation of tobacco use ? ?Goals of care ?  Code Status: Full Code  ? ? ?Mobility: Limited mobility.  PT eval ? ?Nutritional status:  ?Body mass index is 26.14  kg/m?.  ?  ?  ? ? ? ? ?Diet:  ?Diet Order   ? ?       ?  DIET DYS 3 Room service appropriate? Yes; Fluid consistency: Thin  Diet effective now       ?  ? ?  ?  ? ?  ? ? ?DVT prophylaxis:  ?enoxaparin (LOVENOX) injection 40 mg Start: 07/22/21 1200 ?  ?Antimicrobials: IV Rocephin ?Fluid: None ?Consultants: Neurosurgery,  psychiatry ?Family Communication: None at bedside ? ?Status is: Inpatient ? ?Continue in-hospital care because: Pending psychiatry neurosurgery final plan ?Level of care: Med-Surg  ? ?Dispo: The patient is from: Homeless ?             Anticipated d/c is to: Unclear at this time ?             Patient currently is not medically stable to d/c. ?  Difficult to place patient No ? ? ? ? ?Infusions:  ? cefTRIAXone (ROCEPHIN)  IV 1 g (07/23/21 1432)  ? ? ?Scheduled Meds: ? clobetasol ointment   Topical BID  ? diclofenac Sodium  2 g Topical QID  ? enoxaparin (LOVENOX) injection  40 mg Subcutaneous Q24H  ? gabapentin  100 mg Oral TID  ? leptospermum manuka honey  1 application. Topical Daily  ? nicotine  21 mg Transdermal Daily  ? OLANZapine  10 mg Oral QHS  ? saccharomyces boulardii  250 mg Oral BID  ? sodium chloride flush  3 mL Intravenous Q12H  ? ? ?PRN meds: ?acetaminophen **OR** acetaminophen, albuterol, HYDROcodone-acetaminophen, ondansetron **OR** ondansetron (ZOFRAN) IV, zolpidem  ? ?Antimicrobials: ?Anti-infectives (From admission, onward)  ? ? Start     Dose/Rate Route Frequency Ordered Stop  ? 07/22/21 1500  cefTRIAXone (ROCEPHIN) 1 g in sodium chloride 0.9 % 100 mL IVPB       ? 1 g ?200 mL/hr over 30 Minutes Intravenous Every 24 hours 07/22/21 1403 07/29/21 1459  ? ?  ? ? ?Objective: ?Vitals:  ? 07/23/21 0737 07/23/21 1432  ?BP: 124/71 131/66  ?Pulse: 60 67  ?Resp: 18 17  ?Temp: 98 ?F (36.7 ?C) 98.4 ?F (36.9 ?C)  ?SpO2: 100% 97%  ? ? ?Intake/Output Summary (Last 24 hours) at 07/23/2021 1500 ?Last data filed at 07/23/2021 1100 ?Gross per 24 hour  ?Intake 942.29 ml  ?Output --  ?Net 942.29 ml  ? ?Filed Weights  ? 07/22/21 1309  ?Weight: 62.4 kg  ? ?Weight change:  ?Body mass index is 26.14 kg/m?.  ? ?Physical Exam: ?General exam: Pleasant elderly Caucasian female ?Skin: No rashes, lesions or ulcers. ?HEENT: Atraumatic, normocephalic, no obvious bleeding ?Lungs: Clear to auscultation bilaterally ?CVS: Regular rate and  rhythm, no murmur ?GI/Abd soft, nontender, nondistended, bowel sound present ?CNS: Alert, awake, oriented x3 ?Psychiatry: Sad affect ?Extremities: No pedal edema, no calf tenderness, ? ?Data Review: I have personally reviewed the laboratory data and studies available. ? ?F/u labs ordered ?Unresulted Labs (From admission, onward)  ? ?  Start     Ordered  ? 07/24/21 0500  CBC with Differential/Platelet  Tomorrow morning,   R       ? 07/23/21 1500  ? 07/24/21 5643  Basic metabolic panel  Tomorrow morning,   R       ? 07/23/21 1500  ? 07/22/21 0358  Urinalysis, Routine w reflex microscopic  Once,   STAT       ? 07/22/21 0358  ? 07/22/21 0358  Urine rapid drug screen (hosp performed)  ONCE - STAT,   STAT       ? 07/22/21 0358  ? ?  ?  ? ?  ? ? ?Signed, ?Terrilee Croak, MD ?[facility] ?07/23/2021 ? ? ? ? ? ? ? ? ? ? ? ? ?

## 2021-07-23 NOTE — Consult Note (Signed)
WOC Nurse Consult Note: ?Reason for Consult: Blistering rash to abdomen and scabbed lesions to legs.  Abrasions to arms. Patient with recent functional decline and inability to walk.She is unhoused.  She has cervical and myelopathy and does not want surgical intervention. She understands that this will leave her unable to walk and feed herself.  Her choice is to allow progression of symptoms.  ?Wound type:inflammatory.  Admission due to hypothermia.  These blisters may have been a result of thermal injury while attempting to find warmth.  Patient is a poor historian.   ?She has abrasions to her toes and arms, consistent with trauma from falling due to her weakness.  ?Pressure Injury POA: NA ?Measurement: Linear abrasion from mid chest to umbilicus.  Blistering present, both intact, serum filled and ruptured.  1 cm round lesion near umbilicus has fibrin to wound bed.  ?Wound bed: see above ?Drainage (amount, consistency, odor) none noted ?Periwound:intact ?Dressing procedure/placement/frequency: Cleanse blistered rash and scabbed lesions to abdomen, right leg, lower legs and toes as well as arms with NS and pat dry.  Apply Medihoney to wound bed. Cover with foam dressings.  Peel back foam and reapply medihoney daily and change foam every three days.  ?Will not follow at this time.  Please re-consult if needed.  ?Maple Hudson MSN, RN, FNP-BC CWON ?Wound, Ostomy, Continence Nurse ?Pager [phone]  ?

## 2021-07-23 NOTE — TOC Progression Note (Signed)
Transition of Care (TOC) - Progression Note  ? ? ?Patient Details  ?Name: Angela Medina ?MRN: [id number] ?Date of Birth: 11/13/1950 ? ?Transition of Care (TOC) CM/SW Contact  ?Bartholomew Crews, RN ?Phone Number: [phone] ?07/23/2021, 4:05 PM ? ?Clinical Narrative:    ? ?Left voicemail for APS worker, Charise Carwin [phone]. Contact information provided including CSW contact.  ?  ?  ? ?Expected Discharge Plan and Services ?  ?  ?  ?  ?  ?                ?  ?  ?  ?  ?  ?  ?  ?  ?  ?  ? ? ?Social Determinants of Health (SDOH) Interventions ?  ? ?Readmission Risk Interventions ?   ? View : No data to display.  ?  ?  ?  ? ? ?
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
## 2021-07-24 DIAGNOSIS — M4712 Other spondylosis with myelopathy, cervical region: Secondary | ICD-10-CM | POA: Diagnosis not present

## 2021-07-24 LAB — CBC WITH DIFFERENTIAL/PLATELET
Abs Immature Granulocytes: 0.02 10*3/uL (ref 0.00–0.07)
Basophils Absolute: 0.1 10*3/uL (ref 0.0–0.1)
Basophils Relative: 1 %
Eosinophils Absolute: 0.2 10*3/uL (ref 0.0–0.5)
Eosinophils Relative: 3 %
HCT: 37.6 % (ref 36.0–46.0)
Hemoglobin: 11.7 g/dL — ABNORMAL LOW (ref 12.0–15.0)
Immature Granulocytes: 0 %
Lymphocytes Relative: 28 %
Lymphs Abs: 2.1 10*3/uL (ref 0.7–4.0)
MCH: 31.4 pg (ref 26.0–34.0)
MCHC: 31.1 g/dL (ref 30.0–36.0)
MCV: 100.8 fL — ABNORMAL HIGH (ref 80.0–100.0)
Monocytes Absolute: 1 10*3/uL (ref 0.1–1.0)
Monocytes Relative: 13 %
Neutro Abs: 4.2 10*3/uL (ref 1.7–7.7)
Neutrophils Relative %: 55 %
Platelets: 326 10*3/uL (ref 150–400)
RBC: 3.73 MIL/uL — ABNORMAL LOW (ref 3.87–5.11)
RDW: 13.3 % (ref 11.5–15.5)
WBC: 7.7 10*3/uL (ref 4.0–10.5)
nRBC: 0 % (ref 0.0–0.2)

## 2021-07-24 LAB — BASIC METABOLIC PANEL
Anion gap: 8 (ref 5–15)
BUN: 31 mg/dL — ABNORMAL HIGH (ref 8–23)
CO2: 26 mmol/L (ref 22–32)
Calcium: 8.3 mg/dL — ABNORMAL LOW (ref 8.9–10.3)
Chloride: 105 mmol/L (ref 98–111)
Creatinine, Ser: 0.8 mg/dL (ref 0.44–1.00)
GFR, Estimated: >60 mL/min (ref >60)
Glucose, Bld: 94 mg/dL (ref 70–99)
Potassium: 4.4 mmol/L (ref 3.5–5.1)
Sodium: 139 mmol/L (ref 135–145)

## 2021-07-24 MED ORDER — ALPRAZOLAM 0.25 MG PO TABS
0.2500 mg | ORAL_TABLET | Freq: Every evening | ORAL | Status: DC | PRN
  Administered 2021-07-24 – 2021-07-25 (×2): 0.25 mg via ORAL
  Filled 2021-07-24 (×2): qty 1

## 2021-07-24 MED ORDER — DIPHENHYDRAMINE HCL 25 MG PO CAPS: 25.0000 mg | ORAL_CAPSULE | Freq: Three times a day (TID) | ORAL | Status: DC | PRN

## 2021-07-24 MED ORDER — SENNOSIDES-DOCUSATE SODIUM 8.6-50 MG PO TABS
1.0000 | ORAL_TABLET | Freq: Every day | ORAL | Status: DC
Start: 2021-07-24 — End: 2021-07-30
  Administered 2021-07-24 – 2021-07-29 (×6): 1 via ORAL
  Filled 2021-07-24 (×6): qty 1

## 2021-07-24 MED ORDER — BISACODYL 5 MG PO TBEC: 10.0000 mg | DELAYED_RELEASE_TABLET | Freq: Every day | ORAL | Status: DC | PRN

## 2021-07-24 NOTE — Progress Notes (Signed)
CSW informed that [name] adult protective service worker, [place],  [phone], is requesting call.  CSW spoke with [place] who is trying to get [name] to social security so she can get another debit card to access her benefits.  [name] also making referral to [facility] for mental health case management.  Discussed current PT recommendation for placement. ?[name], MSW, LCSW ?4/4/20233:38 PM  ?

## 2021-07-24 NOTE — Progress Notes (Signed)
?PROGRESS NOTE ? ?Angela [facility]ina  ?DOB: December 14, 1950  ?PCP: Patient, No Pcp Per (Inactive) ?[id number]  ?DOA: 07/21/2021 ? LOS: 2 days  ?Hospital Day: 4 ? ?Brief narrative: ?Angela Medina is a 71 y.o. female with PMH significant for homelessness, rheumatoid arthritis, schizoaffective disorder, depression, chronic impairment of mobility, smokes a pack of cigarette every day. ?She presented to the ED on 07/21/2021 with progressively worsening weakness, numbness, tingling in her arms and legs as well as inability to walk for months.  She also complains of shooting lightening sensation throughout her whole body with certain movements. ?She has numbness and tingling arms and is unable to raise her arms above a certain point.  Patient reports decreased ability to grabs things with her hands for which she almost at the point of being unable to do anything for herself.   ? ?In the ED, patient was afebrile, hemodynamically stable ?Labs unremarkable ?X-rays of the hip and pelvis did not show any acute abnormality.   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   ?Dr. Reatha Armour of neurosurgery was consulted.   ?Patient was given Decadron 10 mg IV.   ?Admitted to hospitalist service.  ? ?Subjective: ?Patient was seen and examined this morning.  Lying on bed.  ?Not in distress.  Reports clumsiness.  She seems to have spilled food all over her body. ?Complains of itching and will ask for Benadryl.  Refused to take a shower yesterday. ?Neurosurgery follow-up appreciated.  I agree with neurosurgery that patient understands clearly the risk and benefit of surgery and made a conscious decision to refuse surgery. ? ?Principal Problem: ?  Cervical spondylosis with myelopathy ?Active Problems: ?  Skin  infection ?  Schizoaffective disorder ([place]) ?  Tobacco abuse ?  Debility ?  Arthritis ?  ?Assessment and Plan: ?Cervical spondylitic myelopathy with severe stenosis  ?-MRI cervical spine showed severe stenosis at C3-C4 level due to degenerative anterolisthesis and significant kyphosis.  There is also severe degenerative changes throughout with moderate to severe foraminal narrowing and C4-C8.  There is cord signal change at C3-C4 reaching near the C2-C3 disc space. ?-Neurosurgery was consulted.  Patient was recommended surgical intervention given her progressive myelopathic symptoms.  Patient understands the severity of symptoms.  She states he has rheumatoid arthritis and was warned in the past that she may get ultimately paralyzed.  She does not want surgical intervention at all even if this leads to her death. ?-1 dose of IV Decadron was given in the ED.  Currently on Neurontin 100 mg 3 times daily and hydrocodone as needed. ? ?Skin infection ?-Patient has a skin infection that she describes as itchy in nature of her abdomen and upper leg.  Low ESR and CRP.  ?-On empiric IV Rocephin and clobetasol cream.  ?-Continue to monitor ?-Patient is complaining of itching and is asking for Benadryl.  Suggested to shower.  He refused to do yesterday. ?  ?Schizoaffective disorder  ?-Continue olanzapine ?-Psychiatry consulted ?  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 ?-Check rheumatoid factor ?  ?Tobacco abuse ?-Patient smokes a pack of cigarettes per day on average. ?-Nicotine patch offered ?-Continue to encourage cessation of tobacco use ? ?Insomnia ?-Xanax as needed ? ?Constipation ?-Last BM  a week ago.  Started on scheduled senna and as needed Dulcolax.. ? ? ?Goals of care ?  Code Status: Full Code  ? ? ?Mobility: Limited mobility.  PT eval ? ?Nutritional status:  ?Body mass index is 26.14 kg/m?.  ?  ?  ? ? ? ? ?Diet:  ?Diet Order   ? ?        ?  DIET DYS 3 Room service appropriate? Yes; Fluid consistency: Thin  Diet effective now       ?  ? ?  ?  ? ?  ? ? ?DVT prophylaxis:  ?enoxaparin (LOVENOX) injection 40 mg Start: 07/22/21 1200 ?  ?Antimicrobials: IV Rocephin ?Fluid: None ?Consultants: Neurosurgery, psychiatry ?Family Communication: None at bedside ? ?Status is: Inpatient ? ?Continue in-hospital care because: Pending psychiatry neurosurgery final plan ?Level of care: Med-Surg  ? ?Dispo: The patient is from: Homeless ?             Anticipated d/c is to: Unclear at this time ?             Patient currently is not medically stable to d/c. ?  Difficult to place patient No ? ? ? ? ?Infusions:  ? cefTRIAXone (ROCEPHIN)  IV 1 g (07/23/21 1432)  ? ? ?Scheduled Meds: ? clobetasol ointment   Topical BID  ? diclofenac Sodium  2 g Topical QID  ? enoxaparin (LOVENOX) injection  40 mg Subcutaneous Q24H  ? gabapentin  100 mg Oral TID  ? leptospermum manuka honey  1 application. Topical Daily  ? melatonin  3 mg Oral q1800  ? nicotine  21 mg Transdermal Daily  ? OLANZapine  10 mg Oral QHS  ? polyethylene glycol  17 g Oral Once  ? saccharomyces boulardii  250 mg Oral BID  ? senna-docusate  1 tablet Oral QHS  ? sodium chloride flush  3 mL Intravenous Q12H  ? ? ?PRN meds: ?acetaminophen **OR** acetaminophen, albuterol, ALPRAZolam, bisacodyl, HYDROcodone-acetaminophen, ondansetron **OR** ondansetron (ZOFRAN) IV  ? ?Antimicrobials: ?Anti-infectives (From admission, onward)  ? ? Start     Dose/Rate Route Frequency Ordered Stop  ? 07/22/21 1500  cefTRIAXone (ROCEPHIN) 1 g in sodium chloride 0.9 % 100 mL IVPB       ? 1 g ?200 mL/hr over 30 Minutes Intravenous Every 24 hours 07/22/21 1403 07/29/21 1459  ? ?  ? ? ?Objective: ?Vitals:  ? 07/24/21 0627 07/24/21 0727  ?BP: (!) 151/78 135/76  ?Pulse: 72 72  ?Resp: 18 18  ?Temp: 98 ?F (36.7 ?C) 97.7 ?F (36.5 ?C)  ?SpO2: 95% 92%  ? ? ?Intake/Output Summary (Last 24 hours) at 07/24/2021 1419 ?Last data filed at 07/24/2021 0900 ?Gross  per 24 hour  ?Intake 840 ml  ?Output 1400 ml  ?Net -560 ml  ? ?Filed Weights  ? 07/22/21 1309  ?Weight: 62.4 kg  ? ?Weight change:  ?Body mass index is 26.14 kg/m?.  ? ?Physical Exam: ?General exam: Pleasant elderly Caucasian female ?Skin: Skin lesion as described above. ?HEENT: Atraumatic, normocephalic, no obvious bleeding ?Lungs: Clear to auscultation bilaterally ?CVS: Regular rate and rhythm, no murmur ?GI/Abd soft, nontender, nondistended, bowel sound present ?CNS: Alert, awake, oriented x3 ?Psychiatry: Sad affect ?Extremities: No pedal edema, no calf tenderness, ? ?Data Review: I have personally reviewed the laboratory data and studies available. ? ?F/u labs ordered ?Unresulted Labs (From admission, onward)  ? ?  Start     Ordered  ? 07/22/21 0358  Urinalysis, Routine w reflex microscopic  Once,  STAT       ? 07/22/21 0358  ? 07/22/21 0358  Urine rapid drug screen (hosp performed)  ONCE - STAT,   STAT       ? 07/22/21 0358  ? ?  ?  ? ?  ? ? ?Signed, ?Terrilee Croak, MD ?[facility] ?07/24/2021 ? ? ? ? ? ? ? ? ? ? ? ? ?

## 2021-07-24 NOTE — Progress Notes (Signed)
I had an extensive discussion again with the patient about surgical intervention.  She remains knowledgeable, and aware about her cervical stenosis and condition.  She can speak intelligently about her symptoms and findings on her imaging.  Therefore I do believe she has understanding of her pathology, and she is adamant about no surgical intervention whatsoever.  She states she never wanted surgery nor would want any surgery on her neck in the future.  She understands that this is why she has diffuse weakness.  She understands that with surgery this likely would not get better, and would only be preventative for further weakness and paralysis. ? ?I do not believe any surgical intervention is warranted given her adamant refusal despite psychiatry's evaluation of possible lacking of capacity to deny surgery.  This is not an emergent surgical intervention therefore I do not believe forcing her into surgery is appropriate. ? ?Recommend conservative care with pain control, physical therapy evaluation and treatment. ? ? ?Thank you for allowing me to participate in this patient's care.  Please do not hesitate to call with questions or concerns. ? ? ?Ali Mclaurin, DO ?Neurosurgeon ?[facility] ?Cell: [phone] ? ?

## 2021-07-24 NOTE — Plan of Care (Signed)
  Problem: Coping: Goal: Level of anxiety will decrease Outcome: Not Progressing   Problem: Pain Managment: Goal: General experience of comfort will improve Outcome: Not Progressing   
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
## 2021-07-24 NOTE — Evaluation (Signed)
Physical Therapy Evaluation ?Patient Details ?Name: Angela Medina ?MRN: [id number] ?DOB: Sep 26, 1950 ?Today's Date: 07/24/2021 ? ?History of Present Illness ? Pt is a 71 y.o. F who presents 07/21/2021 with progressively worsening weakness, numbness, tingling in arms and legs as well as inability to walk for months. MRI cervical spine showed severe stenosis at C3-C4 level due to degenerative anterolisthesis and significant kyphosis.  There is also severe degenerative changes throughout with moderate to severe foraminal narrowing and C4-C8.  There is cord signal change at C3-C4 reaching near the C2-C3 disc space. Neurosurgery recommending surgery and pt declining. Significant PMH: RA, schizoaffective disorder, depression.  ?Clinical Impression ? PTA, pt is homeless, is non ambulatory, and has difficulty with self feeding. Pt with multiple complaints upon entrance and immediately states, "I cannot work with physical therapy." PT provided supportive listening and addressed pt complaints with RN with her medications and their side effects. Pt requiring moderate assist for rolling and repositioning in upright position for feeding/drinking. Pt able to bring utensil to mouth with min assist; may benefit from built up utensils (OT eval ordered). Pt presents with generalized weakness, decreased ROM, sensation changes, and impaired balance. Discussed with pt practicing w/c transfers in future sessions and she is agreeable.  ?   ? ?Recommendations for follow up therapy are one component of a multi-disciplinary discharge planning process, led by the attending physician.  Recommendations may be updated based on patient status, additional functional criteria and insurance authorization. ? ?Follow Up Recommendations Skilled nursing-short term rehab (<3 hours/day) ? ?  ?Assistance Recommended at Discharge Frequent or constant Supervision/Assistance  ?Patient can return home with the following ? A lot of help with walking and/or  transfers;A lot of help with bathing/dressing/bathroom ? ?  ?Equipment Recommendations Other (comment) (TBA)  ?Recommendations for Other Services ?    ?  ?Functional Status Assessment Patient has had a recent decline in their functional status and demonstrates the ability to make significant improvements in function in a reasonable and predictable amount of time.  ? ?  ?Precautions / Restrictions Precautions ?Precautions: Fall ?Restrictions ?Weight Bearing Restrictions: No  ? ?  ? ?Mobility ? Bed Mobility ?Overal bed mobility: Needs Assistance ?Bed Mobility: Rolling ?Rolling: Mod assist ?  ?  ?  ?  ?General bed mobility comments: Rolling to R with modA ?  ? ?Transfers ?  ?  ?  ?  ?  ?  ?  ?  ?  ?  ?  ? ?Ambulation/Gait ?  ?  ?  ?  ?  ?  ?  ?  ? ?Stairs ?  ?  ?  ?  ?  ? ?Wheelchair Mobility ?  ? ?Modified Rankin (Stroke Patients Only) ?  ? ?  ? ?Balance   ?  ?  ?  ?  ?  ?  ?  ?  ?  ?  ?  ?  ?  ?  ?  ?  ?  ?  ?   ? ? ? ?Pertinent Vitals/Pain Pain Assessment ?Pain Assessment: Faces ?Faces Pain Scale: Hurts little more ?Pain Location: generalized ?Pain Descriptors / Indicators: Discomfort ?Pain Intervention(s): Monitored during session  ? ? ?Home Living Family/patient expects to be discharged to:: Shelter/Homeless ?  ?  ?  ?  ?  ?  ?  ?  ?  ?   ?  ?Prior Function Prior Level of Function : Patient poor historian/Family not available ?  ?  ?  ?  ?  ?  ?  Mobility Comments: pt reports requires heavy assist for w/c transfers, unable to propel w/c ?ADLs Comments: pt reports has not been washing up recently ?  ? ? ?Hand Dominance  ?   ? ?  ?Extremity/Trunk Assessment  ? Upper Extremity Assessment ?Upper Extremity Assessment: Defer to OT evaluation ?  ? ?Lower Extremity Assessment ?Lower Extremity Assessment: Generalized weakness;RLE deficits/detail;LLE deficits/detail ?RLE Deficits / Details: Grossly 2-/5. Ankle dorsiflexion ROM to neutral ?LLE Deficits / Details: Grossly 1+/5 throughout, pt reports numbness. Ankle dorsiflexion  ROM to neutral ?  ? ?Cervical / Trunk Assessment ?Cervical / Trunk Assessment: Kyphotic  ?Communication  ? Communication: Other (comment) (mumbles)  ?Cognition Arousal/Alertness: Awake/alert ?Behavior During Therapy: [facility] for tasks assessed/performed ?Overall Cognitive Status: History of cognitive impairments - at baseline ?  ?  ?  ?  ?  ?  ?  ?  ?  ?  ?  ?  ?  ?  ?  ?  ?General Comments: Psych hx. Pt with various complaints, difficult to redirect ?  ?  ? ?  ?General Comments   ? ?  ?Exercises    ? ?Assessment/Plan  ?  ?PT Assessment Patient needs continued PT services  ?PT Problem List Decreased strength;Decreased activity tolerance;Decreased balance;Decreased mobility;Decreased cognition;Decreased safety awareness ? ?   ?  ?PT Treatment Interventions Functional mobility training;Therapeutic activities;Therapeutic exercise;Balance training;Patient/family education;Wheelchair mobility training   ? ?PT Goals (Current goals can be found in the Care Plan section)  ?Acute Rehab PT Goals ?Patient Stated Goal: to eat ?PT Goal Formulation: With patient ?Time For Goal Achievement: 08/07/21 ?Potential to Achieve Goals: Fair ? ?  ?Frequency Min 2X/week ?  ? ? ?Co-evaluation   ?  ?  ?  ?  ? ? ?  ?AM-PAC PT "6 Clicks" Mobility  ?Outcome Measure Help needed turning from your back to your side while in a flat bed without using bedrails?: A Lot ?Help needed moving from lying on your back to sitting on the side of a flat bed without using bedrails?: Total ?Help needed moving to and from a bed to a chair (including a wheelchair)?: Total ?Help needed standing up from a chair using your arms (e.g., wheelchair or bedside chair)?: Total ?Help needed to walk in hospital room?: Total ?Help needed climbing 3-5 steps with a railing? : Total ?6 Click Score: 7 ? ?  ?End of Session   ?Activity Tolerance: Other (comment) (limited due to behaviors) ?Patient left: in bed;with call bell/phone within reach ?Nurse Communication: Mobility status ?PT  Visit Diagnosis: Other abnormalities of gait and mobility (R26.89);Muscle weakness (generalized) (M62.81) ?  ? ?Time: [id number] ?PT Time Calculation (min) (ACUTE ONLY): 32 min ? ? ?Charges:   PT Evaluation ?$PT Eval Moderate Complexity: 1 Mod ?PT Treatments ?$Therapeutic Activity: 8-22 mins ?  ?   ? ? ?Wyona Almas, PT, DPT ?Acute Rehabilitation Services ?Pager [phone] ?Office [phone] ? ? ?Deno Etienne ?07/24/2021, 2:43 PM ? ?

## 2021-07-25 DIAGNOSIS — M4712 Other spondylosis with myelopathy, cervical region: Secondary | ICD-10-CM | POA: Diagnosis not present

## 2021-07-25 LAB — HEMOGLOBIN A1C
Hgb A1c MFr Bld: 5.7 % — ABNORMAL HIGH (ref 4.8–5.6)
Mean Plasma Glucose: 116.89 mg/dL

## 2021-07-25 LAB — LIPID PANEL
Cholesterol: 149 mg/dL (ref 0–200)
HDL: 37 mg/dL — ABNORMAL LOW (ref >40)
LDL Cholesterol: 75 mg/dL (ref 0–99)
Total CHOL/HDL Ratio: 4 RATIO
Triglycerides: 183 mg/dL — ABNORMAL HIGH (ref <150)
VLDL: 37 mg/dL (ref 0–40)

## 2021-07-25 MED ORDER — BISACODYL 10 MG RE SUPP
10.0000 mg | Freq: Every day | RECTAL | Status: DC | PRN
  Administered 2021-07-25: 10 mg via RECTAL
  Filled 2021-07-25: qty 1

## 2021-07-25 MED ORDER — MAGNESIUM HYDROXIDE 400 MG/5ML PO SUSP
30.0000 mL | Freq: Two times a day (BID) | ORAL | Status: AC | PRN
  Administered 2021-07-25 – 2021-07-26 (×2): 30 mL via ORAL
  Filled 2021-07-25 (×3): qty 30
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
## 2021-07-25 NOTE — Evaluation (Signed)
Occupational Therapy Evaluation ?Patient Details ?Name: Angela Medina ?MRN: [id number] ?DOB: 1950-10-27 ?Today's Date: 07/25/2021 ? ? ?History of Present Illness Pt is a 71 y.o. F who presents 07/21/2021 with progressively worsening weakness, numbness, tingling in arms and legs as well as inability to walk for months. MRI cervical spine showed severe stenosis at C3-C4 level due to degenerative anterolisthesis and significant kyphosis.  There is also severe degenerative changes throughout with moderate to severe foraminal narrowing and C4-C8.  There is cord signal change at C3-C4 reaching near the C2-C3 disc space. Neurosurgery recommending surgery and pt declining. Significant PMH: RA, schizoaffective disorder, depression.  ? ?Clinical Impression ?  ?Pt reports she uses a w/c at baseline for mobility, states when she needs to transfer she requires assistance from people on the street as she is currently homeless. Pt states she has not been participating in ADLs, and reports "washing up" only when she is in the hospital. Pt presenting with decreased UE ROM and grasp strength. Provided pt with built up utensils, able to simulate self-feeding with supervision. Pt presenting with impairments listed below, will follow acutely. Recommend SNF at d/c. ?   ? ?Recommendations for follow up therapy are one component of a multi-disciplinary discharge planning process, led by the attending physician.  Recommendations may be updated based on patient status, additional functional criteria and insurance authorization.  ? ?Follow Up Recommendations ? Skilled nursing-short term rehab (<3 hours/day)  ?  ?Assistance Recommended at Discharge Intermittent Supervision/Assistance  ?Patient can return home with the following Two people to help with walking and/or transfers;Two people to help with bathing/dressing/bathroom;Assistance with cooking/housework;Direct supervision/assist for medications management;Direct supervision/assist for  financial management;Assist for transportation;Help with stairs or ramp for entrance ? ?  ?Functional Status Assessment ? Patient has had a recent decline in their functional status and demonstrates the ability to make significant improvements in function in a reasonable and predictable amount of time.  ?Equipment Recommendations ? None recommended by OT;Other (comment) (defer to next venue of care)  ?  ?Recommendations for Other Services   ? ? ?  ?Precautions / Restrictions Precautions ?Precautions: Fall ?Restrictions ?Weight Bearing Restrictions: No  ? ?  ? ?Mobility Bed Mobility ?  ?  ?  ?  ?  ?  ?  ?General bed mobility comments: pt refusing due to pain ?  ? ?Transfers ?  ?  ?  ?  ?  ?  ?  ?  ?  ?General transfer comment: pt refusing due to pain ?  ? ?  ?Balance   ?  ?  ?  ?  ?  ?  ?  ?  ?  ?  ?  ?  ?  ?  ?  ?  ?  ?  ?   ? ?ADL either performed or assessed with clinical judgement  ? ?ADL Overall ADL's : Needs assistance/impaired ?Eating/Feeding: With adaptive utensils;Bed level ?Eating/Feeding Details (indicate cue type and reason): simulated with built up utensils, can also eat with hands ?Grooming: Minimal assistance;Bed level ?  ?Upper Body Bathing: Moderate assistance;Bed level ?  ?Lower Body Bathing: Maximal assistance;Bed level ?  ?Upper Body Dressing : Moderate assistance;Bed level ?  ?Lower Body Dressing: Maximal assistance;Bed level ?  ?Toilet Transfer: Maximal assistance;+2 for safety/equipment;BSC/3in1;Stand-pivot ?  ?Toileting- Clothing Manipulation and Hygiene: Maximal assistance ?  ?  ?  ?Functional mobility during ADLs: Maximal assistance;+2 for safety/equipment ?   ? ? ? ?Vision   ?Vision Assessment?: No apparent visual deficits ?Additional  Comments: closes eyes frequently during session  ?   ?Perception   ?  ?Praxis   ?  ? ?Pertinent Vitals/Pain Pain Assessment ?Pain Assessment: Faces ?Pain Score: 8  ?Faces Pain Scale: Hurts whole lot ?Pain Location: LLE, R shoulder, and down R side of  body ?Pain Descriptors / Indicators: Discomfort ?Pain Intervention(s): Limited activity within patient's tolerance, Monitored during session  ? ? ? ?Hand Dominance   ?  ?Extremity/Trunk Assessment Upper Extremity Assessment ?Upper Extremity Assessment: RUE deficits/detail;LUE deficits/detail ?RUE Deficits / Details: ~90 degrees shoulder flexion ROM, 2/5 grasp strength ?LUE Deficits / Details: ~30 degrees shoulder flexion ROM, 2/5 grasp strength ?  ?Lower Extremity Assessment ?Lower Extremity Assessment: Defer to PT evaluation ?  ?Cervical / Trunk Assessment ?Cervical / Trunk Assessment: Kyphotic ?  ?Communication Communication ?Communication: No difficulties ?  ?Cognition Arousal/Alertness: Awake/alert ?Behavior During Therapy: [facility] for tasks assessed/performed ?Overall Cognitive Status: History of cognitive impairments - at baseline ?  ?  ?  ?  ?  ?  ?  ?  ?  ?  ?  ?  ?  ?  ?  ?  ?General Comments: Psych hx. Pt a & o x4 ?  ?  ?General Comments  pt preoccupied with TV not working during session, limited with mobility she wants to perform and questions to answer ? ?  ?Exercises   ?  ?Shoulder Instructions    ? ? ?Home Living Family/patient expects to be discharged to:: Shelter/Homeless ?  ?  ?  ?  ?  ?  ?  ?  ?  ?  ?  ?  ?  ?  ?  ?  ?Additional Comments: Eyes closed during questions ?  ? ?  ?Prior Functioning/Environment Prior Level of Function : Needs assist ?  ?  ?  ?  ?  ?  ?Mobility Comments: reports having w/c but it is too small, pt does not fit in it, was asking other people on the street to assist wtih transfers ?ADLs Comments: reports only getting "washed up" during hospital visits ?  ? ?  ?  ?OT Problem List: Decreased strength;Decreased range of motion;Pain;Impaired UE functional use;Decreased coordination ?  ?   ?OT Treatment/Interventions: Self-care/ADL training;Therapeutic exercise;Therapeutic activities;DME and/or AE instruction;Patient/family education;Balance training  ?  ?OT Goals(Current goals can  be found in the care plan section) Acute Rehab OT Goals ?Patient Stated Goal: none stated ?OT Goal Formulation: With patient ?Time For Goal Achievement: 08/08/21 ?Potential to Achieve Goals: Fair  ?OT Frequency: Min 2X/week ?  ? ?Co-evaluation   ?  ?  ?  ?  ? ?  ?AM-PAC OT "6 Clicks" Daily Activity     ?Outcome Measure Help from another person eating meals?: A Little ?Help from another person taking care of personal grooming?: A Little ?Help from another person toileting, which includes using toliet, bedpan, or urinal?: Total ?Help from another person bathing (including washing, rinsing, drying)?: A Lot ?Help from another person to put on and taking off regular upper body clothing?: A Lot ?Help from another person to put on and taking off regular lower body clothing?: Total ?6 Click Score: 12 ?  ?End of Session Nurse Communication: Mobility status ? ?Activity Tolerance: Patient limited by pain;Treatment limited secondary to agitation ?Patient left: in bed;with call bell/phone within reach;with bed alarm set ? ?OT Visit Diagnosis: Muscle weakness (generalized) (M62.81)  ?              ?Time: 9629-5284 ?OT Time Calculation (min):  18 min ?Charges:  OT General Charges ?$OT Visit: 1 Visit ?OT Evaluation ?$OT Eval Moderate Complexity: 1 Mod ? ?Alfonzo BeersNatalie Danny Zimny, OTD, OTR/L ?Acute Rehab ?[phone] ? ?Mayer MaskerNatalie M Varnell Orvis ?07/25/2021, 2:15 PM ?
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
## 2021-07-25 NOTE — Progress Notes (Signed)
? ?  RE:  Angela Medina       ?Date of Birth:  01/27/1951     ?Date:  07/25/21      ? ? ?To Whom It May Concern: ? ?Please be advised that the above-named patient will require a short-term nursing home stay - anticipated 30 days or less for rehabilitation and strengthening.  The plan is for return home. ? ? ?              ?MD signature ? ?              ?Date ?

## 2021-07-25 NOTE — Progress Notes (Signed)
Patient has been calling the entire night constantly requesting drinks, snacks, wanting staff to change the channel to the tv.  Making all kinds of requests. ? ?She uses fowl language when staff is exiting the room. ?

## 2021-07-25 NOTE — Progress Notes (Signed)
Attemped to give patient Angela Medina as ordered and she refused/  Patient very agitated at this time stating that she and the doctor discussed him giving her Xanax and Ambien.  Dr. Pola Corn informed.  ?

## 2021-07-25 NOTE — [place] FL2 (Signed)
?[place] MEDICAID FL2 LEVEL OF CARE SCREENING TOOL  ?  ? ?IDENTIFICATION  ?Patient Name: ?Angela CombesRebecca S Medina Birthdate: 12/30/1950 Sex: female Admission Date (Current Location): ?07/21/2021  ?[place] ?  Facility and Address:  ?[facility], [address], [place], [place] [address] ?     Provider Number: ?[id number]  ?Attending Physician Name and Address:  ?Lorin Glassahal, Binaya, MD ? Relative Name and Phone Number:  ?Laurel DimmerFrye,Angela Sister   [phone] ?   ?Current Level of Care: ?Hospital Recommended Level of Care: ?Skilled Nursing Facility Prior Approval Number: ?  ? ?Date Approved/Denied: ?  PASRR Number: ?  ? ?Discharge Plan: ?SNF ?  ? ?Current Diagnoses: ?Patient Active Problem List  ? Diagnosis Date Noted  ? Cervical spondylosis with myelopathy 07/22/2021  ? Tobacco abuse 07/22/2021  ? Debility 07/22/2021  ? Arthritis 07/22/2021  ? Skin infection 05/04/2021  ? Homelessness unspecified 04/11/2021  ? Community acquired pneumonia 04/03/2021  ? Schizoaffective disorder (HCC) 04/02/2021  ? 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 Acute hypoxemic respiratory failure (HCC) 04/02/2021  ? Bronchopneumonia 04/01/2021  ? ? ?Orientation RESPIRATION BLADDER Height & Weight   ?  ?Self, Time, Situation, Place ? Normal Incontinent, External catheter Weight: 137 lb 9.1 oz (62.4 kg) ?Height:  5' 0.83" (154.5 cm)  ?BEHAVIORAL SYMPTOMS/MOOD NEUROLOGICAL BOWEL NUTRITION STATUS  ?    Incontinent Diet (see discharge summary)  ?AMBULATORY STATUS COMMUNICATION OF NEEDS Skin   ?Total Care Verbally Normal ?  ?  ?  ?    ?     ?     ? ? ?Personal Care Assistance Level of Assistance  ?Bathing, Feeding, Dressing Bathing Assistance: Maximum assistance ?Feeding assistance: Limited assistance ?Dressing Assistance: Maximum assistance ?   ? ?Functional Limitations Info  ?Sight, Hearing, Speech Sight Info: Adequate ?Hearing Info: Adequate ?Speech Info: Adequate  ? ? ?SPECIAL CARE FACTORS FREQUENCY  ?PT (By  licensed PT), OT (By licensed OT)   ?  ?PT Frequency: 5x week ?OT Frequency: 5x week ?  ?  ?  ?   ? ? ?Contractures Contractures Info: Not present  ? ? ?Additional Factors Info  ?Code Status, Allergies Code Status Info: full ?Allergies Info: Methotrexate Derivatives, Penicillins ?  ?  ?  ?   ? ?Current Medications (07/25/2021):  This is the current hospital active medication list ?Current Facility-Administered Medications  ?Medication Dose Route Frequency Provider Last Rate Last Admin  ? acetaminophen (TYLENOL) tablet 650 mg  650 mg Oral Q6H PRN Madelyn FlavorsSmith, Rondell A, MD   650 mg at 07/25/21 0008  ? Or  ? acetaminophen (TYLENOL) suppository 650 mg  650 mg Rectal Q6H PRN Madelyn FlavorsSmith, Rondell A, MD      ? albuterol (PROVENTIL) (2.5 MG/3ML) 0.083% nebulizer solution 2.5 mg  2.5 mg Nebulization Q6H PRN Madelyn FlavorsSmith, Rondell A, MD      ? ALPRAZolam Prudy Feeler(XANAX) tablet 0.25 mg  0.25 mg Oral QHS PRN Lorin Glassahal, Binaya, MD   0.25 mg at 07/24/21 2224  ? bisacodyl (DULCOLAX) EC tablet 10 mg  10 mg Oral Daily PRN Dahal, Melina SchoolsBinaya, MD      ? cefTRIAXone (ROCEPHIN) 1 g in sodium chloride 0.9 % 100 mL IVPB  1 g Intravenous Q24H Smith, Rondell A, MD 200 mL/hr at 07/24/21 1706 1 g at 07/24/21 1706  ? clobetasol ointment (TEMOVATE) 0.05 %   Topical BID Clydie BraunSmith, Rondell A, MD   Given at 07/25/21 [phone]  ? diclofenac Sodium (VOLTAREN) 1 % topical gel  2 g  2 g Topical QID Madelyn Flavors A, MD   2 g at 07/25/21 9147  ? enoxaparin (LOVENOX) injection 40 mg  40 mg Subcutaneous Q24H Smith, Rondell A, MD   40 mg at 07/23/21 1400  ? gabapentin (NEURONTIN) capsule 100 mg  100 mg Oral TID Madelyn Flavors A, MD   100 mg at 07/25/21 0956  ? HYDROcodone-acetaminophen (NORCO/VICODIN) 5-325 MG per tablet 1 tablet  1 tablet Oral Q6H PRN Clydie Braun, MD   1 tablet at 07/25/21 0650  ? leptospermum manuka honey (MEDIHONEY) paste 1 application.  1 application. Topical Daily Dahal, Melina Schools, MD   1 application. at 07/25/21 1000  ? melatonin tablet 3 mg  3 mg Oral q1800 Cinderella,  Margaret A   3 mg at 07/23/21 8295  ? nicotine (NICODERM CQ - dosed in mg/24 hours) patch 21 mg  21 mg Transdermal Daily Madelyn Flavors A, MD   21 mg at 07/25/21 0957  ? OLANZapine (ZYPREXA) tablet 10 mg  10 mg Oral QHS Madelyn Flavors A, MD   10 mg at 07/24/21 2224  ? ondansetron (ZOFRAN) tablet 4 mg  4 mg Oral Q6H PRN Madelyn Flavors A, MD      ? Or  ? ondansetron (ZOFRAN) injection 4 mg  4 mg Intravenous Q6H PRN Smith, Rondell A, MD      ? polyethylene glycol (MIRALAX / GLYCOLAX) packet 17 g  17 g Oral Once Luiz Iron, NP      ? saccharomyces boulardii (FLORASTOR) capsule 250 mg  250 mg Oral BID Madelyn Flavors A, MD   250 mg at 07/25/21 0956  ? senna-docusate (Senokot-S) tablet 1 tablet  1 tablet Oral QHS Lorin Glass, MD   1 tablet at 07/24/21 2224  ? sodium chloride flush (NS) 0.9 % injection 3 mL  3 mL Intravenous Q12H Smith, Rondell A, MD   3 mL at 07/25/21 0959  ? ? ? ?Discharge Medications: ?Please see discharge summary for a list of discharge medications. ? ?Relevant Imaging Results: ? ?Relevant Lab Results: ? ? ?Additional Information ?SSN: [id number]. Pt is not vaccinated for covid. ? ?Lorri Frederick, LCSW ? ? ? ? ?

## 2021-07-25 NOTE — Progress Notes (Signed)
?PROGRESS NOTE ? ?Angela Medina  ?DOB: 1950/11/02  ?PCP: Patient, No Pcp Per (Inactive) ?[id number]  ?DOA: 07/21/2021 ? LOS: 3 days  ?Hospital Day: 5 ? ?Brief narrative: ?Angela Medina is a 71 y.o. female with PMH significant for homelessness, rheumatoid arthritis, schizoaffective disorder, depression, chronic impairment of mobility, smokes a pack of cigarette every day. ?She presented to the ED on 07/21/2021 with progressively worsening weakness, numbness, tingling in her arms and legs as well as inability to walk for months.  She also complains of shooting lightening sensation throughout her whole body with certain movements. ?She has numbness and tingling arms and is unable to raise her arms above a certain point.  Patient reports decreased ability to grabs things with her hands for which she almost at the point of being unable to do anything for herself.   ? ?In the ED, patient was afebrile, hemodynamically stable ?Labs unremarkable ?X-rays of the hip and pelvis did not show any acute abnormality.   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   ?Dr. Reatha Armour of neurosurgery was consulted.   ?Patient was given Decadron 10 mg IV.   ?Admitted to hospitalist service.  ? ?Subjective: ?Patient was seen and examined this morning.  Lying on bed.  ?Complains of not having a bowel movement for last several days.  ?Pending placement ? ? ?Principal Problem: ?  Cervical spondylosis with myelopathy ?Active Problems: ?  Skin infection ?  Schizoaffective disorder ([place]) ?  Tobacco abuse ?  Debility ?  Arthritis ?  ?Assessment and Plan: ?Cervical spondylitic myelopathy with severe stenosis  ?-MRI cervical spine showed severe stenosis at C3-C4 level due to degenerative anterolisthesis and  significant kyphosis.  There is also severe degenerative changes throughout with moderate to severe foraminal narrowing and C4-C8.  There is cord signal change at C3-C4 reaching near the C2-C3 disc space. ?-Neurosurgery was consulted.  Patient was recommended surgical intervention given her progressive myelopathic symptoms.  Patient understands the severity of symptoms.  She states he has rheumatoid arthritis and was warned in the past that she may get ultimately paralyzed.  She does not want surgical intervention at all even if this leads to her death. ?-1 dose of IV Decadron was given in the ED.  Currently on Neurontin 100 mg 3 times daily and hydrocodone as needed. ? ?Skin infection ?-Patient has a skin infection that she describes as itchy in nature of her abdomen and upper leg.  Low ESR and CRP.  ?-On empiric IV Rocephin and clobetasol cream.  ?-Continue to monitor ?-Patient is complaining of itching and is asking for Benadryl.  Suggested again to shower today. ?  ?Schizoaffective disorder  ?-Continue olanzapine ?-Psychiatry consulted ?  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 ?  ?Tobacco abuse ?-Patient smokes a pack of cigarettes per day on average. ?-Nicotine patch offered ?-Continue to encourage cessation of tobacco use ? ?Insomnia ?-Xanax as needed ? ?Constipation ?-Last BM a week ago.  Started on scheduled senna and as needed Dulcolax.  Give 1 dose of enema today. ? ?Goals of care ?  Code Status: Full Code  ? ? ?Mobility: Limited mobility.  PT eval ? ?Nutritional status:  ?Body mass index is 26.14 kg/m?.  ?  ?  ? ? ? ? ?  Diet:  ?Diet Order   ? ?       ?  DIET DYS 3 Room service appropriate? No; Fluid consistency: Thin  Diet effective now       ?  ? ?  ?  ? ?  ? ? ?DVT prophylaxis:  ?enoxaparin (LOVENOX) injection 40 mg Start: 07/22/21 1200 ?  ?Antimicrobials: Can stop IV Rocephin tomorrow. ?Fluid: None ?Consultants:  Neurosurgery, psychiatry ?Family Communication: None at bedside ? ?Status is: Inpatient ? ?Continue in-hospital care because: Pending placement ?Level of care: Med-Surg  ? ?Dispo: The patient is from: Homeless ?             Anticipated d/c is to: Pending SNF  ?             Patient currently is not medically stable to d/c. ?Difficult to place patient No ? ? ?Infusions:  ? cefTRIAXone (ROCEPHIN)  IV 1 g (07/24/21 1706)  ? ? ?Scheduled Meds: ? clobetasol ointment   Topical BID  ? diclofenac Sodium  2 g Topical QID  ? enoxaparin (LOVENOX) injection  40 mg Subcutaneous Q24H  ? gabapentin  100 mg Oral TID  ? leptospermum manuka honey  1 application. Topical Daily  ? melatonin  3 mg Oral q1800  ? nicotine  21 mg Transdermal Daily  ? OLANZapine  10 mg Oral QHS  ? polyethylene glycol  17 g Oral Once  ? saccharomyces boulardii  250 mg Oral BID  ? senna-docusate  1 tablet Oral QHS  ? sodium chloride flush  3 mL Intravenous Q12H  ? ? ?PRN meds: ?acetaminophen **OR** acetaminophen, albuterol, ALPRAZolam, bisacodyl, HYDROcodone-acetaminophen, ondansetron **OR** ondansetron (ZOFRAN) IV  ? ?Antimicrobials: ?Anti-infectives (From admission, onward)  ? ? Start     Dose/Rate Route Frequency Ordered Stop  ? 07/22/21 1500  cefTRIAXone (ROCEPHIN) 1 g in sodium chloride 0.9 % 100 mL IVPB       ? 1 g ?200 mL/hr over 30 Minutes Intravenous Every 24 hours 07/22/21 1403 07/29/21 1459  ? ?  ? ? ?Objective: ?Vitals:  ? 07/24/21 2213 07/25/21 0807  ?BP: (!) 141/73   ?Pulse: 66   ?Resp: 17 16  ?Temp: 98.9 ?F (37.2 ?C) 98.7 ?F (37.1 ?C)  ?SpO2: 93%   ? ? ?Intake/Output Summary (Last 24 hours) at 07/25/2021 1421 ?Last data filed at 07/25/2021 0600 ?Gross per 24 hour  ?Intake 480 ml  ?Output 1700 ml  ?Net -1220 ml  ? ?Filed Weights  ? 07/22/21 1309  ?Weight: 62.4 kg  ? ?Weight change:  ?Body mass index is 26.14 kg/m?.  ? ?Physical Exam: ?General exam: Pleasant elderly Caucasian female ?Skin: Skin lesion as described above. ?HEENT: Atraumatic,  normocephalic, no obvious bleeding ?Lungs: Clear to auscultation bilaterally ?CVS: Regular rate and rhythm, no murmur ?GI/Abd soft, nontender, nondistended, bowel sound present ?CNS: Alert, awake, oriented x3.  Clumsy movement of extremities. ?Psychiatry: Sad affect ?Extremities: No pedal edema, no calf tenderness, ? ?Data Review: I have personally reviewed the laboratory data and studies available. ? ?F/u labs ordered ?Unresulted Labs (From admission, onward)  ? ?  Start     Ordered  ? 07/25/21 1410  Hemoglobin A1c  Add-on,   AD       ? 07/25/21 1409  ? 07/25/21 1410  Lipid panel  Add-on,   AD       ? 07/25/21 1409  ? 07/22/21 0358  Urinalysis, Routine w reflex microscopic  Once,   STAT       ?  07/22/21 0358  ? 07/22/21 0358  Urine rapid drug screen (hosp performed)  ONCE - STAT,   STAT       ? 07/22/21 0358  ? ?  ?  ? ?  ? ? ?Signed, ?Terrilee Croak, MD ?[facility] ?07/25/2021 ? ? ? ? ? ? ? ? ? ? ? ? ?

## 2021-07-26 DIAGNOSIS — M4712 Other spondylosis with myelopathy, cervical region: Secondary | ICD-10-CM | POA: Diagnosis not present

## 2021-07-26 MED ORDER — DICLOFENAC SODIUM 1 % EX GEL
2.0000 g | Freq: Three times a day (TID) | CUTANEOUS | Status: DC | PRN
  Administered 2021-07-30 – 2021-08-03 (×2): 2 g via TOPICAL
  Filled 2021-07-26: qty 100

## 2021-07-26 MED ORDER — SODIUM CHLORIDE 0.9 % IV SOLN
1.0000 g | INTRAVENOUS | Status: AC
  Administered 2021-07-26: 1 g via INTRAVENOUS
  Filled 2021-07-26: qty 10

## 2021-07-26 MED ORDER — HYDROCODONE-ACETAMINOPHEN 5-325 MG PO TABS
1.0000 | ORAL_TABLET | ORAL | Status: DC | PRN
  Administered 2021-07-26 – 2021-08-03 (×28): 1 via ORAL
  Filled 2021-07-26 (×29): qty 1

## 2021-07-26 MED ORDER — ALPRAZOLAM 0.5 MG PO TABS
0.5000 mg | ORAL_TABLET | Freq: Every evening | ORAL | Status: DC | PRN
  Administered 2021-07-26 – 2021-07-30 (×5): 0.5 mg via ORAL
  Filled 2021-07-26 (×6): qty 1

## 2021-07-26 NOTE — Care Management Important Message (Signed)
Important Message ? ?Patient Details  ?Name: Jacqualin CombesRebecca S  ?MRN: [id number] ?Date of Birth: 05/21/1950 ? ? ?Medicare Important Message Given:  Yes ? ? ? ? ?Marylene Landngela  Muskan Bolla-Martin ?07/26/2021, 12:57 PM ?

## 2021-07-26 NOTE — Progress Notes (Signed)
Pt has been informed that her scheduled gabapentin is due, and attempted to give it as ordered. Pt refused, Per pt she doesn't take gabapentin nor melatonin. And pt requested if she could have valium 10mg  instead, Per pt. She takes valium 10mg  as needed. Pt is alert in no apparent distress. Attending MD made aware of above with no new orders at this time. ?

## 2021-07-26 NOTE — TOC Progression Note (Signed)
Transition of Care (TOC) - Progression Note  ? ? ?Patient Details  ?Name: Jacqualin CombesRebecca S  ?MRN: [id number] ?Date of Birth: 10/01/1950 ? ?Transition of Care (TOC) CM/SW Contact  ?Lorri FrederickWierda, Dillan Lunden Jon, LCSW ?Phone Number: ?07/26/2021, 8:36 AM ? ?Clinical Narrative:   additional information sent to  Must.  ? ? ? ?Expected Discharge Plan: Skilled Nursing Facility ?Barriers to Discharge: SNF Pending bed offer, Other (must enter comment) (homeless, mental health needs) ? ?Expected Discharge Plan and Services ?Expected Discharge Plan: Skilled Nursing Facility ?In-house Referral: Clinical Social Work ?  ?Post Acute Care Choice: Skilled Nursing Facility ?Living arrangements for the past 2 months: Homeless ?                ?  ?  ?  ?  ?  ?  ?  ?  ?  ?  ? ? ?Social Determinants of Health (SDOH) Interventions ?  ? ?Readmission Risk Interventions ?   ? View : No data to display.  ?  ?  ?  ? ? ?

## 2021-07-26 NOTE — Plan of Care (Signed)
  Problem: Education: Goal: Knowledge of General Education information will improve Description: Including pain rating scale, medication(s)/side effects and non-pharmacologic comfort measures Outcome: Progressing   Problem: Health Behavior/Discharge Planning: Goal: Ability to manage health-related needs will improve Outcome: Progressing   Problem: Clinical Measurements: Goal: Will remain free from infection Outcome: Progressing   Problem: Activity: Goal: Risk for activity intolerance will decrease Outcome: Progressing   Problem: Nutrition: Goal: Adequate nutrition will be maintained Outcome: Progressing   Problem: Coping: Goal: Level of anxiety will decrease Outcome: Progressing   Problem: Elimination: Goal: Will not experience complications related to bowel motility Outcome: Progressing   Problem: Pain Managment: Goal: General experience of comfort will improve Outcome: Progressing   

## 2021-07-26 NOTE — Progress Notes (Signed)
?PROGRESS NOTE ? ?Angela Medina  ?DOB: 09-07-1950  ?PCP: Patient, No Pcp Per (Inactive) ?[id number]  ?DOA: 07/21/2021 ? LOS: 4 days  ?Hospital Day: 6 ? ?Brief narrative: ?Angela Medina is a 71 y.o. female with PMH significant for homelessness, rheumatoid arthritis, schizoaffective disorder, depression, chronic impairment of mobility, smokes a pack of cigarette every day. ?She presented to the ED on 07/21/2021 with progressively worsening weakness, numbness, tingling in her arms and legs as well as inability to walk for months.  She also complains of shooting lightening sensation throughout her whole body with certain movements. ?She has numbness and tingling arms and is unable to raise her arms above a certain point.  Patient reports decreased ability to grabs things with her hands for which she almost at the point of being unable to do anything for herself.   ? ?In the ED, patient was afebrile, hemodynamically stable ?Labs unremarkable ?X-rays of the hip and pelvis did not show any acute abnormality.   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   ?Dr. Reatha Armour of neurosurgery was consulted.   ?Patient was given Decadron 10 mg IV.   ?Admitted to hospitalist service.  ? ?Subjective: ?Patient was seen and examined this morning.  Lying on bed.  ?Had few small bowel movements yesterday. ?With adequate pain control ? ?Principal Problem: ?  Cervical spondylosis with myelopathy ?Active Problems: ?  Skin infection ?  Schizoaffective disorder ([place]) ?  Tobacco abuse ?  Debility ?  Arthritis ?  ?Assessment and Plan: ?Cervical spondylitic myelopathy with severe stenosis  ?-MRI cervical spine showed severe stenosis at C3-C4 level due to degenerative anterolisthesis and significant kyphosis.   There is also severe degenerative changes throughout with moderate to severe foraminal narrowing and C4-C8.  There is cord signal change at C3-C4 reaching near the C2-C3 disc space. ?-Neurosurgery was consulted.  Patient was recommended surgical intervention given her progressive myelopathic symptoms.  Patient understands the severity of symptoms.  She states he has rheumatoid arthritis and was warned in the past that she may get ultimately paralyzed.  She does not want surgical intervention at all even if this leads to her death. ?-1 dose of IV Decadron was given in the ED.  Currently on Neurontin 100 mg 3 times daily and hydrocodone as needed. ? ?Skin infection ?-Patient has a skin infection that she describes as itchy in nature of her abdomen and upper leg.  Low ESR and CRP.  ?-On empiric IV Rocephin and clobetasol cream.  Complete 5-day course of IV Rocephin today ?  ?Schizoaffective disorder  ?-Continue olanzapine ?-Psychiatry consulted ?  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 ?  ?Tobacco abuse ?-Patient smokes a pack of cigarettes per day on average. ?-Nicotine patch offered ?-Continue to encourage cessation of tobacco use ? ?Insomnia ?-Xanax as needed ? ?Constipation ?-Continue scheduled senna and as needed Dulcolax.  ? ?Goals of care ?  Code Status: Full Code  ? ? ?Mobility: Limited mobility.  PT eval ? ?Nutritional status:  ?Body mass index is 26.14 kg/m?.  ?  ?  ? ? ? ? ?Diet:  ?Diet Order   ? ?       ?  DIET DYS 3 Room service appropriate? No;  Fluid consistency: Thin  Diet effective now       ?  ? ?  ?  ? ?  ? ? ?DVT prophylaxis:  ?enoxaparin (LOVENOX) injection 40 mg Start: 07/22/21 1200 ?  ?Antimicrobials: Can stop IV Rocephin after the dose today. ?Fluid: None ?Consultants: Neurosurgery, psychiatry ?Family Communication: None at bedside ? ?Status is: Inpatient ? ?Continue in-hospital care because: Pending  placement ?Level of care: Med-Surg  ? ?Dispo: The patient is from: Homeless ?             Anticipated d/c is to: Pending SNF  ?             Patient currently is not medically stable to d/c. ?Difficult to place patient No ? ? ?Infusions:  ? cefTRIAXone (ROCEPHIN)  IV    ? ? ?Scheduled Meds: ? clobetasol ointment   Topical BID  ? enoxaparin (LOVENOX) injection  40 mg Subcutaneous Q24H  ? gabapentin  100 mg Oral TID  ? leptospermum manuka honey  1 application. Topical Daily  ? melatonin  3 mg Oral q1800  ? nicotine  21 mg Transdermal Daily  ? OLANZapine  10 mg Oral QHS  ? polyethylene glycol  17 g Oral Once  ? saccharomyces boulardii  250 mg Oral BID  ? senna-docusate  1 tablet Oral QHS  ? sodium chloride flush  3 mL Intravenous Q12H  ? ? ?PRN meds: ?acetaminophen **OR** acetaminophen, albuterol, ALPRAZolam, bisacodyl, bisacodyl, diclofenac Sodium, HYDROcodone-acetaminophen, magnesium hydroxide, ondansetron **OR** ondansetron (ZOFRAN) IV  ? ?Antimicrobials: ?Anti-infectives (From admission, onward)  ? ? Start     Dose/Rate Route Frequency Ordered Stop  ? 07/26/21 1500  cefTRIAXone (ROCEPHIN) 1 g in sodium chloride 0.9 % 100 mL IVPB       ? 1 g ?200 mL/hr over 30 Minutes Intravenous Every 24 hours 07/26/21 1250 07/27/21 1459  ? 07/22/21 1500  cefTRIAXone (ROCEPHIN) 1 g in sodium chloride 0.9 % 100 mL IVPB  Status:  Discontinued       ? 1 g ?200 mL/hr over 30 Minutes Intravenous Every 24 hours 07/22/21 1403 07/26/21 1250  ? ?  ? ? ?Objective: ?Vitals:  ? 07/25/21 1954 07/26/21 0745  ?BP: 134/88 110/67  ?Pulse: 79 (!) 54  ?Resp: 16 14  ?Temp: 99.2 ?F (37.3 ?C) 97.7 ?F (36.5 ?C)  ?SpO2: 93% 95%  ? ? ?Intake/Output Summary (Last 24 hours) at 07/26/2021 1250 ?Last data filed at 07/26/2021 0932 ?Gross per 24 hour  ?Intake --  ?Output 1000 ml  ?Net -1000 ml  ? ?Filed Weights  ? 07/22/21 1309  ?Weight: 62.4 kg  ? ?Weight change:  ?Body mass index is 26.14 kg/m?.  ? ?Physical Exam: ?General exam: Pleasant elderly Caucasian female.   Not in physical distress ?Skin: Skin lesion as described above. ?HEENT: Atraumatic, normocephalic, no obvious bleeding ?Lungs: Clear to auscultation bilaterally ?CVS: Regular rate and rhythm, no murmur ?GI/Abd soft, nontender, nondistended, bowel sound present ?CNS: Alert, awake, oriented x3.  Clumsy movement of extremities. ?Psychiatry: Sad affect ?Extremities: No pedal edema, no calf tenderness, ? ?Data Review: I have personally reviewed the laboratory data and studies available. ? ?F/u labs ordered ?Unresulted Labs (From admission, onward)  ? ?  Start     Ordered  ? 07/22/21 0358  Urinalysis, Routine w reflex microscopic  Once,   STAT       ? 07/22/21 0358  ? 07/22/21 0358  Urine rapid drug screen (hosp performed)  ONCE - STAT,   STAT       ?  07/22/21 0358  ? ?  ?  ? ?  ? ? ?Signed, ?Terrilee Croak, MD ?[facility] ?07/26/2021 ? ? ? ? ? ? ? ? ? ? ? ? ?

## 2021-07-26 NOTE — Progress Notes (Signed)
Physical Therapy Treatment ?Patient Details ?Name: Angela Medina ?MRN: [id number] ?DOB: 04-Oct-1950 ?Today's Date: 07/26/2021 ? ? ?History of Present Illness Pt is a 71 y.o. F who presents 07/21/2021 with progressively worsening weakness, numbness, tingling in arms and legs as well as inability to walk for months. MRI cervical spine showed severe stenosis at C3-C4 level due to degenerative anterolisthesis and significant kyphosis.  There is also severe degenerative changes throughout with moderate to severe foraminal narrowing and C4-C8.  There is cord signal change at C3-C4 reaching near the C2-C3 disc space. Neurosurgery recommending surgery and pt declining. Significant PMH: RA, schizoaffective disorder, depression. ? ?  ?PT Comments  ? ? Pt received supine and perseverating on recent MD visit and requesting pain meds, RN notified, educated pt how to operate call bell. Pt requesting reposition in bed, encouraged sitting EOB but pt declining. Pt able to lift BUEs overhead to assist in coming toward [facility] and able to partial roll R/L with min assist. Pt able to come to partial long sitting in bed with min assist to elevate trunk. Pt with fair tolerance for UE therex. Current plan remains appropriate to address deficits and maximize functional independence. ?  ?Recommendations for follow up therapy are one component of a multi-disciplinary discharge planning process, led by the attending physician.  Recommendations may be updated based on patient status, additional functional criteria and insurance authorization. ? ?Follow Up Recommendations ? Skilled nursing-short term rehab (<3 hours/day) ?  ?  ?Assistance Recommended at Discharge Frequent or constant Supervision/Assistance  ?Patient can return home with the following A lot of help with walking and/or transfers;A lot of help with bathing/dressing/bathroom ?  ?Equipment Recommendations ? Other (comment) (TBA)  ?  ?Recommendations for Other Services   ? ? ?  ?Precautions  / Restrictions Precautions ?Precautions: Fall ?Restrictions ?Weight Bearing Restrictions: No  ?  ? ?Mobility ? Bed Mobility ?  ?  ?  ?  ?  ?  ?  ?General bed mobility comments: pt refusing due to pain ?  ? ?Transfers ?  ?  ?  ?  ?  ?  ?  ?  ?  ?General transfer comment: pt refusing due to pain ?  ? ?Ambulation/Gait ?  ?  ?  ?  ?  ?  ?  ?  ? ? ?Stairs ?  ?  ?  ?  ?  ? ? ?Wheelchair Mobility ?  ? ?Modified Rankin (Stroke Patients Only) ?  ? ? ?  ?Balance   ?  ?  ?  ?  ?  ?  ?  ?  ?  ?  ?  ?  ?  ?  ?  ?  ?  ?  ?  ? ?  ?Cognition Arousal/Alertness: Awake/alert ?Behavior During Therapy: [facility] for tasks assessed/performed ?Overall Cognitive Status: History of cognitive impairments - at baseline ?  ?  ?  ?  ?  ?  ?  ?  ?  ?  ?  ?  ?  ?  ?  ?  ?General Comments: Psych hx. ?  ?  ? ?  ?Exercises General Exercises - Upper Extremity ?Shoulder Flexion: AROM, Both, 10 reps, Supine ?Shoulder Extension: AROM, Both, 10 reps, Supine ?Elbow Flexion: AROM, Both, 10 reps, Supine ?Elbow Extension: AROM, Both, 10 reps, Supine ? ?  ?General Comments General comments (skin integrity, edema, etc.): pt preoccupied with and perseverating on RN giving pain meds, limited with mobility she wants to perform and questions to  answer, often agitated ?  ?  ? ?Pertinent Vitals/Pain Pain Assessment ?Pain Assessment: Faces ?Faces Pain Scale: Hurts whole lot ?Pain Location: BLE ?Pain Descriptors / Indicators: Discomfort, Spasm, Moaning ?Pain Intervention(s): Limited activity within patient's tolerance, Monitored during session, Patient requesting pain meds-RN notified  ? ? ?Home Living   ?  ?  ?  ?  ?  ?  ?  ?  ?  ?   ?  ?Prior Function    ?  ?  ?   ? ?PT Goals (current goals can now be found in the care plan section) Acute Rehab PT Goals ?PT Goal Formulation: With patient ?Time For Goal Achievement: 08/07/21 ? ?  ?Frequency ? ? ? Min 2X/week ? ? ? ?  ?PT Plan    ? ? ?Co-evaluation   ?  ?  ?  ?  ? ?  ?AM-PAC PT "6 Clicks" Mobility   ?Outcome Measure ? Help  needed turning from your back to your side while in a flat bed without using bedrails?: A Lot ?Help needed moving from lying on your back to sitting on the side of a flat bed without using bedrails?: Total ?Help needed moving to and from a bed to a chair (including a wheelchair)?: Total ?Help needed standing up from a chair using your arms (e.g., wheelchair or bedside chair)?: Total ?Help needed to walk in hospital room?: Total ?Help needed climbing 3-5 steps with a railing? : Total ?6 Click Score: 7 ? ?  ?End of Session   ?Activity Tolerance: Other (comment) (limited d/t behavior) ?Patient left: in bed;with call bell/phone within reach ?Nurse Communication: Mobility status;Patient requests pain meds ?PT Visit Diagnosis: Other abnormalities of gait and mobility (R26.89);Muscle weakness (generalized) (M62.81) ?  ? ? ?Time: [id number] ?PT Time Calculation (min) (ACUTE ONLY): 12 min ? ?Charges:  $Therapeutic Activity: 8-22 mins          ?          ? ?Angela Boysnna R. PTA ?Acute Rehabilitation Services ?Office: [phone] ? ? ?Angela Salvagenna M Monifa Blanchette ?07/26/2021, 11:17 AM ? ?

## 2021-07-27 DIAGNOSIS — M4712 Other spondylosis with myelopathy, cervical region: Secondary | ICD-10-CM | POA: Diagnosis not present

## 2021-07-27 MED ORDER — FENTANYL 12 MCG/HR TD PT72
1.0000 | MEDICATED_PATCH | TRANSDERMAL | Status: DC
  Filled 2021-07-27: qty 1

## 2021-07-27 NOTE — Progress Notes (Signed)
?PROGRESS NOTE ? ?Angela Medina  ?DOB: 22-Dec-1950  ?PCP: Patient, No Pcp Per (Inactive) ?EXB:[id number]  ?DOA: 07/21/2021 ? LOS: 5 days  ?Hospital Day: 7 ? ?Brief narrative: ?Angela Medina is a 71 y.o. female with PMH significant for homelessness, rheumatoid arthritis, schizoaffective disorder, depression, chronic impairment of mobility, smokes a pack of cigarette every day. ?She presented to the ED on 07/21/2021 with progressively worsening weakness, numbness, tingling in her arms and legs as well as inability to walk for months.  She also complains of shooting lightening sensation throughout her whole body with certain movements. ?She has numbness and tingling arms and is unable to raise her arms above a certain point.  Patient reports decreased ability to grabs things with her hands for which she almost at the point of being unable to do anything for herself.   ? ?In the ED, patient was afebrile, hemodynamically stable ?Labs unremarkable ?X-rays of the hip and pelvis did not show any acute abnormality.   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   ?Dr. Reatha Armour of neurosurgery was consulted.   ?Patient was given Decadron 10 mg IV.   ?Admitted to hospitalist service.  ? ?Subjective: ?Patient was seen and examined this morning.  Lying on bed.  ?Complains of inadequate pain control.  Has not had a good bowel movement in several days. ? ?Principal Problem: ?  Cervical spondylosis with myelopathy ?Active Problems: ?  Skin infection ?  Schizoaffective disorder ([place]) ?  Tobacco abuse ?  Debility ?  Arthritis ?  ?Assessment and Plan: ?Cervical spondylitic myelopathy with severe stenosis  ?-MRI cervical spine showed severe stenosis at C3-C4 level due to degenerative anterolisthesis and  significant kyphosis.  There is also severe degenerative changes throughout with moderate to severe foraminal narrowing and C4-C8.  There is cord signal change at C3-C4 reaching near the C2-C3 disc space. ?-Neurosurgery was consulted.  Patient was recommended surgical intervention given her progressive myelopathic symptoms.  Patient understands the severity of symptoms.  She states he has rheumatoid arthritis and was warned in the past that she may get ultimately paralyzed.  She does not want surgical intervention at all even if this leads to her death. ?-1 dose of IV Decadron was given in the ED.  Currently on Neurontin 100 mg 3 times daily and hydrocodone as needed.  I added fentanyl patch today because of inadequate pain control. ? ?Skin infection ?-Patient has a skin infection that she describes as itchy in nature of her abdomen and upper leg.  Low ESR and CRP.  ?-On empiric IV Rocephin and clobetasol cream.  Complete 5-day course of IV Rocephin. ?  ?Schizoaffective disorder  ?-Continue olanzapine ?-Psychiatry consulted ?  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 ?  ?Tobacco abuse ?-Patient smokes a pack of cigarettes per day on average. ?-Nicotine patch offered ?-Continue to encourage cessation of tobacco use ? ?Insomnia ?-Xanax as needed ? ?Constipation ?-Continue scheduled senna and as needed Dulcolax.  ? ?Goals of care ?  Code Status: Full Code  ? ? ?Mobility: Limited mobility.  PT eval ? ?Nutritional status:  ?Body mass index is 26.14 kg/m?.  ?  ?  ? ? ? ? ?Diet:  ?Diet Order   ? ?       ?  DIET DYS 3 Room service appropriate? No; Fluid consistency: Thin  Diet effective now       ?  ? ?  ?  ? ?  ? ? ?DVT prophylaxis:  ?enoxaparin (LOVENOX) injection 40 mg Start: 07/22/21 1200 ?  ?Antimicrobials: Completed course of IV Rocephin ?Fluid: None ?Consultants: Neurosurgery, psychiatry ?Family Communication: None at bedside ? ?Status  is: Inpatient ? ?Continue in-hospital care because: Pending long-term placement ?Level of care: Med-Surg  ? ?Dispo: The patient is from: Homeless ?             Anticipated d/c is to: Pending long-term placement ?             Patient currently is not medically stable to d/c. ?Difficult to place patient No ? ? ?Infusions:  ? ? ? ?Scheduled Meds: ? clobetasol ointment   Topical BID  ? enoxaparin (LOVENOX) injection  40 mg Subcutaneous Q24H  ? fentaNYL  1 patch Transdermal Q72H  ? gabapentin  100 mg Oral TID  ? leptospermum manuka honey  1 application. Topical Daily  ? melatonin  3 mg Oral q1800  ? nicotine  21 mg Transdermal Daily  ? OLANZapine  10 mg Oral QHS  ? polyethylene glycol  17 g Oral Once  ? saccharomyces boulardii  250 mg Oral BID  ? senna-docusate  1 tablet Oral QHS  ? sodium chloride flush  3 mL Intravenous Q12H  ? ? ?PRN meds: ?acetaminophen **OR** acetaminophen, albuterol, ALPRAZolam, bisacodyl, bisacodyl, diclofenac Sodium, HYDROcodone-acetaminophen, ondansetron **OR** ondansetron (ZOFRAN) IV  ? ?Antimicrobials: ?Anti-infectives (From admission, onward)  ? ? Start     Dose/Rate Route Frequency Ordered Stop  ? 07/26/21 1500  cefTRIAXone (ROCEPHIN) 1 g in sodium chloride 0.9 % 100 mL IVPB       ? 1 g ?200 mL/hr over 30 Minutes Intravenous Every 24 hours 07/26/21 1250 07/26/21 1534  ? 07/22/21 1500  cefTRIAXone (ROCEPHIN) 1 g in sodium chloride 0.9 % 100 mL IVPB  Status:  Discontinued       ? 1 g ?200 mL/hr over 30 Minutes Intravenous Every 24 hours 07/22/21 1403 07/26/21 1250  ? ?  ? ? ?Objective: ?Vitals:  ? 07/27/21 0634 07/27/21 0739  ?BP: 115/67 136/84  ?Pulse: 62 (!) 44  ?Resp: 17 17  ?Temp:  97.6 ?F (36.4 ?C)  ?SpO2: 100% 94%  ? ? ?Intake/Output Summary (Last 24 hours) at 07/27/2021 1506 ?Last data filed at 07/27/2021 7895 ?Gross per 24 hour  ?Intake 340 ml  ?Output 2200 ml  ?Net -1860 ml  ? ?Filed Weights  ? 07/22/21 1309  ?Weight: 62.4 kg  ? ?Weight change:  ?Body mass index is 26.14 kg/m?.   ? ?Physical Exam: ?General exam: Pleasant elderly Caucasian female.  Not in physical distress ?Skin: Skin lesion as described above. ?HEENT: Atraumatic, normocephalic, no obvious bleeding ?Lungs: Clear to auscultation bilaterally ?CVS: Regular rate and rhythm, no murmur ?GI/Abd soft, nontender, nondistended, bowel sound present ?CNS: Alert, awake, oriented x3.  Clumsy movement of extremities. ?Psychiatry: Sad affect ?Extremities: No pedal edema, no calf tenderness, ? ?Data Review: I have personally reviewed the laboratory data and studies available. ? ?F/u labs ordered ?Unresulted Labs (From admission, onward)  ? ?  Start     Ordered  ? 07/22/21 0358  Urinalysis, Routine w reflex microscopic  Once,   STAT       ? 07/22/21 0358  ? 07/22/21 0358  Urine rapid drug screen (hosp performed)  ONCE - STAT,  STAT       ? 07/22/21 0358  ? ?  ?  ? ?  ? ? ?Signed, ?Terrilee Croak, MD ?[facility] ?07/27/2021 ? ? ? ? ? ? ? ? ? ? ? ? ?

## 2021-07-27 NOTE — Progress Notes (Addendum)
Patient states gabapentin causes drowsiness and makes her sleepy. She stated this after waking up from dose given at 10:35.  ? ?She also requested that the Fentanyl patch be removed less than 1 hour of it being placed. She stated it makes her numbness condition worse and was told she should not take it with Vicodin. Insisted patch be removed.  ? ?Patient refused wound care and to wear foot elevators.  ?

## 2021-07-28 DIAGNOSIS — M4712 Other spondylosis with myelopathy, cervical region: Secondary | ICD-10-CM | POA: Diagnosis not present

## 2021-07-28 MED ORDER — BISACODYL 10 MG RE SUPP
10.0000 mg | Freq: Every day | RECTAL | Status: AC
Start: 2021-07-29 — End: 2021-07-31
  Administered 2021-07-29 – 2021-07-31 (×3): 10 mg via RECTAL
  Filled 2021-07-28 (×3): qty 1

## 2021-07-28 MED ORDER — MAGNESIUM HYDROXIDE 400 MG/5ML PO SUSP
30.0000 mL | Freq: Every day | ORAL | Status: AC
  Administered 2021-07-28 – 2021-07-29 (×2): 30 mL via ORAL
  Filled 2021-07-28 (×2): qty 30

## 2021-07-28 MED ORDER — DIPHENHYDRAMINE HCL 25 MG PO CAPS
25.0000 mg | ORAL_CAPSULE | Freq: Four times a day (QID) | ORAL | Status: DC | PRN
  Administered 2021-07-28 – 2021-08-02 (×8): 25 mg via ORAL
  Filled 2021-07-28 (×8): qty 1

## 2021-07-28 NOTE — Progress Notes (Signed)
?PROGRESS NOTE ? ?[name]  ?DOB: 05/30/1950  ?PCP: Patient, No Pcp Per (Inactive) ?URK:[id number]  ?DOA: 07/21/2021 ? LOS: 6 days  ?Hospital Day: 8 ? ?Brief narrative: ?Angela Medina is a 71 y.o. female with PMH significant for homelessness, rheumatoid arthritis, schizoaffective disorder, depression, chronic impairment of mobility, smokes a pack of cigarette every day. ?She presented to the ED on 07/21/2021 with progressively worsening weakness, numbness, tingling in her arms and legs as well as inability to walk for months.  She also complains of shooting lightening sensation throughout her whole body with certain movements. ?She has numbness and tingling arms and is unable to raise her arms above a certain point.  Patient reports decreased ability to grabs things with her hands for which she almost at the point of being unable to do anything for herself.   ? ?In the ED, patient was afebrile, hemodynamically stable ?Labs unremarkable ?X-rays of the hip and pelvis did not show any acute abnormality.   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   ?Dr. Reatha Armour of neurosurgery was consulted.   ?Patient was given Decadron 10 mg IV.   ?Admitted to hospitalist service.  ? ?Subjective: ?Patient was seen and examined this morning.  Lying on bed.  ?Complains of inadequate pain control.  Has not had a good bowel movement in several days. ? ?Principal Problem: ?  Cervical spondylosis with myelopathy ?Active Problems: ?  Skin infection ?  Schizoaffective disorder ([place]) ?  Tobacco abuse ?  Debility ?  Arthritis ?  ?Assessment and Plan: ?Cervical spondylitic myelopathy with severe stenosis  ?-MRI cervical spine showed severe stenosis at C3-C4 level due to degenerative anterolisthesis and  significant kyphosis.  There is also severe degenerative changes throughout with moderate to severe foraminal narrowing and C4-C8.  There is cord signal change at C3-C4 reaching near the C2-C3 disc space. ?-Neurosurgery was consulted.  Patient was recommended surgical intervention given her progressive myelopathic symptoms.  Patient understands the severity of symptoms.  She states he has rheumatoid arthritis and was warned in the past that she may get ultimately paralyzed.  She does not want surgical intervention at all even if this leads to her death. ?-1 dose of IV Decadron was given in the ED.  Currently on Neurontin 100 mg 3 times daily and hydrocodone as needed.  I ordered fentanyl patch but patient wanted to avoid it. ? ?Skin infection ?-Patient has a skin infection that she describes as itchy in nature of her abdomen and upper leg.  Low ESR and CRP.  ?-On empiric IV Rocephin and clobetasol cream.  Completed 5-day course of IV Rocephin. ?  ?Schizoaffective disorder  ?-Continue olanzapine ?-Psychiatry consulted ?  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 ?  ?Tobacco abuse ?-Patient smokes a pack of cigarettes per day on average. ?-Nicotine patch offered ?-Continue to encourage cessation of tobacco use ? ?Insomnia ?-Xanax as needed ? ?Constipation ?-Continue scheduled senna and as needed Dulcolax.  No satisfactory bowel movement yet.  Continue milk of magnesia and Dulcolax scheduled for now. ? ?Goals of care ?  Code Status: Full Code  ? ? ?Mobility: Limited mobility.  PT eval ? ?Nutritional status:  ?Body mass index is 26.14 kg/m?.  ?  ?  ? ? ? ? ?  Diet:  ?Diet Order   ? ?       ?  DIET DYS 3 Room service appropriate? No; Fluid consistency: Thin  Diet effective now       ?  ? ?  ?  ? ?  ? ? ?DVT prophylaxis:  ?enoxaparin (LOVENOX) injection 40 mg Start: 07/22/21 1200 ?  ?Antimicrobials: Completed course of IV Rocephin ?Fluid:  None ?Consultants: Neurosurgery, psychiatry ?Family Communication: None at bedside ? ?Status is: Inpatient ? ?Continue in-hospital care because: Pending long-term placement ?Level of care: Med-Surg  ? ?Dispo: The patient is from: Homeless ?             Anticipated d/c is to: Pending long-term placement ?             Patient currently is not medically stable to d/c. ?Difficult to place patient No ? ? ?Infusions:  ? ? ? ?Scheduled Meds: ? [START ON 07/29/2021] bisacodyl  10 mg Rectal Daily  ? clobetasol ointment   Topical BID  ? enoxaparin (LOVENOX) injection  40 mg Subcutaneous Q24H  ? gabapentin  100 mg Oral TID  ? leptospermum manuka honey  1 application. Topical Daily  ? magnesium hydroxide  30 mL Oral Daily  ? melatonin  3 mg Oral q1800  ? nicotine  21 mg Transdermal Daily  ? OLANZapine  10 mg Oral QHS  ? saccharomyces boulardii  250 mg Oral BID  ? senna-docusate  1 tablet Oral QHS  ? sodium chloride flush  3 mL Intravenous Q12H  ? ? ?PRN meds: ?acetaminophen **OR** acetaminophen, albuterol, ALPRAZolam, diclofenac Sodium, diphenhydrAMINE, HYDROcodone-acetaminophen, ondansetron **OR** ondansetron (ZOFRAN) IV  ? ?Antimicrobials: ?Anti-infectives (From admission, onward)  ? ? Start     Dose/Rate Route Frequency Ordered Stop  ? 07/26/21 1500  cefTRIAXone (ROCEPHIN) 1 g in sodium chloride 0.9 % 100 mL IVPB       ? 1 g ?200 mL/hr over 30 Minutes Intravenous Every 24 hours 07/26/21 1250 07/26/21 1534  ? 07/22/21 1500  cefTRIAXone (ROCEPHIN) 1 g in sodium chloride 0.9 % 100 mL IVPB  Status:  Discontinued       ? 1 g ?200 mL/hr over 30 Minutes Intravenous Every 24 hours 07/22/21 1403 07/26/21 1250  ? ?  ? ? ?Objective: ?Vitals:  ? 07/28/21 0738 07/28/21 1447  ?BP: 126/77 132/74  ?Pulse: 60 69  ?Resp: 19 16  ?Temp: 98.2 ?F (36.8 ?C) 98 ?F (36.7 ?C)  ?SpO2: (!) 88% 93%  ? ? ?Intake/Output Summary (Last 24 hours) at 07/28/2021 1536 ?Last data filed at 07/28/2021 0557 ?Gross per 24 hour  ?Intake 1200 ml  ?Output 2400 ml  ?Net -1200  ml  ? ? ?Filed Weights  ? 07/22/21 1309  ?Weight: 62.4 kg  ? ?Weight change:  ?Body mass index is 26.14 kg/m?.  ? ?Physical Exam: ?General exam: Pleasant elderly Caucasian female.  Not in physical distress ?Skin: Skin lesion as described above. ?HEENT: Atraumatic, normocephalic, no obvious bleeding ?Lungs: Clear to auscultation bilaterally ?CVS: Regular rate and rhythm, no murmur ?GI/Abd soft, nontender, nondistended, bowel sound present ?CNS: Alert, awake, oriented x3.  Clumsy movement of extremities. ?Psychiatry: Mood appropriate ?Extremities: No pedal edema, no calf tenderness, ? ?Data Review: I have personally reviewed the laboratory data and studies available. ? ?F/u labs ordered ?Unresulted Labs (From admission, onward)  ? ?  Start     Ordered  ? 07/22/21 0358  Urinalysis, Routine w reflex microscopic  Once,   STAT       ?  07/22/21 0358  ? 07/22/21 0358  Urine rapid drug screen (hosp performed)  ONCE - STAT,   STAT       ? 07/22/21 0358  ? ?  ?  ? ?  ? ? ?Signed, ?Terrilee Croak, MD ?[facility] ?07/28/2021 ? ? ? ? ? ? ? ? ? ? ? ? ?

## 2021-07-29 DIAGNOSIS — M4712 Other spondylosis with myelopathy, cervical region: Secondary | ICD-10-CM | POA: Diagnosis not present

## 2021-07-29 NOTE — Plan of Care (Signed)

## 2021-07-29 NOTE — Progress Notes (Signed)
?PROGRESS NOTE ? ?[name]  ?DOB: 09/19/1950  ?PCP: Patient, No Pcp Per (Inactive) ?ZOX:[id number]  ?DOA: 07/21/2021 ? LOS: 7 days  ?Hospital Day: 9 ? ?Brief narrative: ?KYNSLEE BAHAM is a 71 y.o. female with PMH significant for homelessness, rheumatoid arthritis, schizoaffective disorder, depression, chronic impairment of mobility, smokes a pack of cigarette every day. ?She presented to the ED on 07/21/2021 with progressively worsening weakness, numbness, tingling in her arms and legs as well as inability to walk for months.  She also complains of shooting lightening sensation throughout her whole body with certain movements. ?She has numbness and tingling arms and is unable to raise her arms above a certain point.  Patient reports decreased ability to grabs things with her hands for which she almost at the point of being unable to do anything for herself.   ? ?In the ED, patient was afebrile, hemodynamically stable ?Labs unremarkable ?X-rays of the hip and pelvis did not show any acute abnormality.   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   ?Dr. Reatha Armour of neurosurgery was consulted.   ?Patient was given Decadron 10 mg IV.   ?Admitted to hospitalist service.  ? ?Patient was offered surgical management which she refused. ?Pending placement at this time. ? ?Subjective: ?Patient was seen and examined this morning.  Lying on bed.  ?Nonspecific complaints.  Wanted a higher dose of Xanax.  But understands the risk of oversedation and agrees to continue current dose. ? ?Principal Problem: ?  Cervical spondylosis with myelopathy ?Active Problems: ?  Skin infection ?  Schizoaffective disorder ([place]) ?  Tobacco abuse ?  Debility ?  Arthritis ?  ?Assessment and Plan: ?Cervical  spondylitic myelopathy with severe stenosis  ?-MRI cervical spine showed severe stenosis at C3-C4 level due to degenerative anterolisthesis and significant kyphosis.  There is also severe degenerative changes throughout with moderate to severe foraminal narrowing and C4-C8.  There is cord signal change at C3-C4 reaching near the C2-C3 disc space. ?-Neurosurgery was consulted.  Patient was recommended surgical intervention given her progressive myelopathic symptoms.  Patient understands the severity of symptoms.  She states he has rheumatoid arthritis and was warned in the past that she may get ultimately paralyzed.  She does not want surgical intervention at all even if this leads to her death. ?-Currently on Neurontin 100 mg 3 times daily and hydrocodone every 4 hours as needed.  I ordered fentanyl patch but patient wanted to avoid it. ? ?Skin infection ?-Patient has a skin infection that she describes as itchy in nature of her abdomen and upper leg.  Low ESR and CRP.  ?-Completed 5-day course of IV Rocephin. ?  ?Schizoaffective disorder  ?-Continue olanzapine ?-Psychiatry consult appreciated ?  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 ?  ?Tobacco abuse ?-Patient smokes a pack of cigarettes per day on average. ?-Nicotine patch offered ?-Continue to encourage cessation of tobacco use ? ?Insomnia ?-Xanax as needed ? ?Constipation ?-Continue scheduled senna and as needed Dulcolax.  No satisfactory bowel movement yet.  Continue milk of magnesia and Dulcolax scheduled for now. ? ?Goals of care ?  Code Status: Full Code  ? ? ?Mobility: Limited mobility.  PT eval ? ?Nutritional status:  ?Body mass  index is 26.14 kg/m?.  ?  ?  ? ? ? ? ?Diet:  ?Diet Order   ? ?       ?  DIET DYS 3 Room service appropriate? No; Fluid consistency: Thin  Diet effective now       ?  ? ?  ?  ? ?  ? ? ?DVT prophylaxis:  ?enoxaparin (LOVENOX) injection 40 mg Start:  07/22/21 1200 ?  ?Antimicrobials: Completed course of IV Rocephin ?Fluid: None ?Consultants: Neurosurgery, psychiatry ?Family Communication: None at bedside ? ?Status is: Inpatient ? ?Continue in-hospital care because: Pending long-term placement ?Level of care: Med-Surg  ? ?Dispo: The patient is from: Homeless ?             Anticipated d/c is to: Pending long-term placement ?             Patient currently is not medically stable to d/c. ?Difficult to place patient No ? ? ?Infusions:  ? ? ? ?Scheduled Meds: ? bisacodyl  10 mg Rectal Daily  ? clobetasol ointment   Topical BID  ? enoxaparin (LOVENOX) injection  40 mg Subcutaneous Q24H  ? gabapentin  100 mg Oral TID  ? leptospermum manuka honey  1 application. Topical Daily  ? melatonin  3 mg Oral q1800  ? nicotine  21 mg Transdermal Daily  ? OLANZapine  10 mg Oral QHS  ? saccharomyces boulardii  250 mg Oral BID  ? senna-docusate  1 tablet Oral QHS  ? sodium chloride flush  3 mL Intravenous Q12H  ? ? ?PRN meds: ?acetaminophen **OR** acetaminophen, albuterol, ALPRAZolam, diclofenac Sodium, diphenhydrAMINE, HYDROcodone-acetaminophen, ondansetron **OR** ondansetron (ZOFRAN) IV  ? ?Antimicrobials: ?Anti-infectives (From admission, onward)  ? ? Start     Dose/Rate Route Frequency Ordered Stop  ? 07/26/21 1500  cefTRIAXone (ROCEPHIN) 1 g in sodium chloride 0.9 % 100 mL IVPB       ? 1 g ?200 mL/hr over 30 Minutes Intravenous Every 24 hours 07/26/21 1250 07/26/21 1534  ? 07/22/21 1500  cefTRIAXone (ROCEPHIN) 1 g in sodium chloride 0.9 % 100 mL IVPB  Status:  Discontinued       ? 1 g ?200 mL/hr over 30 Minutes Intravenous Every 24 hours 07/22/21 1403 07/26/21 1250  ? ?  ? ? ?Objective: ?Vitals:  ? 07/29/21 0500 07/29/21 0752  ?BP: 132/70 108/67  ?Pulse: 68 (!) 52  ?Resp: 14 17  ?Temp: 98.2 ?F (36.8 ?C) 97.6 ?F (36.4 ?C)  ?SpO2: 98% 93%  ? ? ?Intake/Output Summary (Last 24 hours) at 07/29/2021 1134 ?Last data filed at 07/28/2021 2005 ?Gross per 24 hour  ?Intake --  ?Output 900 ml   ?Net -900 ml  ? ?Filed Weights  ? 07/22/21 1309  ?Weight: 62.4 kg  ? ?Weight change:  ?Body mass index is 26.14 kg/m?.  ? ?Physical Exam: ?General exam: Pleasant elderly Caucasian female.  Not in physical distress ?Skin: Skin lesion as described above. ?HEENT: Atraumatic, normocephalic, no obvious bleeding ?Lungs: Clear to auscultation bilaterally ?CVS: Regular rate and rhythm, no murmur ?GI/Abd soft, nontender, nondistended, bowel sound present ?CNS: Alert, awake, oriented x3.  Clumsy movement of extremities. ?Psychiatry: Mood appropriate ?Extremities: No pedal edema, no calf tenderness, ? ?Data Review: I have personally reviewed the laboratory data and studies available. ? ?F/u labs ordered ?Unresulted Labs (From admission, onward)  ? ? None  ? ?  ? ? ?Signed, ?Terrilee Croak, MD ?[facility] ?07/29/2021 ? ? ? ? ? ? ? ? ? ? ? ? ?

## 2021-07-30 DIAGNOSIS — G4700 Insomnia, unspecified: Secondary | ICD-10-CM | POA: Diagnosis present

## 2021-07-30 DIAGNOSIS — K5900 Constipation, unspecified: Secondary | ICD-10-CM

## 2021-07-30 DIAGNOSIS — M4712 Other spondylosis with myelopathy, cervical region: Secondary | ICD-10-CM | POA: Diagnosis not present

## 2021-07-30 MED ORDER — POLYETHYLENE GLYCOL 3350 17 G PO PACK
17.0000 g | PACK | Freq: Two times a day (BID) | ORAL | Status: DC
  Administered 2021-07-31 – 2021-08-03 (×4): 17 g via ORAL
  Filled 2021-07-30 (×6): qty 1

## 2021-07-30 MED ORDER — MELATONIN 5 MG PO TABS
5.0000 mg | ORAL_TABLET | Freq: Every day | ORAL | Status: DC
  Administered 2021-07-30 – 2021-08-02 (×4): 5 mg via ORAL
  Filled 2021-07-30 (×4): qty 1

## 2021-07-30 MED ORDER — SENNOSIDES-DOCUSATE SODIUM 8.6-50 MG PO TABS
2.0000 | ORAL_TABLET | Freq: Two times a day (BID) | ORAL | Status: DC
  Administered 2021-07-30 – 2021-08-03 (×7): 2 via ORAL
  Filled 2021-07-30 (×7): qty 2

## 2021-07-30 NOTE — Assessment & Plan Note (Addendum)
Continue melatonin.  Alprazolam as needed. ?

## 2021-07-30 NOTE — Hospital Course (Addendum)
71 y.o. female with PMH significant for homelessness, rheumatoid arthritis, schizoaffective disorder, depression, chronic impairment of mobility, smokes a pack of cigarette every day. She presented to the ED on 07/21/2021 with progressively worsening weakness, numbness, tingling in her arms and legs as well as inability to walk for months. MRI of the cervical spine significant for degenerative spondylolysis at C3-C4 with severe spinal stenosis and evidence of cord compression. 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  Neurosurgery was consulted.  Patient was offered surgical management which she refused.  Manage conservatively.  Neurologically she has been stable. ?

## 2021-07-30 NOTE — Assessment & Plan Note (Signed)
Seen by PT and OT.  Skilled nursing facility is recommended for rehabilitation ?

## 2021-07-30 NOTE — Plan of Care (Signed)

## 2021-07-30 NOTE — Assessment & Plan Note (Addendum)
She was constipated for several days despite aggressive bowel regimen.  TSH was normal in February.   ?Finally she had multiple bowel movements.  Continue MiraLAX and Senokot.  Abdomen remains benign. ?

## 2021-07-30 NOTE — Progress Notes (Signed)
PT Cancellation Note ? ?Patient Details ?Name: Jacqualin CombesRebecca S Lucado ?MRN: [id number] ?DOB: 02/09/1951 ? ? ?Cancelled Treatment:    Reason Eval/Treat Not Completed: Fatigue/lethargy limiting ability to participate, pt declining stating she was too tired. Will check back/re-attempt as schedule allows to continue with PT POC. ? ? ? ?Marlana Salvagenna M Claryce Friel ?07/30/2021, 1:59 PM ?

## 2021-07-30 NOTE — Progress Notes (Signed)
? ?[facility] ?PROGRESS NOTE ? ? ?Angela Medina ZOX:[id number] DOB: Dec 04, 1950 DOA: 07/21/2021  8 ?DOS: the patient was seen and examined on 07/30/2021 ? ?PCP: Patient, No Pcp Per (Inactive) ? ?Brief History and Hospital Course:  ?71 y.o. female with PMH significant for homelessness, rheumatoid arthritis, schizoaffective disorder, depression, chronic impairment of mobility, smokes a pack of cigarette every day. She presented to the ED on 07/21/2021 with progressively worsening weakness, numbness, tingling in her arms and legs as well as inability to walk for months. MRI of the cervical spine significant for degenerative spondylolysis at C3-C4 with severe spinal stenosis and evidence of cord compression. 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  Neurosurgery was consulted.  Patient was offered surgical management which she refused. ? ?Consultants: Neurosurgery ? ?Procedures: None ? ? ? ?Subjective: ?Patient mentions that she still has not had a bowel movement.  Denies any neck pain.  Able to move her arms and legs. ? ? ? ?Assessment/Plan: ? ? ?* Cervical spondylosis with myelopathy ?-MRI cervical spine showed severe stenosis at C3-C4 level due to degenerative anterolisthesis and significant kyphosis.   ?-Neurosurgery was consulted.    Discussion was held with patient regarding surgical intervention by neurosurgery as well as previous rounding MDs.  She declined surgery.  Even though psychiatry said patient may not have capacity to decide regarding surgery neurosurgery did not feel that there was an emergent indication to force her to undergo surgical intervention. ?Symptomatic treatment including pain medications. ? ?Skin infection ?Noted in abdominal area as well as upper leg.  Concern for cellulitis.  Completed 5-day course of Rocephin.   ? ?Schizoaffective disorder ([facility]) ?Continue olanzapine.  Seen by psychiatry.   ? ?Arthritis ?Patient has significant ulnar deviation with nodules of the bilateral hands as  well as nodules on her feet. ? ?Debility ?Seen by PT and OT.  Skilled nursing facility is recommended for rehabilitation ? ?Tobacco abuse ?Counseled. ? ?Insomnia ?Started on melatonin and as needed alprazolam. ? ?Constipation ?Has not had a bowel movement yet.  Noted to be on Senokot once a day and suppository daily for 3 days.  Will add MiraLAX.  Increase the dose of Senokot.  TSH was normal in February. ? ? ? ?DVT Prophylaxis: Lovenox ?Code Status: Full code ?Family Communication: Discussed with patient.  No family at bedside ?Disposition Plan: SNF recommended ? ?Status is: Inpatient ?Remains inpatient appropriate because: Difficult placement ? ? ? ? ?Medications: Scheduled: ? bisacodyl  10 mg Rectal Daily  ? clobetasol ointment   Topical BID  ? enoxaparin (LOVENOX) injection  40 mg Subcutaneous Q24H  ? gabapentin  100 mg Oral TID  ? leptospermum manuka honey  1 application. Topical Daily  ? melatonin  3 mg Oral q1800  ? nicotine  21 mg Transdermal Daily  ? OLANZapine  10 mg Oral QHS  ? saccharomyces boulardii  250 mg Oral BID  ? senna-docusate  1 tablet Oral QHS  ? sodium chloride flush  3 mL Intravenous Q12H  ? ?Continuous: ?WJX:BJYNWGNFAOZHY **OR** acetaminophen, albuterol, ALPRAZolam, diclofenac Sodium, diphenhydrAMINE, HYDROcodone-acetaminophen, ondansetron **OR** ondansetron (ZOFRAN) IV ? ?Antibiotics: ?Anti-infectives (From admission, onward)  ? ? Start     Dose/Rate Route Frequency Ordered Stop  ? 07/26/21 1500  cefTRIAXone (ROCEPHIN) 1 g in sodium chloride 0.9 % 100 mL IVPB       ? 1 g ?200 mL/hr over 30 Minutes Intravenous Every 24 hours 07/26/21 1250 07/26/21 1534  ? 07/22/21 1500  cefTRIAXone (ROCEPHIN) 1 g in sodium chloride 0.9 %  100 mL IVPB  Status:  Discontinued       ? 1 g ?200 mL/hr over 30 Minutes Intravenous Every 24 hours 07/22/21 1403 07/26/21 1250  ? ?  ? ? ?Objective: ? ?Vital Signs ? ?Vitals:  ? 07/29/21 0752 07/29/21 1500 07/29/21 1956 07/30/21 0816  ?BP: 108/67 136/89 (!) 156/96 (!)  164/113  ?Pulse: (!) 52 70 77 60  ?Resp: 17 16 18 18   ?Temp: 97.6 ?F (36.4 ?C) 98 ?F (36.7 ?C) 98.3 ?F (36.8 ?C) 98.1 ?F (36.7 ?C)  ?TempSrc: Oral Oral Oral Oral  ?SpO2: 93% 93% 95% 96%  ?Weight:      ?Height:      ? ? ?Intake/Output Summary (Last 24 hours) at 07/30/2021 1059 ?Last data filed at 07/30/2021 [phone] ?Gross per 24 hour  ?Intake 250 ml  ?Output 2550 ml  ?Net -2300 ml  ? ?Filed Weights  ? 07/22/21 1309  ?Weight: 62.4 kg  ? ? ?General appearance: Awake alert.  In no distress ?Resp: Clear to auscultation bilaterally.  Normal effort ?Cardio: S1-S2 is normal regular.  No S3-S4.  No rubs murmurs or bruit ?GI: Abdomen is soft.  Nontender nondistended.  Bowel sounds are present normal.  No masses organomegaly ?Extremities: No edema.  Moving arms and legs equally. ?Neurologic: Alert and oriented x3.  No focal neurological deficits.  ? ? ?Lab Results: ? ?Data Reviewed: I have personally reviewed labs and imaging study reports ? ?CBC: ?Recent Labs  ?Lab 07/24/21 ?0155  ?WBC 7.7  ?NEUTROABS 4.2  ?HGB 11.7*  ?HCT 37.6  ?MCV 100.8*  ?PLT 326  ? ? ?Basic Metabolic Panel: ?Recent Labs  ?Lab 07/24/21 ?0155  ?NA 139  ?K 4.4  ?CL 105  ?CO2 26  ?GLUCOSE 94  ?BUN 31*  ?CREATININE 0.80  ?CALCIUM 8.3*  ? ? ?GFR: ?Estimated Creatinine Clearance: 55.2 mL/min (by C-G formula based on SCr of 0.8 mg/dL). ? ? ?Radiology Studies: ?No results found. ? ? ? ? LOS: 8 days  ? ?Osvaldo Shipper ? ?[facility] ?Pager on [email or web address] ? ?07/30/2021, 10:59 AM ? ? ?

## 2021-07-31 DIAGNOSIS — M4712 Other spondylosis with myelopathy, cervical region: Secondary | ICD-10-CM | POA: Diagnosis not present

## 2021-07-31 DIAGNOSIS — K5900 Constipation, unspecified: Secondary | ICD-10-CM | POA: Diagnosis not present

## 2021-07-31 LAB — CBC
HCT: 37.4 % (ref 36.0–46.0)
Hemoglobin: 12.1 g/dL (ref 12.0–15.0)
MCH: 31.8 pg (ref 26.0–34.0)
MCHC: 32.4 g/dL (ref 30.0–36.0)
MCV: 98.2 fL (ref 80.0–100.0)
Platelets: 280 10*3/uL (ref 150–400)
RBC: 3.81 MIL/uL — ABNORMAL LOW (ref 3.87–5.11)
RDW: 13.1 % (ref 11.5–15.5)
WBC: 6 10*3/uL (ref 4.0–10.5)
nRBC: 0 % (ref 0.0–0.2)

## 2021-07-31 LAB — BASIC METABOLIC PANEL
Anion gap: 6 (ref 5–15)
BUN: 23 mg/dL (ref 8–23)
CO2: 29 mmol/L (ref 22–32)
Calcium: 8.4 mg/dL — ABNORMAL LOW (ref 8.9–10.3)
Chloride: 102 mmol/L (ref 98–111)
Creatinine, Ser: 0.57 mg/dL (ref 0.44–1.00)
GFR, Estimated: >60 mL/min (ref >60)
Glucose, Bld: 104 mg/dL — ABNORMAL HIGH (ref 70–99)
Potassium: 3.5 mmol/L (ref 3.5–5.1)
Sodium: 137 mmol/L (ref 135–145)

## 2021-07-31 MED ORDER — POTASSIUM CHLORIDE CRYS ER 20 MEQ PO TBCR
40.0000 meq | EXTENDED_RELEASE_TABLET | Freq: Once | ORAL | Status: AC
Start: 2021-07-31 — End: 2021-07-31
  Administered 2021-07-31: 40 meq via ORAL
  Filled 2021-07-31: qty 2

## 2021-07-31 MED ORDER — CLONAZEPAM 0.5 MG PO TABS
0.5000 mg | ORAL_TABLET | Freq: Every evening | ORAL | Status: DC | PRN
Start: 2021-07-31 — End: 2021-08-01
  Administered 2021-07-31: 0.5 mg via ORAL
  Filled 2021-07-31: qty 1

## 2021-07-31 MED ORDER — FLEET ENEMA 7-19 GM/118ML RE ENEM: 1.0000 | ENEMA | Freq: Every day | RECTAL | Status: DC | PRN

## 2021-07-31 NOTE — Progress Notes (Signed)
? ?[facility] ?PROGRESS NOTE ? ? ?Angela Medina:[id number] DOB: 29-Apr-1950 DOA: 07/21/2021  9 ?DOS: the patient was seen and examined on 07/31/2021 ? ?PCP: Patient, No Pcp Per (Inactive) ? ?Brief History and Hospital Course:  ?71 y.o. female with PMH significant for homelessness, rheumatoid arthritis, schizoaffective disorder, depression, chronic impairment of mobility, smokes a pack of cigarette every day. She presented to the ED on 07/21/2021 with progressively worsening weakness, numbness, tingling in her arms and legs as well as inability to walk for months. MRI of the cervical spine significant for degenerative spondylolysis at C3-C4 with severe spinal stenosis and evidence of cord compression. 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  Neurosurgery was consulted.  Patient was offered surgical management which she refused. ? ?Consultants: Neurosurgery.  Psychiatry ? ?Procedures: None ? ? ? ?Subjective: ?Patient complains of foot constipation.  This is despite being on multiple different medications.  Also mention that she is having difficulty sleeping at night.  Denies any new complaints.   ? ? ?Assessment/Plan: ? ? ?* Cervical spondylosis with myelopathy ?-MRI cervical spine showed severe stenosis at C3-C4 level due to degenerative anterolisthesis and significant kyphosis.   ?-Neurosurgery was consulted.    Discussion was held with patient regarding surgical intervention by neurosurgery as well as previous rounding MDs.  She declined surgery.  Even though psychiatry said patient may not have capacity to decide regarding surgery neurosurgery did not feel that there was an emergent indication to force her to undergo surgical intervention. ?Symptomatic treatment including pain medications. ?Neurological status seems to be stable. ? ?Skin infection ?Noted in abdominal area as well as upper leg.  Concern for cellulitis.  Completed 5-day course of Rocephin.   ? ?Schizoaffective disorder (HCC) ?Continue  olanzapine.  Seen by psychiatry.   ? ?Arthritis ?Patient has significant ulnar deviation with nodules of the bilateral hands as well as nodules on her feet. ? ?Debility ?Seen by PT and OT.  Skilled nursing facility is recommended for rehabilitation ? ?Tobacco abuse ?Counseled. ? ?Insomnia ?Started on melatonin and as needed alprazolam. ? ?Constipation ?Remains constipated.  Bowel regimen was adjusted yesterday.  TSH was noted to be normal in February. ?Remains on MiraLAX, Senokot.  Received 3 days of Dulcolax suppositories.  May need to use enema.   ? ? ? ?DVT Prophylaxis: Lovenox ?Code Status: Full code ?Family Communication: Discussed with patient.  No family at bedside ?Disposition Plan: SNF recommended ? ?Status is: Inpatient ?Remains inpatient appropriate because: Difficult placement ? ? ? ? ?Medications: Scheduled: ? bisacodyl  10 mg Rectal Daily  ? clobetasol ointment   Topical BID  ? enoxaparin (LOVENOX) injection  40 mg Subcutaneous Q24H  ? gabapentin  100 mg Oral TID  ? leptospermum manuka honey  1 application. Topical Daily  ? melatonin  5 mg Oral q1800  ? nicotine  21 mg Transdermal Daily  ? OLANZapine  10 mg Oral QHS  ? polyethylene glycol  17 g Oral BID  ? saccharomyces boulardii  250 mg Oral BID  ? senna-docusate  2 tablet Oral BID  ? sodium chloride flush  3 mL Intravenous Q12H  ? ?Continuous: ?WJX:BJYNWGNFAOZHY **OR** acetaminophen, albuterol, ALPRAZolam, diclofenac Sodium, diphenhydrAMINE, HYDROcodone-acetaminophen, ondansetron **OR** ondansetron (ZOFRAN) IV ? ?Antibiotics: ?Anti-infectives (From admission, onward)  ? ? Start     Dose/Rate Route Frequency Ordered Stop  ? 07/26/21 1500  cefTRIAXone (ROCEPHIN) 1 g in sodium chloride 0.9 % 100 mL IVPB       ? 1 g ?200 mL/hr over 30  Minutes Intravenous Every 24 hours 07/26/21 1250 07/26/21 1534  ? 07/22/21 1500  cefTRIAXone (ROCEPHIN) 1 g in sodium chloride 0.9 % 100 mL IVPB  Status:  Discontinued       ? 1 g ?200 mL/hr over 30 Minutes Intravenous  Every 24 hours 07/22/21 1403 07/26/21 1250  ? ?  ? ? ?Objective: ? ?Vital Signs ? ?Vitals:  ? 07/30/21 0816 07/30/21 1619 07/30/21 2024 07/31/21 0741  ?BP: (!) 164/113 114/70 (!) 153/97 (!) 153/87  ?Pulse: 60 67 71 62  ?Resp: 18 16 16 15   ?Temp: 98.1 ?F (36.7 ?C) 98.6 ?F (37 ?C) 98.3 ?F (36.8 ?C) 97.7 ?F (36.5 ?C)  ?TempSrc: Oral Oral Oral Oral  ?SpO2: 96% 94% 92% 93%  ?Weight:      ?Height:      ? ? ?Intake/Output Summary (Last 24 hours) at 07/31/2021 1039 ?Last data filed at 07/31/2021 0900 ?Gross per 24 hour  ?Intake 840 ml  ?Output 2350 ml  ?Net -1510 ml  ? ? ?Filed Weights  ? 07/22/21 1309  ?Weight: 62.4 kg  ? ? ?General appearance: Awake alert.  In no distress.  Mildly distracted. ?Resp: Clear to auscultation bilaterally.  Normal effort ?Cardio: S1-S2 is normal regular.  No S3-S4.  No rubs murmurs or bruit ?GI: Abdomen is soft.  Nontender nondistended.  Bowel sounds are present normal.  No masses organomegaly ?Extremities: No edema. ?Neurologic:  No focal neurological deficits.  ? ? ?Lab Results: ? ?Data Reviewed: I have personally reviewed labs and imaging study reports ? ?CBC: ?Recent Labs  ?Lab 07/31/21 ?[id number]  ?WBC 6.0  ?HGB 12.1  ?HCT 37.4  ?MCV 98.2  ?PLT 280  ? ? ? ?Basic Metabolic Panel: ?Recent Labs  ?Lab 07/31/21 ?[id number]  ?NA 137  ?K 3.5  ?CL 102  ?CO2 29  ?GLUCOSE 104*  ?BUN 23  ?CREATININE 0.57  ?CALCIUM 8.4*  ? ? ? ?GFR: ?Estimated Creatinine Clearance: 55.2 mL/min (by C-G formula based on SCr of 0.57 mg/dL). ? ? ?Radiology Studies: ?No results found. ? ? ? ? LOS: 9 days  ? ?Osvaldo ShipperGokul Neeraj Housand ? ?[facility] ?Pager on [email or web address] ? ?07/31/2021, 10:39 AM ? ? ?

## 2021-07-31 NOTE — Progress Notes (Addendum)
Occupational Therapy Treatment ?Patient Details ?Name: Angela Medina ?MRN: [id number] ?DOB: 1951-04-12 ?Today's Date: 07/31/2021 ? ? ?History of present illness Pt is a 71 y.o. F who presents 07/21/2021 with progressively worsening weakness, numbness, tingling in arms and legs as well as inability to walk for months. MRI cervical spine showed severe stenosis at C3-C4 level due to degenerative anterolisthesis and significant kyphosis.  There is also severe degenerative changes throughout with moderate to severe foraminal narrowing and C4-C8.  There is cord signal change at C3-C4 reaching near the C2-C3 disc space. Neurosurgery recommending surgery and pt declining. Significant PMH: RA, schizoaffective disorder, depression. ?  ?OT comments ? Pt progressing towards goals, requiring mod A +2 for bed mobility to sit EOB and rolling R/L for pad change. Pt set up - mod A for UB ADLs, pt very fearful of standing transferring despite encouragement, stating she "doesn't want to fall". Pt max A +2 for lateral scoot transfer toward HOB. Pt presenting with impairments listed below, will follow acutely. Continue to recommend SNF at d/c.  ? ?Recommendations for follow up therapy are one component of a multi-disciplinary discharge planning process, led by the attending physician.  Recommendations may be updated based on patient status, additional functional criteria and insurance authorization. ?   ?Follow Up Recommendations ? Skilled nursing-short term rehab (<3 hours/day)  ?  ?Assistance Recommended at Discharge Intermittent Supervision/Assistance  ?Patient can return home with the following ? Two people to help with walking and/or transfers;Two people to help with bathing/dressing/bathroom;Assistance with cooking/housework;Direct supervision/assist for medications management;Direct supervision/assist for financial management;Assist for transportation;Help with stairs or ramp for entrance ?  ?Equipment Recommendations ? None  recommended by OT;Other (comment) (defer to next venue of care)  ?  ?Recommendations for Other Services   ? ?  ?Precautions / Restrictions Precautions ?Precautions: Fall ?Restrictions ?Weight Bearing Restrictions: No  ? ? ?  ? ?Mobility Bed Mobility ?Overal bed mobility: Needs Assistance ?Bed Mobility: Supine to Sit, Sit to Supine ?Rolling: Mod assist, +2 for physical assistance, Total assist ?  ?Supine to sit: Mod assist, +2 for physical assistance, Total assist ?Sit to supine: Mod assist, +2 for physical assistance, Total assist ?  ?General bed mobility comments: total assist to being BLE's to/off and back into bed, mod a to elevate trunk, total assist to manage LE during rolling ?  ? ?Transfers ?Overall transfer level: Needs assistance ?  ?Transfers: Bed to chair/wheelchair/BSC ?  ?  ?  ?  ?  ? Lateral/Scoot Transfers: Max assist, +2 physical assistance ?General transfer comment: able to sit EOB for >10 mins, able to lateral scoot with max A +2 toward HOB ?  ?  ?Balance Overall balance assessment: Needs assistance ?Sitting-balance support: Single extremity supported ?Sitting balance-Leahy Scale: Fair ?Sitting balance - Comments: trunk flexed ?  ?  ?  ?  ?  ?  ?  ?  ?  ?  ?  ?  ?  ?  ?  ?   ? ?ADL either performed or assessed with clinical judgement  ? ?ADL Overall ADL's : Needs assistance/impaired ?Eating/Feeding: With adaptive utensils;Bed level ?Eating/Feeding Details (indicate cue type and reason): requires containers opened/items poured ?Grooming: Wash/dry face;Sitting ?Grooming Details (indicate cue type and reason): completed sitting EOB ?Upper Body Bathing: Maximal assistance;Sitting ?Upper Body Bathing Details (indicate cue type and reason): to wash  back ?  ?  ?Upper Body Dressing : Moderate assistance;Bed level ?Upper Body Dressing Details (indicate cue type and reason): to don fresh gown ?  ?  ?  ?  ?  ?  ?  ?  ?  ?  ?  ? ?  Extremity/Trunk Assessment Upper Extremity Assessment ?Upper Extremity  Assessment: RUE deficits/detail ?RUE Deficits / Details: ~90 degrees shoulder flexion ROM, 2/5 grasp strength ?LUE Deficits / Details: ~30 degrees shoulder flexion ROM, 2/5 grasp strength ?  ?Lower Extremity Assessment ?Lower Extremity Assessment: Defer to PT evaluation ?  ?  ?  ? ?Vision   ?Vision Assessment?: No apparent visual deficits ?  ?Perception Perception ?Perception: Not tested ?  ?Praxis Praxis ?Praxis: Not tested ?  ? ?Cognition Arousal/Alertness: Awake/alert ?Behavior During Therapy: [facility] for tasks assessed/performed ?Overall Cognitive Status: History of cognitive impairments - at baseline ?  ?  ?  ?  ?  ?  ?  ?  ?  ?  ?  ?  ?  ?  ?  ?  ?General Comments: Psych hx. ?  ?  ?   ?Exercises   ? ?  ?Shoulder Instructions   ? ? ?  ?General Comments VSS on RA, RN notified of blister on L side of pt's back  ? ? ?Pertinent Vitals/ Pain       Pain Assessment ?Pain Assessment: Faces ?Pain Location: BLE ?Pain Descriptors / Indicators: Discomfort, Spasm, Moaning ?Pain Intervention(s): Limited activity within patient's tolerance, Monitored during session, Repositioned ? ?Home Living   ?  ?  ?  ?  ?  ?  ?  ?  ?  ?  ?  ?  ?  ?  ?  ?  ?  ?  ? ?  ?Prior Functioning/Environment    ?  ?  ?  ?   ? ?Frequency ? Min 2X/week  ? ? ? ? ?  ?Progress Toward Goals ? ?OT Goals(current goals can now be found in the care plan section) ? Progress towards OT goals: Progressing toward goals ? ?Acute Rehab OT Goals ?Patient Stated Goal: none stated ?OT Goal Formulation: With patient ?Time For Goal Achievement: 08/08/21 ?Potential to Achieve Goals: Fair  ?Plan Discharge plan remains appropriate;Frequency remains appropriate   ? ?Co-evaluation ? ? ? PT/OT/SLP Co-Evaluation/Treatment: Yes ?Reason for Co-Treatment: Complexity of the patient's impairments (multi-system involvement);Necessary to address cognition/behavior during functional activity;To address functional/ADL transfers ?PT goals addressed during session: Mobility/safety with  mobility;Balance;Proper use of DME;Strengthening/ROM ?OT goals addressed during session: ADL's and self-care ?  ? ?  ?AM-PAC OT "6 Clicks" Daily Activity     ?Outcome Measure ? ? Help from another person eating meals?: A Little ?Help from another person taking care of personal grooming?: A Little ?Help from another person toileting, which includes using toliet, bedpan, or urinal?: Total ?Help from another person bathing (including washing, rinsing, drying)?: A Lot ?Help from another person to put on and taking off regular upper body clothing?: A Lot ?Help from another person to put on and taking off regular lower body clothing?: Total ?6 Click Score: 12 ? ?  ?End of Session   ? ?OT Visit Diagnosis: Muscle weakness (generalized) (M62.81) ?  ?Activity Tolerance Patient limited by pain;Treatment limited secondary to agitation ?  ?Patient Left in bed;with call bell/phone within reach;with bed alarm set ?  ?Nurse Communication Mobility status ?  ? ?   ? ?Time: [id number] ?OT Time Calculation (min): 23 min ? ?Charges: OT General Charges ?$OT Visit: 1 Visit ?OT Treatments ?$Self Care/Home Management : 8-22 mins ? ?Alfonzo BeersNatalie Loveta Dellis, OTD, OTR/L ?Acute Rehab ?[phone] ? ? ?Mayer MaskerNatalie M Xinyi Batton ?07/31/2021, 4:03 PM ?

## 2021-07-31 NOTE — TOC Progression Note (Addendum)
Transition of Care (TOC) - Progression Note  ? ? ?Patient Details  ?Name: Angela Medina  ?MRN: [id number] ?Date of Birth: 03/10/1951 ? ?Transition of Care (TOC) CM/SW Contact  ?Lorri FrederickWierda, Zarria Towell Jon, LCSW ?Phone Number: ?07/31/2021, 8:49 AM ? ?Clinical Narrative:  PASSR face to face evaluation still pending in  [place].   Pt [facility], CSW spoke with [facility], and this offer has been rescinded. ?TOC will continue to follow.  ? ? ? ?Expected Discharge Plan: Skilled Nursing Facility ?Barriers to Discharge: SNF Pending bed offer, Other (must enter comment) (homeless, mental health needs) ? ?Expected Discharge Plan and Services ?Expected Discharge Plan: Skilled Nursing Facility ?In-house Referral: Clinical Social Work ?  ?Post Acute Care Choice: Skilled Nursing Facility ?Living arrangements for the past 2 months: Homeless ?                ?  ?  ?  ?  ?  ?  ?  ?  ?  ?  ? ? ?Social Determinants of Health (SDOH) Interventions ?  ? ?Readmission Risk Interventions ?   ? View : No data to display.  ?  ?  ?  ? ? ?

## 2021-07-31 NOTE — Progress Notes (Signed)
Physical Therapy Treatment ?Patient Details ?Name: Angela CombesRebecca S Belgarde ?MRN: [id number] ?DOB: 09/05/1950 ?Today's Date: 07/31/2021 ? ? ?History of Present Illness Pt is a 71 y.o. F who presents 07/21/2021 with progressively worsening weakness, numbness, tingling in arms and legs as well as inability to walk for months. MRI cervical spine showed severe stenosis at C3-C4 level due to degenerative anterolisthesis and significant kyphosis.  There is also severe degenerative changes throughout with moderate to severe foraminal narrowing and C4-C8.  There is cord signal change at C3-C4 reaching near the C2-C3 disc space. Neurosurgery recommending surgery and pt declining. Significant PMH: RA, schizoaffective disorder, depression. ? ?  ?PT Comments  ? ? Pt received supine and agreeable to PT/OT session. Pt with good participation today with much improved attitude and motivation.  Pt needing mod-total a +2 to for all bed mobility for BLE management and trunk control. Once sitting EOB pt able to intermittently maintain without assist and reach outside BOS, pt with occasional posterior/anterior lean requiring min assist to correct. Pt with fair tolerance for LE therex for increased strength and ROM. Semi-circular patch of blistering noted on L mid back of pt, RN notified and examined during session. Pt continues to benefit from skilled PT services to progress toward functional mobility goals.  ?  ?Recommendations for follow up therapy are one component of a multi-disciplinary discharge planning process, led by the attending physician.  Recommendations may be updated based on patient status, additional functional criteria and insurance authorization. ? ?Follow Up Recommendations ? Skilled nursing-short term rehab (<3 hours/day) ?  ?  ?Assistance Recommended at Discharge Frequent or constant Supervision/Assistance  ?Patient can return home with the following A lot of help with walking and/or transfers;A lot of help with  bathing/dressing/bathroom ?  ?Equipment Recommendations ? Other (comment) (TBA)  ?  ?Recommendations for Other Services   ? ? ?  ?Precautions / Restrictions Precautions ?Precautions: Fall ?Restrictions ?Weight Bearing Restrictions: No  ?  ? ?Mobility ? Bed Mobility ?Overal bed mobility: Needs Assistance ?Bed Mobility: Supine to Sit, Sit to Supine ?Rolling: Mod assist, +2 for physical assistance, Total assist ?  ?Supine to sit: Mod assist, +2 for physical assistance, Total assist ?Sit to supine: Mod assist, +2 for physical assistance, Total assist ?  ?General bed mobility comments: total assist to being BLE's to/off and back into bed, mod a to elevate trunk, total assist to manage LE during rolling ?  ? ?Transfers ?  ?  ?  ?  ?  ?  ?  ?  ?  ?General transfer comment: able to sit EOB for >10 mins ?  ? ?Ambulation/Gait ?  ?  ?  ?  ?  ?  ?  ?General Gait Details: unable ? ? ?Stairs ?  ?  ?  ?  ?  ? ? ?Wheelchair Mobility ?  ? ?Modified Rankin (Stroke Patients Only) ?  ? ? ?  ?Balance Overall balance assessment: Needs assistance ?Sitting-balance support: Single extremity supported ?Sitting balance-Leahy Scale: Fair ?Sitting balance - Comments: very flexed posture ?  ?  ?  ?  ?  ?  ?  ?  ?  ?  ?  ?  ?  ?  ?  ?  ? ?  ?Cognition Arousal/Alertness: Awake/alert ?Behavior During Therapy: [facility] CenterWFL for tasks assessed/performed ?Overall Cognitive Status: History of cognitive impairments - at baseline ?  ?  ?  ?  ?  ?  ?  ?  ?  ?  ?  ?  ?  ?  ?  ?  ?  General Comments: Psych hx. ?  ?  ? ?  ?Exercises General Exercises - Lower Extremity ?Ankle Circles/Pumps: AROM, Right, Left, 10 reps ?Long Arc Quad: AROM, Right, Left, 10 reps ?Hip Flexion/Marching: AROM, Right, 10 reps ?Other Exercises ?Other Exercises: reaching outside BOS x10 ea side, while seated EOB. ? ?  ?General Comments General comments (skin integrity, edema, etc.): VSS on RA, semi-circular blister noted on L mid back, RN notified and examined while pt seated EOB ?  ?   ? ?Pertinent Vitals/Pain Pain Assessment ?Pain Assessment: Faces ?Faces Pain Scale: Hurts little more ?Pain Location: BLE ?Pain Descriptors / Indicators: Discomfort, Spasm, Moaning ?Pain Intervention(s): Limited activity within patient's tolerance, Monitored during session, Repositioned  ? ? ?Home Living   ?  ?  ?  ?  ?  ?  ?  ?  ?  ?   ?  ?Prior Function    ?  ?  ?   ? ?PT Goals (current goals can now be found in the care plan section) Acute Rehab PT Goals ?PT Goal Formulation: With patient ?Time For Goal Achievement: 08/07/21 ? ?  ?Frequency ? ? ? Min 2X/week ? ? ? ?  ?PT Plan    ? ? ?Co-evaluation PT/OT/SLP Co-Evaluation/Treatment: Yes ?Reason for Co-Treatment: Complexity of the patient's impairments (multi-system involvement);Necessary to address cognition/behavior during functional activity;For patient/therapist safety ?PT goals addressed during session: Mobility/safety with mobility;Balance;Proper use of DME;Strengthening/ROM ?  ?  ? ?  ?AM-PAC PT "6 Clicks" Mobility   ?Outcome Measure ? Help needed turning from your back to your side while in a flat bed without using bedrails?: A Lot ?Help needed moving from lying on your back to sitting on the side of a flat bed without using bedrails?: Total ?Help needed moving to and from a bed to a chair (including a wheelchair)?: Total ?Help needed standing up from a chair using your arms (e.g., wheelchair or bedside chair)?: Total ?Help needed to walk in hospital room?: Total ?Help needed climbing 3-5 steps with a railing? : Total ?6 Click Score: 7 ? ?  ?End of Session   ?Activity Tolerance: Patient limited by pain ?Patient left: in bed;with call bell/phone within reach;with bed alarm set ?Nurse Communication: Mobility status (blisters on back) ?PT Visit Diagnosis: Other abnormalities of gait and mobility (R26.89);Muscle weakness (generalized) (M62.81) ?  ? ? ?Time: 1610-9604 ?PT Time Calculation (min) (ACUTE ONLY): 24 min ? ?Charges:  $Therapeutic Activity: 8-22  mins          ?          ?Lenora Boys. PTA ?Acute Rehabilitation Services ?Office: [phone] ? ? ?Marlana Salvage Printice Hellmer ?07/31/2021, 2:52 PM ? ?

## 2021-08-01 DIAGNOSIS — M4712 Other spondylosis with myelopathy, cervical region: Secondary | ICD-10-CM | POA: Diagnosis not present

## 2021-08-01 DIAGNOSIS — K5900 Constipation, unspecified: Secondary | ICD-10-CM | POA: Diagnosis not present

## 2021-08-01 MED ORDER — AMLODIPINE BESYLATE 2.5 MG PO TABS: 2.5000 mg | ORAL_TABLET | Freq: Every day | ORAL | Status: AC

## 2021-08-01 MED ORDER — CLOBETASOL PROPIONATE 0.05 % EX OINT: TOPICAL_OINTMENT | Freq: Two times a day (BID) | CUTANEOUS | 0 refills | Status: AC

## 2021-08-01 MED ORDER — AMLODIPINE BESYLATE 5 MG PO TABS: 5.0000 mg | ORAL_TABLET | Freq: Every day | ORAL | Status: DC

## 2021-08-01 MED ORDER — ALPRAZOLAM 0.5 MG PO TABS: 0.5000 mg | ORAL_TABLET | Freq: Every evening | ORAL | 0 refills | Status: DC | PRN

## 2021-08-01 MED ORDER — SACCHAROMYCES BOULARDII 250 MG PO CAPS
250.0000 mg | ORAL_CAPSULE | Freq: Two times a day (BID) | ORAL | 0 refills | Status: AC
Start: 2021-08-01 — End: 2021-08-15

## 2021-08-01 MED ORDER — MELATONIN 5 MG PO TABS
5.0000 mg | ORAL_TABLET | Freq: Every day | ORAL | 0 refills | Status: AC
Start: 2021-08-01 — End: ?

## 2021-08-01 MED ORDER — ALBUTEROL SULFATE (2.5 MG/3ML) 0.083% IN NEBU: 2.5000 mg | INHALATION_SOLUTION | Freq: Four times a day (QID) | RESPIRATORY_TRACT | 12 refills | Status: AC | PRN

## 2021-08-01 MED ORDER — ALPRAZOLAM 0.5 MG PO TABS
0.5000 mg | ORAL_TABLET | Freq: Every evening | ORAL | Status: DC | PRN
  Administered 2021-08-01 – 2021-08-02 (×2): 0.5 mg via ORAL
  Filled 2021-08-01 (×2): qty 1

## 2021-08-01 MED ORDER — OLANZAPINE 10 MG PO TABS
10.0000 mg | ORAL_TABLET | Freq: Every day | ORAL | 0 refills | Status: DC
Start: 2021-08-01 — End: 2021-10-28

## 2021-08-01 MED ORDER — AMLODIPINE BESYLATE 2.5 MG PO TABS
2.5000 mg | ORAL_TABLET | Freq: Every day | ORAL | Status: DC
  Administered 2021-08-02: 2.5 mg via ORAL
  Filled 2021-08-01 (×2): qty 1

## 2021-08-01 MED ORDER — SENNOSIDES-DOCUSATE SODIUM 8.6-50 MG PO TABS: 2.0000 | ORAL_TABLET | Freq: Two times a day (BID) | ORAL | Status: AC

## 2021-08-01 MED ORDER — GABAPENTIN 100 MG PO CAPS: 100.0000 mg | ORAL_CAPSULE | Freq: Three times a day (TID) | ORAL | Status: AC

## 2021-08-01 MED ORDER — HYDROCODONE-ACETAMINOPHEN 5-325 MG PO TABS: 1.0000 | ORAL_TABLET | Freq: Four times a day (QID) | ORAL | 0 refills | Status: DC | PRN

## 2021-08-01 MED ORDER — POLYETHYLENE GLYCOL 3350 17 G PO PACK: 17.0000 g | PACK | Freq: Every day | ORAL | 0 refills | Status: AC

## 2021-08-01 MED ORDER — DICLOFENAC SODIUM 1 % EX GEL: 2.0000 g | Freq: Three times a day (TID) | CUTANEOUS | Status: AC | PRN

## 2021-08-01 NOTE — Progress Notes (Signed)
? ?[facility] ?PROGRESS NOTE ? ? ?Angela Medina [id number] DOB: 1951-04-12 DOA: 07/21/2021  10 ?DOS: the patient was seen and examined on 08/01/2021 ? ?PCP: Patient, No Pcp Per (Inactive) ? ?Brief History and Hospital Course:  ?71 y.o. female with PMH significant for homelessness, rheumatoid arthritis, schizoaffective disorder, depression, chronic impairment of mobility, smokes a pack of cigarette every day. She presented to the ED on 07/21/2021 with progressively worsening weakness, numbness, tingling in her arms and legs as well as inability to walk for months. MRI of the cervical spine significant for degenerative spondylolysis at C3-C4 with severe spinal stenosis and evidence of cord compression. 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  Neurosurgery was consulted.  Patient was offered surgical management which she refused. ? ?Consultants: Neurosurgery.  Psychiatry ? ?Procedures: None ? ? ? ?Subjective: ?Had multiple bowel movements yesterday.  Denies any new complaints.  Requesting change back to alprazolam from Klonopin.   ? ? ?Assessment/Plan: ? ? ?* Cervical spondylosis with myelopathy ?-MRI cervical spine showed severe stenosis at C3-C4 level due to degenerative anterolisthesis and significant kyphosis.   ?-Neurosurgery was consulted.    Discussion was held with patient regarding surgical intervention by neurosurgery as well as previous rounding MDs.  She declined surgery.  Even though psychiatry said patient may not have capacity to decide regarding surgery neurosurgery did not feel that there was an emergent indication to force her to undergo surgical intervention. ?Symptomatic treatment including pain medications. ?Neurological status seems to be stable. ? ?Skin infection ?Noted in abdominal area as well as upper leg.  Concern for cellulitis.  Completed 5-day course of Rocephin.   ? ?Schizoaffective disorder ([place]) ?Continue olanzapine.  Seen by psychiatry.   ? 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 ? ?Debility ?Seen by PT and OT.  Skilled nursing facility is recommended for rehabilitation ? ?Tobacco abuse ?Counseled. ? ?Insomnia ?Started on melatonin.  Was on alprazolam which was switched over to Klonopin yesterday which she did not tolerate well.  We will switch her back to alprazolam. ? ?Constipation ?She was constipated for several days despite aggressive bowel regimen.  Subsequently had bowel movement yesterday.   ?TSH was normal in February.   ?Continue MiraLAX and Senokot. ?May need to decrease the dose if she experiences very frequent bowel movements. ? ? ? ?DVT Prophylaxis: Lovenox ?Code Status: Full code ?Family Communication: Discussed with patient.  No family at bedside ?Disposition Plan: Waiting on placement to SNF. ? ?Status is: Inpatient ?Remains inpatient appropriate because: Difficult placement ? ? ? ? ?Medications: Scheduled: ? amLODipine  5 mg Oral Daily  ? clobetasol ointment   Topical BID  ? enoxaparin (LOVENOX) injection  40 mg Subcutaneous Q24H  ? gabapentin  100 mg Oral TID  ? leptospermum manuka honey  1 application. Topical Daily  ? melatonin  5 mg Oral q1800  ? nicotine  21 mg Transdermal Daily  ? OLANZapine  10 mg Oral QHS  ? polyethylene glycol  17 g Oral BID  ? saccharomyces boulardii  250 mg Oral BID  ? senna-docusate  2 tablet Oral BID  ? sodium chloride flush  3 mL Intravenous Q12H  ? ?Continuous: ?[id number] **OR** acetaminophen, albuterol, ALPRAZolam, diclofenac Sodium, diphenhydrAMINE, HYDROcodone-acetaminophen, ondansetron **OR** ondansetron (ZOFRAN) IV, sodium phosphate ? ?Antibiotics: ?Anti-infectives (From admission, onward)  ? ? Start     Dose/Rate Route Frequency Ordered Stop  ? 07/26/21 1500  cefTRIAXone (ROCEPHIN) 1 g in  sodium chloride 0.9 % 100 mL IVPB       ? 1 g ?200 mL/hr over 30 Minutes Intravenous Every 24 hours 07/26/21 1250 07/26/21 1534  ? 07/22/21 1500   cefTRIAXone (ROCEPHIN) 1 g in sodium chloride 0.9 % 100 mL IVPB  Status:  Discontinued       ? 1 g ?200 mL/hr over 30 Minutes Intravenous Every 24 hours 07/22/21 1403 07/26/21 1250  ? ?  ? ? ?Objective: ? ?Vital Signs ? ?Vitals:  ? 07/31/21 1500 07/31/21 2005 08/01/21 0432 08/01/21 0738  ?BP: (!) 145/93 (!) 171/96 (!) 185/98 127/77  ?Pulse: 68 63 63 (!) 56  ?Resp: 19 18 16 17   ?Temp: 98 ?F (36.7 ?C) 98.6 ?F (37 ?C) 98.1 ?F (36.7 ?C) 98.4 ?F (36.9 ?C)  ?TempSrc: Oral Oral  Oral  ?SpO2: 95% 94% 93% 93%  ?Weight:      ?Height:      ? ? ?Intake/Output Summary (Last 24 hours) at 08/01/2021 0940 ?Last data filed at 07/31/2021 1600 ?Gross per 24 hour  ?Intake 240 ml  ?Output 300 ml  ?Net -60 ml  ? ? ?Filed Weights  ? 07/22/21 1309  ?Weight: 62.4 kg  ? ? ?General appearance: Awake alert.  In no distress ?Resp: Clear to auscultation bilaterally.  Normal effort ?Cardio: S1-S2 is normal regular.  No S3-S4.  No rubs murmurs or bruit ?GI: Abdomen is soft.  Nontender nondistended.  Bowel sounds are present normal.  No masses organomegaly ? ? ?Lab Results: ? ?Data Reviewed: I have personally reviewed labs and imaging study reports ? ?CBC: ?Recent Labs  ?Lab 07/31/21 ?[id number]  ?WBC 6.0  ?HGB 12.1  ?HCT 37.4  ?MCV 98.2  ?PLT 280  ? ? ? ?Basic Metabolic Panel: ?Recent Labs  ?Lab 07/31/21 ?[id number]  ?NA 137  ?K 3.5  ?CL 102  ?CO2 29  ?GLUCOSE 104*  ?BUN 23  ?CREATININE 0.57  ?CALCIUM 8.4*  ? ? ? ?GFR: ?Estimated Creatinine Clearance: 55.2 mL/min (by C-G formula based on SCr of 0.57 mg/dL). ? ? ?Radiology Studies: ?No results found. ? ? ? ? LOS: 10 days  ? ?Angela Medina ? ?[facility] ?Pager on [email or web address] ? ?08/01/2021, 9:40 AM ? ? ?

## 2021-08-01 NOTE — TOC Progression Note (Addendum)
Transition of Care (TOC) - Progression Note  ? ? ?Patient Details  ?Name: Angela Medina ?MRN: [id number] ?Date of Birth: [birth year] ? ?Transition of Care (TOC) CM/SW Contact  ?Joanne Chars, LCSW ?Phone Number: ?08/01/2021, 1:30 PM ? ?Clinical Narrative:   passr now in [place]: [id number]. ? ?No SNF bed offers.   ? ?CSW reached out to the following facilities who have not responded to referral in the hub: ?[facility] ?[facility]-no beds, per [place], but would consider once one opens up ?[facility] ?[facility] ?[facility]-will [facility] ?[facility]-spoke to [place] and she would consider pt if she was willing to transition to LTC ?[facility] ? ?CSW spoke with [place] with [facility]--she is planning to come by and get medicaid application signed and submitted. ? ? ?Expected Discharge Plan: [place] ?Barriers to Discharge: SNF Pending bed offer, Other (must enter comment) (homeless, mental health needs) ? ?Expected Discharge Plan and Services ?Expected Discharge Plan: [place] ?In-house Referral: Clinical Social Work ?  ?Post Acute Care Choice: [place] ?Living arrangements for the past 2 months: Homeless ?                ?  ?  ?  ?  ?  ?  ?  ?  ?  ?  ? ? ?Social Determinants of Health (SDOH) Interventions ?  ? ?Readmission Risk Interventions ?   ? View : No data to display.  ?  ?  ?  ? ? ?

## 2021-08-01 NOTE — Progress Notes (Signed)
Pt refused her Lovenox this am. MD is notified.  ?

## 2021-08-01 NOTE — Plan of Care (Signed)
  Problem: Pain Managment: Goal: General experience of comfort will improve Outcome: Progressing   Problem: Safety: Goal: Ability to remain free from injury will improve Outcome: Progressing   

## 2021-08-02 DIAGNOSIS — M4712 Other spondylosis with myelopathy, cervical region: Secondary | ICD-10-CM | POA: Diagnosis not present

## 2021-08-02 DIAGNOSIS — F259 Schizoaffective disorder, unspecified: Secondary | ICD-10-CM | POA: Diagnosis not present

## 2021-08-02 MED ORDER — METHOCARBAMOL 500 MG PO TABS
500.0000 mg | ORAL_TABLET | Freq: Three times a day (TID) | ORAL | Status: AC | PRN
Start: 2021-08-02 — End: ?

## 2021-08-02 MED ORDER — METHOCARBAMOL 500 MG PO TABS
500.0000 mg | ORAL_TABLET | Freq: Three times a day (TID) | ORAL | Status: DC | PRN
  Administered 2021-08-02 – 2021-08-03 (×2): 500 mg via ORAL
  Filled 2021-08-02 (×2): qty 1

## 2021-08-02 NOTE — Progress Notes (Signed)
Occupational Therapy Treatment ?Patient Details ?Name: Angela Medina ?MRN: [id number] ?DOB: Jan 20, 1951 ?Today's Date: 08/02/2021 ? ? ?History of present illness Pt is a 71 y.o. F who presents 07/21/2021 with progressively worsening weakness, numbness, tingling in arms and legs as well as inability to walk for months. MRI cervical spine showed severe stenosis at C3-C4 level due to degenerative anterolisthesis and significant kyphosis.  There is also severe degenerative changes throughout with moderate to severe foraminal narrowing and C4-C8.  There is cord signal change at C3-C4 reaching near the C2-C3 disc space. Neurosurgery recommending surgery and pt declining. Significant PMH: RA, schizoaffective disorder, depression. ?  ?OT comments ? Pt completed self feeding with set up, hand washing with min assistance and then required encouragement to complete dressing of UE with min to moderate assistance. Pt completed rolling but only agreed to the L side with moderate assistance. Pt then reported that was enough for the day. Pt currently with functional limitations due to the deficits listed below (see OT Problem List).  Pt will benefit from skilled OT to increase their safety and independence with ADL and functional mobility for ADL to facilitate discharge to venue listed below.  ?  ? ?Recommendations for follow up therapy are one component of a multi-disciplinary discharge planning process, led by the attending physician.  Recommendations may be updated based on patient status, additional functional criteria and insurance authorization. ?   ?Follow Up Recommendations ? Skilled nursing-short term rehab (<3 hours/day)  ?  ?Assistance Recommended at Discharge Intermittent Supervision/Assistance  ?Patient can return home with the following ? Two people to help with walking and/or transfers;Two people to help with bathing/dressing/bathroom;Assistance with cooking/housework;Direct supervision/assist for medications  management;Direct supervision/assist for financial management;Assist for transportation;Help with stairs or ramp for entrance ?  ?Equipment Recommendations ? None recommended by OT;Other (comment)  ?  ?Recommendations for Other Services   ? ?  ?Precautions / Restrictions Precautions ?Precautions: Fall ?Restrictions ?Weight Bearing Restrictions: No  ? ? ?  ? ?Mobility Bed Mobility ?Overal bed mobility: Needs Assistance ?  ?Rolling: Min assist (rolled to L side but decline to R side but was bgled towards direction at the start of session) ?  ?  ?  ?  ?  ?  ? ?Transfers ?  ?  ?  ?  ?  ?  ?  ?  ?  ?General transfer comment: pt decline any further movement beside some rolling ?  ?  ?Balance   ?  ?  ?  ?  ?  ?  ?  ?  ?  ?  ?  ?  ?  ?  ?  ?  ?  ?  ?   ? ?ADL either performed or assessed with clinical judgement  ? ?ADL Overall ADL's : Needs assistance/impaired ?Eating/Feeding: Minimal assistance (with adaptive/enlarged utentsils) ?Eating/Feeding Details (indicate cue type and reason): requires containers opened/items poured ?Grooming: Wash/dry face;Sitting ?  ?Upper Body Bathing: Maximal assistance;Sitting ?  ?Lower Body Bathing: Maximal assistance;Bed level ?  ?Upper Body Dressing : Maximal assistance;Bed level ?  ?Lower Body Dressing: Maximal assistance;Bed level ?  ?  ?  ?  ?  ?  ?  ?  ?  ?  ? ?Extremity/Trunk Assessment Upper Extremity Assessment ?Upper Extremity Assessment: RUE deficits/detail;LUE deficits/detail ?RUE Deficits / Details: ~90 degrees shoulder flexion ROM, 2/5 grasp strength ?RUE Coordination: decreased fine motor;decreased gross motor ?LUE Deficits / Details: ~30 degrees shoulder flexion ROM, 2/5 grasp strength ?LUE Sensation: WNL ?LUE Coordination:  decreased fine motor;decreased gross motor ?  ?Lower Extremity Assessment ?Lower Extremity Assessment: Defer to PT evaluation ?  ?  ?  ? ?Vision   ?  ?  ?Perception   ?  ?Praxis   ?  ? ?Cognition Arousal/Alertness: Awake/alert ?[facility]: [facility]  for tasks assessed/performed ?Overall Cognitive Status: History of cognitive impairments - at baseline ?  ?  ?  ?  ?  ?  ?  ?  ?  ?  ?  ?  ?  ?  ?  ?  ?General Comments: Psych hx. ?  ?  ?   ?Exercises   ? ?  ?Shoulder Instructions   ? ? ?  ?General Comments    ? ? ?Pertinent Vitals/ Pain       Pain Assessment ?Pain Assessment: Faces ?Faces Pain Scale: Hurts little more ?Facial Expression: Relaxed, neutral ?Body Movements: Absence of movements ?Muscle Tension: Relaxed ?Compliance with ventilator (intubated pts.): N/A ?Vocalization (extubated pts.): N/A ?CPOT Total: 0 ?Pain Location: BLE ?Pain Descriptors / Indicators: Discomfort, Spasm, Moaning ?Pain Intervention(s): Limited activity within patient's tolerance, Monitored during session ? ?Home Living   ?  ?  ?  ?  ?  ?  ?  ?  ?  ?  ?  ?  ?  ?  ?  ?  ?  ?  ? ?  ?Prior Functioning/Environment    ?  ?  ?  ?   ? ?Frequency ? Min 2X/week  ? ? ? ? ?  ?Progress Toward Goals ? ?OT Goals(current goals can now be found in the care plan section) ? Progress towards OT goals: Progressing toward goals ? ?Acute Rehab OT Goals ?Patient Stated Goal: to have more to drink ?OT Goal Formulation: With patient ?Time For Goal Achievement: 08/08/21 ?Potential to Achieve Goals: Fair ?ADL Goals ?Pt Will Perform Eating: with set-up;with adaptive utensils;sitting;bed level ?Pt Will Perform Grooming: with set-up;sitting ?Pt Will Perform Upper Body Bathing: with set-up;sitting ?Pt Will Perform Lower Body Bathing: with set-up;sit to/from stand;bed level ?Pt Will Transfer to Toilet: with set-up;ambulating  ?Plan Discharge plan remains appropriate;Frequency remains appropriate   ? ?Co-evaluation ? ? ?   ?  ?  ?  ?  ? ?  ?AM-PAC OT "6 Clicks" Daily Activity     ?Outcome Measure ? ? Help from another person eating meals?: A Little ?Help from another person taking care of personal grooming?: A Little ?Help from another person toileting, which includes using toliet, bedpan, or urinal?: Total ?Help from  another person bathing (including washing, rinsing, drying)?: A Lot ?Help from another person to put on and taking off regular upper body clothing?: A Lot ?Help from another person to put on and taking off regular lower body clothing?: Total ?6 Click Score: 12 ? ?  ?End of Session   ? ?OT Visit Diagnosis: Muscle weakness (generalized) (M62.81) ?  ?Activity Tolerance Patient limited by pain ?  ?Patient Left in bed;with call bell/phone within reach;with bed alarm set ?  ?Nurse Communication Mobility status (PO intake needs) ?  ? ?   ? ?Time: [id number] ?OT Time Calculation (min): 37 min ? ?Charges: OT General Charges ?$OT Visit: 1 Visit ?OT Treatments ?$Self Care/Home Management : 23-37 mins ? ?Alphia MohKira Nicholaus Steinke OTR/L  ?Acute Rehab Services  ?[phone] office number ?[phone] pager number ? ? ?Alphia MohKira  Bless Lisenby ?08/02/2021, 12:12 PM ?

## 2021-08-02 NOTE — Progress Notes (Signed)
? ?[facility] ?PROGRESS NOTE ? ? ?Jacqualin CombesRebecca S Herbison ZOX:[id number] DOB: 06/26/1950 DOA: 07/21/2021  11 ?DOS: the patient was seen and examined on 08/02/2021 ? ?PCP: Patient, No Pcp Per (Inactive) ? ?Brief History and Hospital Course:  ?71 y.o. female with PMH significant for homelessness, rheumatoid arthritis, schizoaffective disorder, depression, chronic impairment of mobility, smokes a pack of cigarette every day. She presented to the ED on 07/21/2021 with progressively worsening weakness, numbness, tingling in her arms and legs as well as inability to walk for months. MRI of the cervical spine significant for degenerative spondylolysis at C3-C4 with severe spinal stenosis and evidence of cord compression. 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  Neurosurgery was consulted.  Patient was offered surgical management which she refused.  Manage conservatively.  Neurologically she has been stable. ? ?Consultants: Neurosurgery.  Psychiatry ? ?Procedures: None ? ? ? ?Subjective: ?Complains of muscle spasms today.  No other complaints offered.   ? ? ?Assessment/Plan: ? ? ?* Cervical spondylosis with myelopathy ?-MRI cervical spine showed severe stenosis at C3-C4 level due to degenerative anterolisthesis and significant kyphosis.   ?-Neurosurgery was consulted.    Discussion was held with patient regarding surgical intervention by neurosurgery as well as previous rounding MDs.  She declined surgery.  Even though psychiatry said patient may not have capacity to decide regarding surgery neurosurgery did not feel that there was an emergent indication to force her to undergo surgical intervention. ?Symptomatic treatment including pain medications. ?Neurological status seems to be stable. ? ?Skin infection ?Noted in abdominal area as well as upper leg.  Concern for cellulitis.  Completed 5-day course of Rocephin.   ? ?Schizoaffective disorder (HCC) ?Continue olanzapine.  Seen by psychiatry.   ? 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 ?Reports muscle spasms.  Will prescribe Robaxin. ? ?Debility ?Seen by PT and OT.  Skilled nursing facility is recommended for rehabilitation ? ?Tobacco abuse ?Counseled. ? ?Insomnia ?Continue melatonin.  Alprazolam as needed. ? ?Constipation ?She was constipated for several days despite aggressive bowel regimen.  TSH was normal in February.   ?Continue MiraLAX and Senokot. ?As per charting she has had multiple bowel movements.  Abdomen remains benign. ? ? ? ?DVT Prophylaxis: Lovenox ?Code Status: Full code ?Family Communication: Discussed with patient.  No family at bedside ?Disposition Plan: Waiting on placement to SNF. ? ?Status is: Inpatient ?Remains inpatient appropriate because: Difficult placement ? ? ? ? ?Medications: Scheduled: ? amLODipine  2.5 mg Oral Daily  ? clobetasol ointment   Topical BID  ? enoxaparin (LOVENOX) injection  40 mg Subcutaneous Q24H  ? gabapentin  100 mg Oral TID  ? leptospermum manuka honey  1 application. Topical Daily  ? melatonin  5 mg Oral q1800  ? nicotine  21 mg Transdermal Daily  ? OLANZapine  10 mg Oral QHS  ? polyethylene glycol  17 g Oral BID  ? saccharomyces boulardii  250 mg Oral BID  ? senna-docusate  2 tablet Oral BID  ? sodium chloride flush  3 mL Intravenous Q12H  ? ?Continuous: ?WJX:BJYNWGNFAOZHYPRN:acetaminophen **OR** acetaminophen, albuterol, ALPRAZolam, diclofenac Sodium, diphenhydrAMINE, HYDROcodone-acetaminophen, methocarbamol, ondansetron **OR** ondansetron (ZOFRAN) IV, sodium phosphate ? ?Antibiotics: ?Anti-infectives (From admission, onward)  ? ? Start     Dose/Rate Route Frequency Ordered Stop  ? 07/26/21 1500  cefTRIAXone (ROCEPHIN) 1 g in sodium chloride 0.9 % 100 mL IVPB       ? 1 g ?200 mL/hr over 30  Minutes Intravenous Every 24 hours 07/26/21 1250 07/26/21 1534  ? 07/22/21 1500  cefTRIAXone (ROCEPHIN) 1 g in sodium chloride 0.9 % 100 mL IVPB  Status:  Discontinued       ? 1  g ?200 mL/hr over 30 Minutes Intravenous Every 24 hours 07/22/21 1403 07/26/21 1250  ? ?  ? ? ?Objective: ? ?Vital Signs ? ?Vitals:  ? 08/01/21 0738 08/01/21 1022 08/01/21 1950 08/02/21 0736  ?BP: 127/77 111/69 (!) 146/81 119/69  ?Pulse: (!) 56  66 74  ?Resp: ?Temp: 98.4 ?F (36.9 ?C)  98.5 ?F (36.9 ?C) 98.1 ?F (36.7 ?C)  ?TempSrc: Oral  Oral Oral  ?SpO2: 93%  95% 94%  ?Weight:      ?Height:      ? ? ?Intake/Output Summary (Last 24 hours) at 08/02/2021 0939 ?Last data filed at 08/02/2021 0900 ?Gross per 24 hour  ?Intake 1560 ml  ?Output --  ?Net 1560 ml  ? ? ?Filed Weights  ? 07/22/21 1309  ?Weight: 62.4 kg  ? ? ?General appearance: Somnolent but easily arousable ?Resp: Clear to auscultation bilaterally.  Normal effort ?Cardio: S1-S2 is normal regular.  No S3-S4.  No rubs murmurs or bruit ?GI: Abdomen is soft.  Nontender nondistended.  Bowel sounds are present normal.  No masses organomegaly ?Neurologic:   No focal neurological deficits.  ? ? ? ?Lab Results: ? ?Data Reviewed: I have personally reviewed labs and imaging study reports ? ?CBC: ?Recent Labs  ?Lab 07/31/21 ?1610  ?WBC 6.0  ?HGB 12.1  ?HCT 37.4  ?MCV 98.2  ?PLT 280  ? ? ? ?Basic Metabolic Panel: ?Recent Labs  ?Lab 07/31/21 ?9604  ?NA 137  ?K 3.5  ?CL 102  ?CO2 29  ?GLUCOSE 104*  ?BUN 23  ?CREATININE 0.57  ?CALCIUM 8.4*  ? ? ? ?GFR: ?Estimated Creatinine Clearance: 55.2 mL/min (by C-G formula based on SCr of 0.57 mg/dL). ? ? ?Radiology Studies: ?No results found. ? ? ? ? LOS: 11 days  ? ?Osvaldo Shipper ? ?[facility] ?Pager on [email or web address] ? ?08/02/2021, 9:39 AM ? ? ?

## 2021-08-02 NOTE — TOC Progression Note (Addendum)
Transition of Care (TOC) - Progression Note  ? ? ?Patient Details  ?Name: Angela Medina ?MRN: [id number] ?Date of Birth: 12/08/1950 ? ?Transition of Care (TOC) CM/SW Contact  ?Lorri FrederickWierda, Ahsha Hinsley Jon, LCSW ?Phone Number: ?08/02/2021, 1:41 PM ? ?Clinical Narrative:   CSW spoke with Gardenia PhlegmLakita, [place] APS and updated her that financial counseling had submitted medicaid application in March.  Charlcie CradleLakita determined that this was in [place]--pt currently has medicaid that pays her medicare premium only.  Her medicaid worker there is Cristal GenerousMelanie Armstrong, [phone].  Per Shawna OrleansMelanie, pt may be eligible for long term care medicaid but would need FL2 submitted after she is admitted to a facility. ? ?[facility], both can take pt into LTC after rehab.  CSW spoke with pt and she would [facility].  ? ? ?Expected Discharge Plan: Skilled Nursing Facility ?Barriers to Discharge: SNF Pending bed offer, Other (must enter comment) (homeless, mental health needs) ? ?Expected Discharge Plan and Services ?Expected Discharge Plan: Skilled Nursing Facility ?In-house Referral: Clinical Social Work ?  ?Post Acute Care Choice: Skilled Nursing Facility ?Living arrangements for the past 2 months: Homeless ?                ?  ?  ?  ?  ?  ?  ?  ?  ?  ?  ? ? ?Social Determinants of Health (SDOH) Interventions ?  ? ?Readmission Risk Interventions ?   ? View : No data to display.  ?  ?  ?  ? ? ?

## 2021-08-03 DIAGNOSIS — M4712 Other spondylosis with myelopathy, cervical region: Secondary | ICD-10-CM | POA: Diagnosis not present

## 2021-08-03 LAB — BASIC METABOLIC PANEL
Anion gap: 6 (ref 5–15)
BUN: 35 mg/dL — ABNORMAL HIGH (ref 8–23)
CO2: 27 mmol/L (ref 22–32)
Calcium: 8.4 mg/dL — ABNORMAL LOW (ref 8.9–10.3)
Chloride: 102 mmol/L (ref 98–111)
Creatinine, Ser: 0.62 mg/dL (ref 0.44–1.00)
GFR, Estimated: >60 mL/min (ref >60)
Glucose, Bld: 106 mg/dL — ABNORMAL HIGH (ref 70–99)
Potassium: 3.8 mmol/L (ref 3.5–5.1)
Sodium: 135 mmol/L (ref 135–145)

## 2021-08-03 LAB — MAGNESIUM: Magnesium: 2 mg/dL (ref 1.7–2.4)

## 2021-08-03 NOTE — Plan of Care (Signed)
Adequate for discharge.

## 2021-08-03 NOTE — TOC Transition Note (Addendum)
Transition of Care (TOC) - CM/SW Discharge Note ? ? ?Patient Details  ?Name: Angela CombesRebecca S  ?MRN: [id number] ?Date of Birth: 10/08/1950 ? ?Transition of Care [facility](TOC) CM/SW Contact:  ?Lorri FrederickWierda, Davyn Morandi Jon, LCSW ?Phone Number: ?08/03/2021, 9:53 AM ? ? ?Clinical Narrative:   [facility].  RN call report to [phone].  ? ?APS worker Charlcie CradleLakita also notified.  ? ?Final next level of care: Skilled Nursing Facility ?Barriers to Discharge: Barriers Resolved ? ? ?Patient Goals and CMS Choice ?Patient states their goals for this hospitalization and ongoing recovery are:: "survive everyday" ?CMS Medicare.gov Compare Post Acute Care list provided to:: Patient ?Choice offered to / list presented to : Patient ? ?Discharge Placement ?  ?           ?Patient chooses bed at:  [facility] ?Patient to be transferred to facility by: [facility] ?Name of family member notified: none ?Patient and family notified of of transfer: 08/03/21 ? ?Discharge Plan and Services ?In-house Referral: Clinical Social Work ?  ?Post Acute Care Choice: Skilled Nursing Facility          ?  ?  ?  ?  ?  ?  ?  ?  ?  ?  ? ?Social Determinants of Health (SDOH) Interventions ?  ? ? ?Readmission Risk Interventions ?   ? View : No data to display.  ?  ?  ?  ? ? ? ? ? ?

## 2021-08-03 NOTE — Discharge Summary (Signed)
?[facility] ? ?Physician Discharge Summary  ? ?Patient ID: ?Angela Medina ?MRN: [id number] ?DOB/AGE: Jun 12, 1950 71 y.o. ? ?Admit date: 07/21/2021 ?Discharge date:   08/03/2021 ? ? ?PCP: Patient, No Pcp Per (Inactive) ? ?DISCHARGE DIAGNOSES:  ?Principal Problem: ?  Cervical spondylosis with myelopathy ?Active Problems: ?  Skin infection ?  Schizoaffective disorder ([place]) ?  Arthritis ?  Debility ?  Tobacco abuse ?  Constipation ?  Insomnia ? ? ?RECOMMENDATIONS FOR OUTPATIENT FOLLOW UP: ?Check CBC and basic metabolic panel in 1 week ?Continue bowel regimen ? ? ? ?Home Health: Going to SNF ?Equipment/Devices: None ? ?CODE STATUS: Full code ? ?DISCHARGE CONDITION: fair ? ?Diet recommendation: Dysphagia 3 diet with thin liquids.  Aspiration precautions ? ? ? ?INITIAL HISTORY: ?71 y.o. female with PMH significant for homelessness, rheumatoid arthritis, schizoaffective disorder, depression, chronic impairment of mobility, smokes a pack of cigarette every day. She presented to the ED on 07/21/2021 with progressively worsening weakness, numbness, tingling in her arms and legs as well as inability to walk for months. MRI of the cervical spine significant for degenerative spondylolysis at C3-C4 with severe spinal stenosis and evidence of cord compression. 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  Neurosurgery was consulted.  Patient was offered surgical management which she refused.  Manage conservatively.  Neurologically she has been stable. ? ?Consultations: ?Neurosurgery ?Psychiatry ? ?Procedures: ?None ? ? ?HOSPITAL COURSE:  ? ?* Cervical spondylosis with myelopathy ?-MRI cervical spine showed severe stenosis at C3-C4 level due to degenerative anterolisthesis and significant kyphosis.   ?-Neurosurgery was consulted.    Discussion was held with patient regarding surgical intervention by neurosurgery as well as previous rounding MDs.  She declined surgery.  Even though psychiatry said patient may not have capacity to  decide regarding surgery neurosurgery did not feel that there was an emergent indication to force her to undergo surgical intervention. ?Symptomatic treatment including pain medications. ?Neurological status seems to be stable.  Skilled nursing facility for rehab was recommended.  She has a bed available today. ? ?Skin infection ?Noted in abdominal area as well as upper leg.  Concern for cellulitis.  Completed 5-day course of Rocephin.   ?Continue daily wound care. ? ?Schizoaffective disorder ([place]) ?Continue olanzapine.  ? ?Arthritis ?Patient has significant ulnar deviation with nodules of the bilateral hands as well as nodules on her feet. ?Robaxin for muscle spasms. ? ?Debility ?Seen by PT and OT.  Skilled nursing facility is recommended for rehabilitation ? ?Tobacco abuse ?Counseled. ? ?Insomnia ?Continue melatonin.  Alprazolam as needed. ? ?Constipation ?She was constipated for several days despite aggressive bowel regimen.  TSH was normal in February.   ?Finally she had multiple bowel movements.  Continue MiraLAX and Senokot.  Abdomen remains benign. ? ? ?Patient is stable.  Reluctant to go to SNF today.  Tried to reassure her but she got upset very quickly.  She does have underlying psychiatric condition.  May not be fully comprehending.  Was seen by psychiatry during the early part of this admission in which she was thought not to have capacity to make certain decisions.  She remains medically stable and okay for discharge to SNF. ? ? ?PERTINENT LABS: ? ?The results of significant diagnostics from this hospitalization (including imaging, microbiology, ancillary and laboratory) are listed below for reference.   ? ? ?Labs: ? ?COVID-19 Labs ? ?Lab Results  ?Component Value Date  ? [place] NEGATIVE 07/18/2021  ? [place] NEGATIVE 07/06/2021  ? [place] NEGATIVE 06/17/2021  ? [place] NEGATIVE 05/01/2021  ? ? ? ? ?  Basic Metabolic Panel: ?Recent Labs  ?Lab 07/31/21 ?[id number] 08/03/21 ?0246  ?NA 137 135  ?K  3.5 3.8  ?CL 102 102  ?CO2 29 27  ?GLUCOSE 104* 106*  ?BUN 23 35*  ?CREATININE 0.57 0.62  ?CALCIUM 8.4* 8.4*  ?MG  --  2.0  ? ? ?CBC: ?Recent Labs  ?Lab 07/31/21 ?[id number]  ?WBC 6.0  ?HGB 12.1  ?HCT 37.4  ?MCV 98.2  ?PLT 280  ? ? ? ? ?IMAGING STUDIES ?DG Chest 1 View ? ?Result Date: 07/18/2021 ?CLINICAL DATA:  [place] with right hip pain EXAM: CHEST  1 VIEW COMPARISON:  07/13/2021 FINDINGS: Chronic elevation of the left hemidiaphragm. Left ventricular prominence. Atherosclerosis and tortuosity of the aorta. The lungs are clear. No effusion. Chronic degenerative changes affect the spine. IMPRESSION: Active disease.  Chronic findings as above. Electronically Signed   By: Nelson Chimes M.D.   On: 07/18/2021 10:03  ? ?DG Chest 2 View ? ?Result Date: 07/13/2021 ?CLINICAL DATA:  71 year old female with a history of pain EXAM: CHEST - 2 VIEW COMPARISON:  Multiple prior most recent 07/06/2021 FINDINGS: Cardiomediastinal silhouette unchanged in size and contour. No interlobular septal thickening. No central vascular congestion. No pneumothorax or pleural effusion. No new confluent airspace disease. Similar appearance of mid/lower thoracic compression fracture, with osteopenia. No displaced rib fracture identified IMPRESSION: Negative for acute cardiopulmonary disease. Redemonstration of mid/lower thoracic compression fracture Electronically Signed   By: Corrie Mckusick D.O.   On: 07/13/2021 09:59  ? ?DG Chest 2 View ? ?Result Date: 07/06/2021 ?CLINICAL DATA:  Status post fall. EXAM: CHEST - 2 VIEW COMPARISON:  July 04, 2021 FINDINGS: The heart size and mediastinal contours are within normal limits. There is mild elevation of the left hemidiaphragm. Low lung volumes are noted. Both lungs are clear. Degenerative changes are seen involving the bilateral shoulders and thoracic spine. IMPRESSION: Low lung volumes without active cardiopulmonary disease. Electronically Signed   By: Virgina Norfolk M.D.   On: 07/06/2021 17:30  ? ?DG Lumbar  Spine Complete ? ?Result Date: 07/13/2021 ?CLINICAL DATA:  72 year old female with a history of fall EXAM: LUMBAR SPINE - COMPLETE 4+ VIEW COMPARISON:  None. FINDINGS: Osteopenia. Right apex scoliotic curvature of the lumbar spine. No available comparison. Oblique images demonstrate no displaced pars defect. Vertebral body heights maintained. No acute displaced fracture identified. Disc disease present throughout the lumbar spine with endplate changes and anterior osteophyte production. Facet hypertrophy worst in the lower lumbar levels. Vascular calcifications IMPRESSION: No radiographic evidence of acute fracture of the lumbar spine. Multilevel degenerative changes and rightward scoliotic curvature Electronically Signed   By: Corrie Mckusick D.O.   On: 07/13/2021 09:55  ? ?DG Wrist Complete Left ? ?Result Date: 07/10/2021 ?CLINICAL DATA:  fall EXAM: LEFT HAND - COMPLETE 3+ VIEW; LEFT WRIST - COMPLETE 3+ VIEW COMPARISON:  None. FINDINGS: Left hand: There is no evidence of fracture or dislocation. First carpometacarpal moderate to severe degenerative changes. Interphalangeal joint degenerative changes. Soft tissues are unremarkable. Left wrist: No evidence of fracture, dislocation, or joint effusion. No evidence of severe arthropathy. No aggressive appearing focal bone abnormality. Soft tissues are unremarkable. IMPRESSION: No acute displaced fracture or dislocation of the left hand and wrist. Electronically Signed   By: Iven Finn M.D.   On: 07/10/2021 20:48  ? ?CT HEAD WO CONTRAST (5MM) ? ?Result Date: 07/17/2021 ?CLINICAL DATA:  Dizziness EXAM: CT HEAD WITHOUT CONTRAST TECHNIQUE: Contiguous axial images were obtained from the base of the skull through the vertex  without intravenous contrast. RADIATION DOSE REDUCTION: This exam was performed according to the departmental dose-optimization program which includes automated exposure control, adjustment of the mA and/or kV according to patient size and/or use of  iterative reconstruction technique. COMPARISON:  Head CT dated July 13, 2021 FINDINGS: Brain: Chronic white matter ischemic change. No evidence of acute infarction, hemorrhage, hydrocephalus, extra-axial c

## 2021-08-03 NOTE — Progress Notes (Signed)
This nurse called [facility] [phone] to give report. There was no answer, Left Voicemail and will call back for second attempt.  ?

## 2021-09-03 ENCOUNTER — Emergency Department (HOSPITAL_COMMUNITY): Payer: Medicare Other

## 2021-09-03 ENCOUNTER — Other Ambulatory Visit: Payer: Self-pay

## 2021-09-03 ENCOUNTER — Emergency Department (HOSPITAL_COMMUNITY)
Admission: EM | Admit: 2021-09-03 | Discharge: 2021-09-04 | Disposition: A | Discharge status: Home / Self Care | Payer: Medicare Other | Attending: Emergency Medicine | Admitting: Emergency Medicine

## 2021-09-03 ENCOUNTER — Encounter (HOSPITAL_COMMUNITY): Payer: Self-pay

## 2021-09-03 DIAGNOSIS — R531 Weakness: Secondary | ICD-10-CM | POA: Diagnosis not present

## 2021-09-03 DIAGNOSIS — M545 Low back pain, unspecified: Secondary | ICD-10-CM | POA: Insufficient documentation

## 2021-09-03 DIAGNOSIS — G8929 Other chronic pain: Secondary | ICD-10-CM | POA: Insufficient documentation

## 2021-09-03 DIAGNOSIS — R200 Anesthesia of skin: Secondary | ICD-10-CM | POA: Diagnosis not present

## 2021-09-03 MED ORDER — IBUPROFEN 200 MG PO TABS
600.0000 mg | ORAL_TABLET | Freq: Once | ORAL | Status: AC
  Administered 2021-09-03: 600 mg via ORAL
  Filled 2021-09-03: qty 1
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
## 2021-09-04 MED ORDER — OLANZAPINE 10 MG PO TABS
10.0000 mg | ORAL_TABLET | Freq: Every day | ORAL | Status: DC
Start: 2021-09-04 — End: 2021-09-04
  Administered 2021-09-04: 10 mg via ORAL
  Filled 2021-09-04: qty 1

## 2021-09-04 MED ORDER — ALPRAZOLAM 0.25 MG PO TABS
0.5000 mg | ORAL_TABLET | Freq: Every evening | ORAL | Status: DC | PRN
  Administered 2021-09-04: 0.5 mg via ORAL
  Filled 2021-09-04: qty 2

## 2021-09-04 NOTE — ED Notes (Signed)
Attempted report to [facility] without success. ?
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
## 2021-09-04 NOTE — ED Notes (Signed)
[facility] called 

## 2021-09-04 NOTE — Discharge Instructions (Signed)
You were seen today for back pain.  Your CT scan does not show any evidence of any new injury.  Continue your medications at home. ?

## 2021-09-04 NOTE — ED Notes (Addendum)
Pt Dc'd back to [facility] via [facility]. Paperwork provided ?

## 2021-10-25 ENCOUNTER — Emergency Department (HOSPITAL_COMMUNITY): Payer: Medicare Other

## 2021-10-25 ENCOUNTER — Inpatient Hospital Stay (HOSPITAL_COMMUNITY)
Admission: EM | Admit: 2021-10-25 | Discharge: 2021-10-28 | DRG: 683 | Disposition: A | Discharge status: Skilled Nursing Facility | Payer: Medicare Other | Source: Skilled Nursing Facility | Attending: Internal Medicine | Admitting: Internal Medicine

## 2021-10-25 ENCOUNTER — Encounter (HOSPITAL_COMMUNITY): Payer: Self-pay | Admitting: *Deleted

## 2021-10-25 ENCOUNTER — Other Ambulatory Visit: Payer: Self-pay

## 2021-10-25 DIAGNOSIS — M4712 Other spondylosis with myelopathy, cervical region: Secondary | ICD-10-CM | POA: Diagnosis present

## 2021-10-25 DIAGNOSIS — Z993 Dependence on wheelchair: Secondary | ICD-10-CM | POA: Diagnosis not present

## 2021-10-25 DIAGNOSIS — I10 Essential (primary) hypertension: Secondary | ICD-10-CM | POA: Diagnosis present

## 2021-10-25 DIAGNOSIS — N139 Obstructive and reflux uropathy, unspecified: Secondary | ICD-10-CM | POA: Diagnosis not present

## 2021-10-25 DIAGNOSIS — Z66 Do not resuscitate: Secondary | ICD-10-CM | POA: Diagnosis present

## 2021-10-25 DIAGNOSIS — Z79899 Other long term (current) drug therapy: Secondary | ICD-10-CM

## 2021-10-25 DIAGNOSIS — I160 Hypertensive urgency: Secondary | ICD-10-CM | POA: Diagnosis present

## 2021-10-25 DIAGNOSIS — F259 Schizoaffective disorder, unspecified: Secondary | ICD-10-CM | POA: Diagnosis present

## 2021-10-25 DIAGNOSIS — E875 Hyperkalemia: Secondary | ICD-10-CM | POA: Diagnosis present

## 2021-10-25 DIAGNOSIS — Z880 Allergy status to penicillin: Secondary | ICD-10-CM | POA: Diagnosis not present

## 2021-10-25 DIAGNOSIS — R339 Retention of urine, unspecified: Principal | ICD-10-CM

## 2021-10-25 DIAGNOSIS — M542 Cervicalgia: Secondary | ICD-10-CM

## 2021-10-25 DIAGNOSIS — L89316 Pressure-induced deep tissue damage of right buttock: Secondary | ICD-10-CM | POA: Diagnosis present

## 2021-10-25 DIAGNOSIS — F17210 Nicotine dependence, cigarettes, uncomplicated: Secondary | ICD-10-CM | POA: Diagnosis present

## 2021-10-25 DIAGNOSIS — N1330 Unspecified hydronephrosis: Secondary | ICD-10-CM | POA: Diagnosis present

## 2021-10-25 DIAGNOSIS — N320 Bladder-neck obstruction: Secondary | ICD-10-CM | POA: Diagnosis present

## 2021-10-25 DIAGNOSIS — Z888 Allergy status to other drugs, medicaments and biological substances status: Secondary | ICD-10-CM | POA: Diagnosis not present

## 2021-10-25 DIAGNOSIS — F419 Anxiety disorder, unspecified: Secondary | ICD-10-CM | POA: Diagnosis present

## 2021-10-25 DIAGNOSIS — N179 Acute kidney failure, unspecified: Secondary | ICD-10-CM | POA: Diagnosis present

## 2021-10-25 DIAGNOSIS — M4802 Spinal stenosis, cervical region: Secondary | ICD-10-CM | POA: Diagnosis present

## 2021-10-25 DIAGNOSIS — K429 Umbilical hernia without obstruction or gangrene: Secondary | ICD-10-CM | POA: Diagnosis present

## 2021-10-25 HISTORY — DX: Insomnia, unspecified: G47.00

## 2021-10-25 HISTORY — DX: Dysphagia, unspecified: R13.10

## 2021-10-25 LAB — COMPREHENSIVE METABOLIC PANEL
ALT: 14 U/L (ref 0–44)
AST: 21 U/L (ref 15–41)
Albumin: 3.7 g/dL (ref 3.5–5.0)
Alkaline Phosphatase: 65 U/L (ref 38–126)
Anion gap: 13 (ref 5–15)
BUN: 71 mg/dL — ABNORMAL HIGH (ref 8–23)
CO2: 28 mmol/L (ref 22–32)
Calcium: 9 mg/dL (ref 8.9–10.3)
Chloride: 91 mmol/L — ABNORMAL LOW (ref 98–111)
Creatinine, Ser: 2.26 mg/dL — ABNORMAL HIGH (ref 0.44–1.00)
GFR, Estimated: 23 mL/min — ABNORMAL LOW (ref >60)
Glucose, Bld: 121 mg/dL — ABNORMAL HIGH (ref 70–99)
Potassium: 5.7 mmol/L — ABNORMAL HIGH (ref 3.5–5.1)
Sodium: 132 mmol/L — ABNORMAL LOW (ref 135–145)
Total Bilirubin: 1.2 mg/dL (ref 0.3–1.2)
Total Protein: 7.3 g/dL (ref 6.5–8.1)

## 2021-10-25 LAB — CBC WITH DIFFERENTIAL/PLATELET
Abs Immature Granulocytes: 0.03 10*3/uL (ref 0.00–0.07)
Basophils Absolute: 0.1 10*3/uL (ref 0.0–0.1)
Basophils Relative: 1 %
Eosinophils Absolute: 0.4 10*3/uL (ref 0.0–0.5)
Eosinophils Relative: 4 %
HCT: 38.5 % (ref 36.0–46.0)
Hemoglobin: 12.9 g/dL (ref 12.0–15.0)
Immature Granulocytes: 0 %
Lymphocytes Relative: 16 %
Lymphs Abs: 1.8 10*3/uL (ref 0.7–4.0)
MCH: 32.2 pg (ref 26.0–34.0)
MCHC: 33.5 g/dL (ref 30.0–36.0)
MCV: 96 fL (ref 80.0–100.0)
Monocytes Absolute: 1.7 10*3/uL — ABNORMAL HIGH (ref 0.1–1.0)
Monocytes Relative: 15 %
Neutro Abs: 7.3 10*3/uL (ref 1.7–7.7)
Neutrophils Relative %: 64 %
Platelets: 331 10*3/uL (ref 150–400)
RBC: 4.01 MIL/uL (ref 3.87–5.11)
RDW: 13.6 % (ref 11.5–15.5)
WBC: 11.2 10*3/uL — ABNORMAL HIGH (ref 4.0–10.5)
nRBC: 0 % (ref 0.0–0.2)

## 2021-10-25 LAB — URINALYSIS, ROUTINE W REFLEX MICROSCOPIC
Bacteria, UA: NONE SEEN
Bilirubin Urine: NEGATIVE
Glucose, UA: NEGATIVE mg/dL
Ketones, ur: NEGATIVE mg/dL
Leukocytes,Ua: NEGATIVE
Nitrite: NEGATIVE
Protein, ur: NEGATIVE mg/dL
Specific Gravity, Urine: 1.011 (ref 1.005–1.030)
pH: 5 (ref 5.0–8.0)

## 2021-10-25 LAB — LIPASE, BLOOD: Lipase: 29 U/L (ref 11–51)

## 2021-10-25 MED ORDER — SODIUM ZIRCONIUM CYCLOSILICATE 10 G PO PACK
10.0000 g | PACK | Freq: Once | ORAL | Status: AC
  Administered 2021-10-25: 10 g via ORAL
  Filled 2021-10-25: qty 1

## 2021-10-25 NOTE — Consult Note (Addendum)
Urology Consult   Physician requesting consult: Malvin Johns, MD  Reason for consult: Urinary retention  History of Present Illness: Angela Medina is a 71 y.o. who presented the ED today with complaints of abdominal pain.  She notes that she has been unable to void for about 48 hours.  States that her abdomen is distended over the past 48 hours.  She denies nausea or emesis.  She has been constipated.  She denies fevers, chills, dysuria.  She does state that over the past several months, she has had some difficulty urinating.  She denies a history of recurrent urinary tract infections.  She thinks that she may have seen a urologist many years ago in [place]. CT A/P 10/25/2021 demonstrated significantly over distended bladder with bilateral hydronephrosis.  Bladder measures 24 cm in craniocaudad direction.  She does have a history of schizoaffective disorder and depression.  She denies prior urology history.    Past Medical History:  Diagnosis Date   Aneurysm ([place])    Depression    Dysphagia    Insomnia    Schizoaffective disorder ([place])    Vision changes    right eye    Past Surgical History:  Procedure Laterality Date   FOOT SURGERY       Current Hospital Medications:  Home meds:  No current facility-administered medications on file prior to encounter.   Current Outpatient Medications on File Prior to Encounter  Medication Sig Dispense Refill   acetaminophen (TYLENOL) 500 MG tablet Take 1,000 mg by mouth every 6 (six) hours as needed for moderate pain or headache.     albuterol (PROVENTIL) (2.5 MG/3ML) 0.083% nebulizer solution Take 3 mLs (2.5 mg total) by nebulization every 6 (six) hours as needed for wheezing or shortness of breath. 75 mL 12   ALPRAZolam (XANAX) 0.5 MG tablet Take 1 tablet (0.5 mg total) by mouth at bedtime as needed for anxiety. 20 tablet 0   amLODipine (NORVASC) 2.5 MG tablet Take 1 tablet (2.5 mg total) by mouth daily.     clobetasol ointment  (TEMOVATE) 0.05 % Apply topically 2 (two) times daily. 30 g 0   diclofenac Sodium (VOLTAREN) 1 % GEL Apply 2 g topically every 8 (eight) hours as needed (pain).     gabapentin (NEURONTIN) 100 MG capsule Take 1 capsule (100 mg total) by mouth 3 (three) times daily.     HYDROcodone-acetaminophen (NORCO/VICODIN) 5-325 MG tablet Take 1 tablet by mouth every 6 (six) hours as needed for moderate pain. 30 tablet 0   melatonin 5 MG TABS Take 1 tablet (5 mg total) by mouth daily at 6 PM. 30 tablet 0   methocarbamol (ROBAXIN) 500 MG tablet Take 1 tablet (500 mg total) by mouth every 8 (eight) hours as needed for muscle spasms.     OLANZapine (ZYPREXA) 10 MG tablet Take 1 tablet (10 mg total) by mouth at bedtime. 30 tablet 0   polyethylene glycol (MIRALAX / GLYCOLAX) 17 g packet Take 17 g by mouth daily. 14 each 0   senna-docusate (SENOKOT-S) 8.6-50 MG tablet Take 2 tablets by mouth 2 (two) times daily.       Scheduled Meds: Continuous Infusions: PRN Meds:.  Allergies:  Allergies  Allergen Reactions   Methotrexate Derivatives Other (See Comments)    Hair  Fall  out   Penicillins Other (See Comments)    Unknown- childhood allergy    No family history on file.  Social History:  reports that she has been smoking cigarettes. She  has never used smokeless tobacco. She reports current alcohol use. She reports that she does not use drugs.  ROS: A complete review of systems was performed.  All systems are negative except for pertinent findings as noted.  Physical Exam:  Vital signs in last 24 hours: Temp:  [98.5 F (36.9 C)] 98.5 F (36.9 C) (07/06 1327) Pulse Rate:  [71-93] 86 (07/06 2100) Resp:  [18-27] 25 (07/06 2100) BP: (137-166)/(98-119) 166/113 (07/06 2057) SpO2:  [92 %-100 %] 93 % (07/06 2100) Constitutional:  Alert and oriented, No acute distress Cardiovascular: Regular rate and rhythm Respiratory: Normal respiratory effort, Lungs clear bilaterally GI: Severely distended abdomen,  nontender. GU: Urethral meatus patent with no vaginal masses. Neurologic: Grossly intact, no focal deficits Psychiatric: Normal mood and affect  Laboratory Data:  Recent Labs    10/25/21 1443  WBC 11.2*  HGB 12.9  HCT 38.5  PLT 331    Recent Labs    10/25/21 1443  NA 132*  K 5.7*  CL 91*  GLUCOSE 121*  BUN 71*  CALCIUM 9.0  CREATININE 2.26*     Results for orders placed or performed during the hospital encounter of 10/25/21 (from the past 24 hour(s))  CBC with Differential     Status: Abnormal   Collection Time: 10/25/21  2:43 PM  Result Value Ref Range   WBC 11.2 (H) 4.0 - 10.5 K/uL   RBC 4.01 3.87 - 5.11 MIL/uL   Hemoglobin 12.9 12.0 - 15.0 g/dL   HCT 38.5 36.0 - 46.0 %   MCV 96.0 80.0 - 100.0 fL   MCH 32.2 26.0 - 34.0 pg   MCHC 33.5 30.0 - 36.0 g/dL   RDW 13.6 11.5 - 15.5 %   Platelets 331 150 - 400 K/uL   nRBC 0.0 0.0 - 0.2 %   Neutrophils Relative % 64 %   Neutro Abs 7.3 1.7 - 7.7 K/uL   Lymphocytes Relative 16 %   Lymphs Abs 1.8 0.7 - 4.0 K/uL   Monocytes Relative 15 %   Monocytes Absolute 1.7 (H) 0.1 - 1.0 K/uL   Eosinophils Relative 4 %   Eosinophils Absolute 0.4 0.0 - 0.5 K/uL   Basophils Relative 1 %   Basophils Absolute 0.1 0.0 - 0.1 K/uL   Immature Granulocytes 0 %   Abs Immature Granulocytes 0.03 0.00 - 0.07 K/uL  Comprehensive metabolic panel     Status: Abnormal   Collection Time: 10/25/21  2:43 PM  Result Value Ref Range   Sodium 132 (L) 135 - 145 mmol/L   Potassium 5.7 (H) 3.5 - 5.1 mmol/L   Chloride 91 (L) 98 - 111 mmol/L   CO2 28 22 - 32 mmol/L   Glucose, Bld 121 (H) 70 - 99 mg/dL   BUN 71 (H) 8 - 23 mg/dL   Creatinine, Ser 2.26 (H) 0.44 - 1.00 mg/dL   Calcium 9.0 8.9 - 10.3 mg/dL   Total Protein 7.3 6.5 - 8.1 g/dL   Albumin 3.7 3.5 - 5.0 g/dL   AST 21 15 - 41 U/L   ALT 14 0 - 44 U/L   Alkaline Phosphatase 65 38 - 126 U/L   Total Bilirubin 1.2 0.3 - 1.2 mg/dL   GFR, Estimated 23 (L) >60 mL/min   Anion gap 13 5 - 15  Lipase,  blood     Status: None   Collection Time: 10/25/21  2:43 PM  Result Value Ref Range   Lipase 29 11 - 51 U/L   No  results found for this or any previous visit (from the past 240 hour(s)).  Renal Function: Recent Labs    10/25/21 1443  CREATININE 2.26*   CrCl cannot be calculated (Unknown ideal weight.).  Radiologic Imaging: CT Thoracic Spine Wo Contrast  Result Date: 10/25/2021 CLINICAL DATA:  Neck and back pain.  Possible trauma EXAM: CT THORACIC SPINE WITHOUT CONTRAST TECHNIQUE: Multidetector CT images of the thoracic were obtained using the standard protocol without intravenous contrast. RADIATION DOSE REDUCTION: This exam was performed according to the departmental dose-optimization program which includes automated exposure control, adjustment of the mA and/or kV according to patient size and/or use of iterative reconstruction technique. COMPARISON:  CT chest 04/01/2021 FINDINGS: Alignment: Normal alignment. 30 degrees of kyphosis centered at the chronic T8 fracture. Vertebrae: Severe fracture of T8 is chronic and unchanged from the prior CT. There appears to be ankylosis of T7 through T9. No acute fracture or mass. Paraspinal and other soft tissues: No paraspinous mass. Coronary artery calcification Visualized lungs are clear. Disc levels: Advanced spondylosis in the cervical spine. Disc degeneration and mild spurring at T2-3 without stenosis. No spinal stenosis in the thoracic spine IMPRESSION: Severe chronic fracture of T8.  No acute thoracic fracture 30 degrees of kyphosis related to T8 fracture No significant thoracic spinal stenosis. Electronically Signed   By: Franchot Gallo M.D.   On: 10/25/2021 19:11   CT Abdomen Pelvis Wo Contrast  Result Date: 10/25/2021 CLINICAL DATA:  Acute abdominal pain with findings suggestive of constipation EXAM: CT ABDOMEN AND PELVIS WITHOUT CONTRAST TECHNIQUE: Multidetector CT imaging of the abdomen and pelvis was performed following the standard protocol  without IV contrast. RADIATION DOSE REDUCTION: This exam was performed according to the departmental dose-optimization program which includes automated exposure control, adjustment of the mA and/or kV according to patient size and/or use of iterative reconstruction technique. COMPARISON:  None Available. FINDINGS: Lower chest: Elevation of left hemidiaphragm is noted. A few scattered calcified granulomas are noted. Right basilar atelectasis is seen. Hepatobiliary: No focal liver abnormality is seen. No gallstones, gallbladder wall thickening, or biliary dilatation. Pancreas: Unremarkable. No pancreatic ductal dilatation or surrounding inflammatory changes. Spleen: Normal in size without focal abnormality. Adrenals/Urinary Tract: Adrenal glands are well visualized. A small 11 mm hypodense lesion is noted better visualized than on prior exams. It measures 1 Hounsfield unit. Kidneys are well visualized bilaterally. No renal calculi are seen. Bilateral hydronephrosis is noted secondary to a significantly over distended bladder. It measures approximately 24 cm in craniocaudad projection. Stomach/Bowel: Fecal material is noted throughout the colon and somewhat prominent in the rectal vault likely related to a degree of rectal impaction. The more proximal colon shows some fecal material within although no significant constipation is noted. The difficulty moving the patient's bowels may be related to the over distended bladder. The appendix is well visualized and within normal limits. Small bowel is displaced secondary to the distended bladder. No obstructive changes are seen. Stomach is within normal limits. Vascular/Lymphatic: Aortic atherosclerosis. No enlarged abdominal or pelvic lymph nodes. Reproductive: Uterus and bilateral adnexa are unremarkable. Other: No ascites is noted. Fat containing umbilical hernia is seen measuring 6.6 cm in greatest transverse dimension. Musculoskeletal: Degenerative changes of lumbar  spine are noted. Scoliosis concave to the left is noted. IMPRESSION: Significantly over distended bladder with associated bilateral hydronephrosis. Fat containing umbilical hernia. Findings suspicious for rectal impaction although no wall thickening is seen. The difficulty moving bowels may be related to the over distended bladder causing extrinsic compression  upon the colon. 11 mm left adrenal lesion is noted which measures 1 Hounsfield unit. 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 Electronically Signed   By: Inez Catalina M.D.   On: 10/25/2021 19:10   CT Head Wo Contrast  Result Date: 10/25/2021 CLINICAL DATA:  Acute neuro deficit. Neck and back pain. Possible trauma EXAM: CT HEAD WITHOUT CONTRAST CT CERVICAL SPINE WITHOUT CONTRAST TECHNIQUE: Multidetector CT imaging of the head and cervical spine was performed following the standard protocol without intravenous contrast. Multiplanar CT image reconstructions of the cervical spine were also generated. RADIATION DOSE REDUCTION: This exam was performed according to the departmental dose-optimization program which includes automated exposure control, adjustment of the mA and/or kV according to patient size and/or use of iterative reconstruction technique. COMPARISON:  CT head 07/17/2021 FINDINGS: CT HEAD FINDINGS Brain: Ventricle size normal. Patchy white matter hypodensity bilaterally consistent with chronic microvascular ischemia. Negative for acute infarct, hemorrhage, mass. Vascular: Aneurysm coiling reason right posterior communicating artery. Negative for hyper dense vessel Skull: Negative Sinuses/Orbits: Negative Other: None CT CERVICAL SPINE FINDINGS Alignment: Mild anterolisthesis C2-3. 5 mm anterolisthesis C3-4 unchanged. 3 mm anterolisthesis C4-5 unchanged. Mild retrolisthesis C5-6, C6-7. Mild anterolisthesis C7-T1 unchanged. Skull base and vertebrae: Negative for cervical spine fracture  Soft tissues and spinal canal: Negative for soft tissue mass or edema in the neck. Disc levels: Advanced disc and facet degeneration throughout the entire cervical spine. Spinal stenosis most prominent at C3-4. Multilevel foraminal encroachment. Upper chest: Lung apices clear bilaterally Other: None IMPRESSION: 1. White matter ischemia appears chronic. No acute intracranial abnormality. 2. 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 No acute intracranial hemorrhage 3. Advanced cervical spondylosis.  Negative for fracture. Electronically Signed   By: Franchot Gallo M.D.   On: 10/25/2021 19:06   CT Cervical Spine Wo Contrast  Result Date: 10/25/2021 CLINICAL DATA:  Acute neuro deficit. Neck and back pain. Possible trauma EXAM: CT HEAD WITHOUT CONTRAST CT CERVICAL SPINE WITHOUT CONTRAST TECHNIQUE: Multidetector CT imaging of the head and cervical spine was performed following the standard protocol without intravenous contrast. Multiplanar CT image reconstructions of the cervical spine were also generated. RADIATION DOSE REDUCTION: This exam was performed according to the departmental dose-optimization program which includes automated exposure control, adjustment of the mA and/or kV according to patient size and/or use of iterative reconstruction technique. COMPARISON:  CT head 07/17/2021 FINDINGS: CT HEAD FINDINGS Brain: Ventricle size normal. Patchy white matter hypodensity bilaterally consistent with chronic microvascular ischemia. Negative for acute infarct, hemorrhage, mass. Vascular: Aneurysm coiling reason right posterior communicating artery. Negative for hyper dense vessel Skull: Negative Sinuses/Orbits: Negative Other: None CT CERVICAL SPINE FINDINGS Alignment: Mild anterolisthesis C2-3. 5 mm anterolisthesis C3-4 unchanged. 3 mm anterolisthesis C4-5 unchanged. Mild retrolisthesis C5-6, C6-7. Mild anterolisthesis C7-T1 unchanged. Skull base and vertebrae: Negative for cervical  spine fracture Soft tissues and spinal canal: Negative for soft tissue mass or edema in the neck. Disc levels: Advanced disc and facet degeneration throughout the entire cervical spine. Spinal stenosis most prominent at C3-4. Multilevel foraminal encroachment. Upper chest: Lung apices clear bilaterally Other: None IMPRESSION: 1. White matter ischemia appears chronic. No acute intracranial abnormality. 2. 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 No acute intracranial hemorrhage 3. Advanced cervical spondylosis.  Negative for fracture. Electronically Signed   By: Franchot Gallo M.D.   On: 10/25/2021 19:06    I independently reviewed the above  imaging studies.  Procedure note: I attempted to pass a 71 French catheter under sterile conditions however this met resistance at the meatus.  Next, under sterile conditions, an 46 [place] female urethral sound was placed intermedius and her bladder was drained with 300 cc.  Next, is able to pass a 14 [place] Foley catheter into the bladder without difficulty with return of clear yellow urine. Over 2L drained initially.  Impression/Recommendation:   1.  Acute urinary retention: CT A/P 10/25/2021 with over distended bladder measuring 24 cm in craniocaudal projection.  7 French Foley catheter was placed at bedside. #2.  Bilateral hydronephrosis: As result of bladder outlet obstruction. 3. Acute renal failure  -Leave Foley catheter in place for at least 1 week given degree of bladder distention.  I will arrange for outpatient follow-up with [facility] to consider void trial and urodynamics. -Obtain urine culture. -Bilateral hydronephrosis should resolve within 48 hours.  Check renal ultrasound 48 hours to ensure this is resolved. -Monitor for postobstructive diuresis. -Trend creatinine. -Following peripherally.  Please call with questions.   Matt R. Hosam Mcfetridge MD 10/25/2021, 9:36 PM  [facility]  Pager: [phone]   CC:  Malvin Johns, MD

## 2021-10-25 NOTE — ED Triage Notes (Signed)
BIB EMS with abd hernia stating it is keeping her from having a BM and urinating. Facility states she has urinated today and had a BM

## 2021-10-25 NOTE — ED Notes (Signed)
Pt's O2 86-89% on RA, pt placed on 2lpm [place]. O2 increased to 94%

## 2021-10-25 NOTE — ED Notes (Signed)
Unsuccessful foley catheter insertion by staff. EDP aware.
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
## 2021-10-25 NOTE — H&P (Signed)
[place]   PATIENT NAME: Angela Medina    MR#:  [id number]  DATE OF BIRTH:  03/25/1951  DATE OF ADMISSION:  10/25/2021  PRIMARY CARE PHYSICIAN: System, Provider Not In   Patient is coming from: Home  REQUESTING/REFERRING PHYSICIAN: Rolan Bucco, MD  CHIEF COMPLAINT:   Chief Complaint  Patient presents with   Abdominal Pain    HISTORY OF PRESENT ILLNESS:  Angela Medina is a 71 y.o. female with medical history significant for depression and schizoaffective disorder, who presented to the emergency room with acute onset of abdominal pain with associated distention and diminished urine output over the last couple of days.  She has been getting progressively weak.  She has a history of cervical spinal stenosis for which she has refused surgery in the past.  No nausea or vomiting or diarrhea.  She has been constipated.  She admits to occasional cough productive of yellowish sputum without dyspnea or wheezing.  ED Course: When she came to the ER her BP was 159/98 and later 166/113 with respiratory to 27 and otherwise normal vital signs.BMP revealed a potassium of 5.7 sodium 132 and chloride 91 with a BUN of 71 and creatinine of 2.26 previously normal.  CBC showed mild cytosis of 11.2.  UA showed 6-10 RBCs  Imaging: Portable chest x-ray showed no acute cardiopulmonary disease.  Abdominal and pelvic CT scan revealed the following: Significantly over distended bladder with associated bilateral hydronephrosis.   Fat containing umbilical hernia.   Findings suspicious for rectal impaction although no wall thickening is seen. The difficulty moving bowels may be related to the over distended bladder causing extrinsic compression upon the colon.   11 mm left adrenal lesion is noted which measures 1 Hounsfield unit. 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  The patient had a Foley catheter placed with subsequent  drainage of 4 L of urine output.  She is admitted to a telemetry bed for further evaluation and management.  PAST MEDICAL HISTORY:   Past Medical History:  Diagnosis Date   Aneurysm (HCC)    Depression    Dysphagia    Insomnia    Schizoaffective disorder (HCC)    Vision changes    right eye    PAST SURGICAL HISTORY:   Past Surgical History:  Procedure Laterality Date   FOOT SURGERY      SOCIAL HISTORY:   Social History   Tobacco Use   Smoking status: Some Days    Types: Cigarettes   Smokeless tobacco: Never  Substance Use Topics   Alcohol use: Yes    FAMILY HISTORY:    Positive for CVA, hypertension, CVA and MI  DRUG ALLERGIES:   Allergies  Allergen Reactions   Methotrexate Derivatives Other (See Comments)    Hair  Fall  out   Penicillins Other (See Comments)    Unknown- childhood allergy    REVIEW OF SYSTEMS:   ROS As per history of present illness. All pertinent systems were reviewed above. Constitutional, HEENT, cardiovascular, respiratory, GI, GU, musculoskeletal, neuro, psychiatric, endocrine, integumentary and hematologic systems were reviewed and are otherwise negative/unremarkable except for positive findings mentioned above in the HPI.   MEDICATIONS AT HOME:   Prior to Admission medications   Medication Sig Start Date End Date Taking? Authorizing Provider  acetaminophen (TYLENOL) 500 MG tablet Take 1,000 mg by mouth every 6 (six) hours as needed for moderate pain or headache.  [provider]  albuterol (PROVENTIL) (2.5 MG/3ML) 0.083% nebulizer solution Take 3 mLs (2.5 mg total) by nebulization every 6 (six) hours as needed for wheezing or shortness of breath. 08/01/21   Osvaldo Shipper, MD  ALPRAZolam Prudy Feeler) 0.5 MG tablet Take 1 tablet (0.5 mg total) by mouth at bedtime as needed for anxiety. 08/01/21   Osvaldo Shipper, MD  amLODipine (NORVASC) 2.5 MG tablet Take 1 tablet (2.5 mg total) by mouth daily. 08/01/21   Osvaldo Shipper, MD   clobetasol ointment (TEMOVATE) 0.05 % Apply topically 2 (two) times daily. 08/01/21   Osvaldo Shipper, MD  diclofenac Sodium (VOLTAREN) 1 % GEL Apply 2 g topically every 8 (eight) hours as needed (pain). 08/01/21   Osvaldo Shipper, MD  gabapentin (NEURONTIN) 100 MG capsule Take 1 capsule (100 mg total) by mouth 3 (three) times daily. 08/01/21   Osvaldo Shipper, MD  HYDROcodone-acetaminophen (NORCO/VICODIN) 5-325 MG tablet Take 1 tablet by mouth every 6 (six) hours as needed for moderate pain. 08/01/21   Osvaldo Shipper, MD  melatonin 5 MG TABS Take 1 tablet (5 mg total) by mouth daily at 6 PM. 08/01/21   Osvaldo Shipper, MD  methocarbamol (ROBAXIN) 500 MG tablet Take 1 tablet (500 mg total) by mouth every 8 (eight) hours as needed for muscle spasms. 08/02/21   Osvaldo Shipper, MD  OLANZapine (ZYPREXA) 10 MG tablet Take 1 tablet (10 mg total) by mouth at bedtime. 08/01/21   Osvaldo Shipper, MD  polyethylene glycol (MIRALAX / GLYCOLAX) 17 g packet Take 17 g by mouth daily. 08/01/21   Osvaldo Shipper, MD  senna-docusate (SENOKOT-S) 8.6-50 MG tablet Take 2 tablets by mouth 2 (two) times daily. 08/01/21   Osvaldo Shipper, MD      VITAL SIGNS:  Blood pressure 114/73, pulse 71, temperature 98.5 F (36.9 C), temperature source Oral, resp. rate (!) 24, weight 65.8 kg, SpO2 96 %.  PHYSICAL EXAMINATION:  Physical Exam  GENERAL:  71 y.o.-year-old Caucasian female patient lying in the bed with no acute distress.  EYES: Pupils equal, round, reactive to light and accommodation. No scleral icterus. Extraocular muscles intact.  HEENT: Head atraumatic, normocephalic. Oropharynx and nasopharynx clear.  NECK:  Supple, no jugular venous distention. No thyroid enlargement, no tenderness.  LUNGS: Normal breath sounds bilaterally, no wheezing, rales,rhonchi or crepitation. No use of accessory muscles of respiration.  CARDIOVASCULAR: Regular rate and rhythm, S1, S2 normal. No murmurs, rubs, or gallops.  ABDOMEN: Soft,  nondistended, nontender. Bowel sounds present. No organomegaly or mass.  EXTREMITIES: No pedal edema, cyanosis, or clubbing.  NEUROLOGIC: Cranial nerves II through XII are intact. Muscle strength 5/5 in all extremities. Sensation intact. Gait not checked.  PSYCHIATRIC: The patient is alert and oriented x 3.  Normal affect and good eye contact. SKIN: No obvious rash, lesion, or ulcer.   LABORATORY PANEL:   CBC Recent Labs  Lab 10/26/21 0408  WBC 9.7  HGB 11.3*  HCT 35.5*  PLT 279   ------------------------------------------------------------------------------------------------------------------  Chemistries  Recent Labs  Lab 10/25/21 1443 10/26/21 0408  NA 132* 135  K 5.7* 4.8  CL 91* 95*  CO2 28 28  GLUCOSE 121* 84  BUN 71* 67*  CREATININE 2.26* 1.87*  CALCIUM 9.0 8.5*  AST 21  --   ALT 14  --   ALKPHOS 65  --   BILITOT 1.2  --    ------------------------------------------------------------------------------------------------------------------  Cardiac Enzymes No results for input(s): "TROPONINI" in the last 168 hours. ------------------------------------------------------------------------------------------------------------------  RADIOLOGY:  DG Chest Port 1  View  Result Date: 10/25/2021 CLINICAL DATA:  Shortness of breath EXAM: PORTABLE CHEST 1 VIEW COMPARISON:  07/18/2021 FINDINGS: Cardiac shadow is mildly enlarged but stable. Lungs are well aerated. Elevation of left hemidiaphragm is seen. No focal infiltrate is noted. No bony abnormality is seen. IMPRESSION: No acute abnormality noted. Electronically Signed   By: Alcide Clever M.D.   On: 10/25/2021 23:08   CT Thoracic Spine Wo Contrast  Result Date: 10/25/2021 CLINICAL DATA:  Neck and back pain.  Possible trauma EXAM: CT THORACIC SPINE WITHOUT CONTRAST TECHNIQUE: Multidetector CT images of the thoracic were obtained using the standard protocol without intravenous contrast. RADIATION DOSE REDUCTION: This exam was  performed according to the departmental dose-optimization program which includes automated exposure control, adjustment of the mA and/or kV according to patient size and/or use of iterative reconstruction technique. COMPARISON:  CT chest 04/01/2021 FINDINGS: Alignment: Normal alignment. 30 degrees of kyphosis centered at the chronic T8 fracture. Vertebrae: Severe fracture of T8 is chronic and unchanged from the prior CT. There appears to be ankylosis of T7 through T9. No acute fracture or mass. Paraspinal and other soft tissues: No paraspinous mass. Coronary artery calcification Visualized lungs are clear. Disc levels: Advanced spondylosis in the cervical spine. Disc degeneration and mild spurring at T2-3 without stenosis. No spinal stenosis in the thoracic spine IMPRESSION: Severe chronic fracture of T8.  No acute thoracic fracture 30 degrees of kyphosis related to T8 fracture No significant thoracic spinal stenosis. Electronically Signed   By: Marlan Palau M.D.   On: 10/25/2021 19:11   CT Abdomen Pelvis Wo Contrast  Result Date: 10/25/2021 CLINICAL DATA:  Acute abdominal pain with findings suggestive of constipation EXAM: CT ABDOMEN AND PELVIS WITHOUT CONTRAST TECHNIQUE: Multidetector CT imaging of the abdomen and pelvis was performed following the standard protocol without IV contrast. RADIATION DOSE REDUCTION: This exam was performed according to the departmental dose-optimization program which includes automated exposure control, adjustment of the mA and/or kV according to patient size and/or use of iterative reconstruction technique. COMPARISON:  None Available. FINDINGS: Lower chest: Elevation of left hemidiaphragm is noted. A few scattered calcified granulomas are noted. Right basilar atelectasis is seen. Hepatobiliary: No focal liver abnormality is seen. No gallstones, gallbladder wall thickening, or biliary dilatation. Pancreas: Unremarkable. No pancreatic ductal dilatation or surrounding inflammatory  changes. Spleen: Normal in size without focal abnormality. Adrenals/Urinary Tract: Adrenal glands are well visualized. A small 11 mm hypodense lesion is noted better visualized than on prior exams. It measures 1 Hounsfield unit. Kidneys are well visualized bilaterally. No renal calculi are seen. Bilateral hydronephrosis is noted secondary to a significantly over distended bladder. It measures approximately 24 cm in craniocaudad projection. Stomach/Bowel: Fecal material is noted throughout the colon and somewhat prominent in the rectal vault likely related to a degree of rectal impaction. The more proximal colon shows some fecal material within although no significant constipation is noted. The difficulty moving the patient's bowels may be related to the over distended bladder. The appendix is well visualized and within normal limits. Small bowel is displaced secondary to the distended bladder. No obstructive changes are seen. Stomach is within normal limits. Vascular/Lymphatic: Aortic atherosclerosis. No enlarged abdominal or pelvic lymph nodes. Reproductive: Uterus and bilateral adnexa are unremarkable. Other: No ascites is noted. Fat containing umbilical hernia is seen measuring 6.6 cm in greatest transverse dimension. Musculoskeletal: Degenerative changes of lumbar spine are noted. Scoliosis concave to the left is noted. IMPRESSION: Significantly over distended bladder with associated bilateral hydronephrosis.  Fat containing umbilical hernia. Findings suspicious for rectal impaction although no wall thickening is seen. The difficulty moving bowels may be related to the over distended bladder causing extrinsic compression upon the colon. 11 mm left adrenal lesion is noted which measures 1 Hounsfield unit. 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 Electronically Signed   By: Alcide Clever M.D.   On: 10/25/2021 19:10   CT Head Wo Contrast  Result  Date: 10/25/2021 CLINICAL DATA:  Acute neuro deficit. Neck and back pain. Possible trauma EXAM: CT HEAD WITHOUT CONTRAST CT CERVICAL SPINE WITHOUT CONTRAST TECHNIQUE: Multidetector CT imaging of the head and cervical spine was performed following the standard protocol without intravenous contrast. Multiplanar CT image reconstructions of the cervical spine were also generated. RADIATION DOSE REDUCTION: This exam was performed according to the departmental dose-optimization program which includes automated exposure control, adjustment of the mA and/or kV according to patient size and/or use of iterative reconstruction technique. COMPARISON:  CT head 07/17/2021 FINDINGS: CT HEAD FINDINGS Brain: Ventricle size normal. Patchy white matter hypodensity bilaterally consistent with chronic microvascular ischemia. Negative for acute infarct, hemorrhage, mass. Vascular: Aneurysm coiling reason right posterior communicating artery. Negative for hyper dense vessel Skull: Negative Sinuses/Orbits: Negative Other: None CT CERVICAL SPINE FINDINGS Alignment: Mild anterolisthesis C2-3. 5 mm anterolisthesis C3-4 unchanged. 3 mm anterolisthesis C4-5 unchanged. Mild retrolisthesis C5-6, C6-7. Mild anterolisthesis C7-T1 unchanged. Skull base and vertebrae: Negative for cervical spine fracture Soft tissues and spinal canal: Negative for soft tissue mass or edema in the neck. Disc levels: Advanced disc and facet degeneration throughout the entire cervical spine. Spinal stenosis most prominent at C3-4. Multilevel foraminal encroachment. Upper chest: Lung apices clear bilaterally Other: None IMPRESSION: 1. White matter ischemia appears chronic. No acute intracranial abnormality. 2. 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 No acute intracranial hemorrhage 3. Advanced cervical spondylosis.  Negative for fracture. Electronically Signed   By: Marlan Palau M.D.   On: 10/25/2021 19:06   CT Cervical Spine Wo  Contrast  Result Date: 10/25/2021 CLINICAL DATA:  Acute neuro deficit. Neck and back pain. Possible trauma EXAM: CT HEAD WITHOUT CONTRAST CT CERVICAL SPINE WITHOUT CONTRAST TECHNIQUE: Multidetector CT imaging of the head and cervical spine was performed following the standard protocol without intravenous contrast. Multiplanar CT image reconstructions of the cervical spine were also generated. RADIATION DOSE REDUCTION: This exam was performed according to the departmental dose-optimization program which includes automated exposure control, adjustment of the mA and/or kV according to patient size and/or use of iterative reconstruction technique. COMPARISON:  CT head 07/17/2021 FINDINGS: CT HEAD FINDINGS Brain: Ventricle size normal. Patchy white matter hypodensity bilaterally consistent with chronic microvascular ischemia. Negative for acute infarct, hemorrhage, mass. Vascular: Aneurysm coiling reason right posterior communicating artery. Negative for hyper dense vessel Skull: Negative Sinuses/Orbits: Negative Other: None CT CERVICAL SPINE FINDINGS Alignment: Mild anterolisthesis C2-3. 5 mm anterolisthesis C3-4 unchanged. 3 mm anterolisthesis C4-5 unchanged. Mild retrolisthesis C5-6, C6-7. Mild anterolisthesis C7-T1 unchanged. Skull base and vertebrae: Negative for cervical spine fracture Soft tissues and spinal canal: Negative for soft tissue mass or edema in the neck. Disc levels: Advanced disc and facet degeneration throughout the entire cervical spine. Spinal stenosis most prominent at C3-4. Multilevel foraminal encroachment. Upper chest: Lung apices clear bilaterally Other: None IMPRESSION: 1. White matter ischemia appears chronic. No acute intracranial abnormality. 2. 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 No acute intracranial hemorrhage  3. Advanced cervical spondylosis.  Negative for fracture. Electronically Signed   By: Marlan Palau M.D.   On: 10/25/2021 19:06       IMPRESSION AND PLAN:  Assessment and Plan: * AKI (acute kidney injury) (HCC) - This likely secondary to urinary retention requiring Foley catheter placement by urology. - We will follow renal functions with hydration. - We will avoid nephrotoxins.  Acute bilateral obstructive uropathy - This is clearly secondary to bladder outlet obstruction with subsequent bilateral hydronephrosis. - Foley catheter was placed by Dr. Cardell Peach with urology. - We will follow renal functions.  Hyperkalemia - She was given Lokelma and potassium will be followed.  Schizoaffective disorder (HCC) - We will continue Zyprexa  Anxiety - We will continue Xanax  Cervical spondylosis with myelopathy - We will continue her Neurontin  Hypertensive urgency -This Is likely secondary to pain. - We will continue her antihypertensives and place on as needed IV labetalol.    DVT prophylaxis: Lovenox.  Advanced Care Planning:  Code Status: The patient is DNR/DNI.   Family Communication:  The plan of care was discussed in details with the patient (and family). I answered all questions. The patient agreed to proceed with the above mentioned plan. Further management will depend upon hospital course. Disposition Plan: Back to previous home environment Consults called: Urology consult was obtained for Foley catheter placement with bladder outlet obstruction. All the records are reviewed and case discussed with ED provider.  Status is: Inpatient   At the time of the admission, it appears that the appropriate admission status for this patient is inpatient.  This is judged to be reasonable and necessary in order to provide the required intensity of service to ensure the patient's safety given the presenting symptoms, physical exam findings and initial radiographic and laboratory data in the context of comorbid conditions.  The patient requires inpatient status due to high intensity of service, high risk of further  deterioration and high frequency of surveillance required.  I certify that at the time of admission, it is my clinical judgment that the patient will require inpatient hospital care extending more than 2 midnights.                            Dispo: The patient is from: Home              Anticipated d/c is to: Home              Patient currently is not medically stable to d/c.              Difficult to place patient: No  Hannah Beat M.D on 10/26/2021 at 7:23 AM  [facility]   From 7 PM-7 AM, contact night-coverage [email or web address]  CC: Primary care physician; System, Provider Not In

## 2021-10-25 NOTE — ED Provider Triage Note (Signed)
Emergency Medicine Provider Triage Evaluation Note  Angela Medina , a 71 y.o. female  was evaluated in triage.  Pt complains of difficulty moving bowels and voiding. Abdominal mass which has grown, unsure if hernia.  Review of Systems  Positive: Problems with bowel or bladder habits Negative:   Physical Exam  BP (!) 145/110 (BP Location: Left Arm)   Pulse 71   Temp 98.5 F (36.9 C) (Oral)   Resp 20   SpO2 93%  Gen:   Awake, no distress   Resp:  Normal effort  MSK:   Moves extremities without difficulty  Other:  Appears to have umbilical hernia which is non tender  Medical Decision Making  Medically screening exam initiated at 1:43 PM.  Appropriate orders placed.  VALARY MANAHAN was informed that the remainder of the evaluation will be completed by another provider, this initial triage assessment does not replace that evaluation, and the importance of remaining in the ED until their evaluation is complete.     Jeannie Fend, PA-C 10/25/21 1345

## 2021-10-26 ENCOUNTER — Inpatient Hospital Stay (HOSPITAL_COMMUNITY): Payer: Medicare Other

## 2021-10-26 DIAGNOSIS — M4712 Other spondylosis with myelopathy, cervical region: Secondary | ICD-10-CM | POA: Diagnosis not present

## 2021-10-26 DIAGNOSIS — F419 Anxiety disorder, unspecified: Secondary | ICD-10-CM

## 2021-10-26 DIAGNOSIS — E875 Hyperkalemia: Secondary | ICD-10-CM

## 2021-10-26 DIAGNOSIS — N179 Acute kidney failure, unspecified: Secondary | ICD-10-CM | POA: Diagnosis not present

## 2021-10-26 DIAGNOSIS — F259 Schizoaffective disorder, unspecified: Secondary | ICD-10-CM

## 2021-10-26 DIAGNOSIS — N139 Obstructive and reflux uropathy, unspecified: Secondary | ICD-10-CM | POA: Diagnosis not present

## 2021-10-26 DIAGNOSIS — I160 Hypertensive urgency: Secondary | ICD-10-CM

## 2021-10-26 LAB — BASIC METABOLIC PANEL
Anion gap: 12 (ref 5–15)
BUN: 67 mg/dL — ABNORMAL HIGH (ref 8–23)
CO2: 28 mmol/L (ref 22–32)
Calcium: 8.5 mg/dL — ABNORMAL LOW (ref 8.9–10.3)
Chloride: 95 mmol/L — ABNORMAL LOW (ref 98–111)
Creatinine, Ser: 1.87 mg/dL — ABNORMAL HIGH (ref 0.44–1.00)
GFR, Estimated: 29 mL/min — ABNORMAL LOW (ref >60)
Glucose, Bld: 84 mg/dL (ref 70–99)
Potassium: 4.8 mmol/L (ref 3.5–5.1)
Sodium: 135 mmol/L (ref 135–145)

## 2021-10-26 LAB — CBC
HCT: 35.5 % — ABNORMAL LOW (ref 36.0–46.0)
Hemoglobin: 11.3 g/dL — ABNORMAL LOW (ref 12.0–15.0)
MCH: 31.6 pg (ref 26.0–34.0)
MCHC: 31.8 g/dL (ref 30.0–36.0)
MCV: 99.2 fL (ref 80.0–100.0)
Platelets: 279 10*3/uL (ref 150–400)
RBC: 3.58 MIL/uL — ABNORMAL LOW (ref 3.87–5.11)
RDW: 13.5 % (ref 11.5–15.5)
WBC: 9.7 10*3/uL (ref 4.0–10.5)
nRBC: 0 % (ref 0.0–0.2)

## 2021-10-26 MED ORDER — ALPRAZOLAM 0.5 MG PO TABS
0.5000 mg | ORAL_TABLET | Freq: Three times a day (TID) | ORAL | Status: DC
  Administered 2021-10-26 – 2021-10-28 (×7): 0.5 mg via ORAL
  Filled 2021-10-26 (×7): qty 1

## 2021-10-26 MED ORDER — HYDROCODONE-ACETAMINOPHEN 5-325 MG PO TABS
1.0000 | ORAL_TABLET | Freq: Four times a day (QID) | ORAL | Status: DC | PRN
  Administered 2021-10-26 – 2021-10-28 (×3): 1 via ORAL
  Filled 2021-10-26 (×3): qty 1

## 2021-10-26 MED ORDER — DICLOFENAC SODIUM 1 % EX GEL: 2.0000 g | Freq: Three times a day (TID) | CUTANEOUS | Status: DC | PRN

## 2021-10-26 MED ORDER — OLANZAPINE 5 MG PO TABS
15.0000 mg | ORAL_TABLET | Freq: Every day | ORAL | Status: DC
  Administered 2021-10-26 – 2021-10-27 (×2): 15 mg via ORAL
  Filled 2021-10-26 (×2): qty 1

## 2021-10-26 MED ORDER — AMLODIPINE BESYLATE 5 MG PO TABS
2.5000 mg | ORAL_TABLET | Freq: Every day | ORAL | Status: DC
  Administered 2021-10-26 – 2021-10-28 (×3): 2.5 mg via ORAL
  Filled 2021-10-26 (×3): qty 1

## 2021-10-26 MED ORDER — POLYETHYLENE GLYCOL 3350 17 G PO PACK
17.0000 g | PACK | Freq: Two times a day (BID) | ORAL | Status: DC
  Administered 2021-10-26 – 2021-10-28 (×5): 17 g via ORAL
  Filled 2021-10-26 (×5): qty 1

## 2021-10-26 MED ORDER — ONDANSETRON HCL 4 MG PO TABS: 4.0000 mg | ORAL_TABLET | Freq: Four times a day (QID) | ORAL | Status: DC | PRN

## 2021-10-26 MED ORDER — BISACODYL 10 MG RE SUPP
10.0000 mg | Freq: Once | RECTAL | Status: AC
  Administered 2021-10-26: 10 mg via RECTAL
  Filled 2021-10-26: qty 1

## 2021-10-26 MED ORDER — OLANZAPINE 10 MG PO TABS: 10.0000 mg | ORAL_TABLET | Freq: Every day | ORAL | Status: DC

## 2021-10-26 MED ORDER — ZOLPIDEM TARTRATE 5 MG PO TABS
5.0000 mg | ORAL_TABLET | Freq: Every evening | ORAL | Status: DC | PRN
  Administered 2021-10-26 – 2021-10-28 (×2): 5 mg via ORAL
  Filled 2021-10-26 (×2): qty 1

## 2021-10-26 MED ORDER — ENOXAPARIN SODIUM 30 MG/0.3ML IJ SOSY
30.0000 mg | PREFILLED_SYRINGE | INTRAMUSCULAR | Status: DC
  Administered 2021-10-26 – 2021-10-27 (×2): 30 mg via SUBCUTANEOUS
  Filled 2021-10-26 (×2): qty 0.3

## 2021-10-26 MED ORDER — ALBUTEROL SULFATE (2.5 MG/3ML) 0.083% IN NEBU
2.5000 mg | INHALATION_SOLUTION | Freq: Four times a day (QID) | RESPIRATORY_TRACT | Status: DC | PRN
Start: 2021-10-26 — End: 2021-10-28

## 2021-10-26 MED ORDER — ACETAMINOPHEN 650 MG RE SUPP: 650.0000 mg | Freq: Four times a day (QID) | RECTAL | Status: DC | PRN

## 2021-10-26 MED ORDER — TRAZODONE HCL 50 MG PO TABS: 25.0000 mg | ORAL_TABLET | Freq: Every evening | ORAL | Status: DC | PRN

## 2021-10-26 MED ORDER — PRO-STAT SUGAR FREE PO LIQD
30.0000 mL | Freq: Two times a day (BID) | ORAL | Status: DC
  Filled 2021-10-26 (×4): qty 30

## 2021-10-26 MED ORDER — SENNOSIDES-DOCUSATE SODIUM 8.6-50 MG PO TABS
2.0000 | ORAL_TABLET | Freq: Two times a day (BID) | ORAL | Status: DC
  Administered 2021-10-26 – 2021-10-28 (×5): 2 via ORAL
  Filled 2021-10-26 (×5): qty 2

## 2021-10-26 MED ORDER — PROSOURCE PLUS PO LIQD
30.0000 mL | Freq: Two times a day (BID) | ORAL | Status: DC
Start: 2021-10-26 — End: 2021-10-28
  Administered 2021-10-27 – 2021-10-28 (×2): 30 mL via ORAL
  Filled 2021-10-26 (×3): qty 30

## 2021-10-26 MED ORDER — CLOBETASOL PROPIONATE 0.05 % EX OINT
TOPICAL_OINTMENT | Freq: Two times a day (BID) | CUTANEOUS | Status: DC
  Filled 2021-10-26: qty 15

## 2021-10-26 MED ORDER — METHOCARBAMOL 500 MG PO TABS
500.0000 mg | ORAL_TABLET | Freq: Three times a day (TID) | ORAL | Status: DC | PRN
Start: 2021-10-26 — End: 2021-10-28
  Administered 2021-10-26: 500 mg via ORAL
  Filled 2021-10-26: qty 1

## 2021-10-26 MED ORDER — MAGNESIUM HYDROXIDE 400 MG/5ML PO SUSP: 30.0000 mL | Freq: Every day | ORAL | Status: DC | PRN

## 2021-10-26 MED ORDER — ACETAMINOPHEN 325 MG PO TABS
650.0000 mg | ORAL_TABLET | Freq: Four times a day (QID) | ORAL | Status: DC | PRN
  Administered 2021-10-26 – 2021-10-27 (×2): 650 mg via ORAL
  Filled 2021-10-26 (×2): qty 2

## 2021-10-26 MED ORDER — ONDANSETRON HCL 4 MG/2ML IJ SOLN: 4.0000 mg | Freq: Four times a day (QID) | INTRAMUSCULAR | Status: DC | PRN

## 2021-10-26 MED ORDER — SODIUM CHLORIDE 0.9 % IV SOLN: INTRAVENOUS | Status: DC

## 2021-10-26 NOTE — Progress Notes (Signed)
PROGRESS NOTE    Angela Medina  RUE:[id number] DOB: 01-23-1951 DOA: 10/25/2021 PCP: System, Provider Not In    Brief Narrative:  Angela Medina is a 71 y.o. female with medical history significant for depression and schizoaffective disorder, presented to hospital with acute onset abdominal pain with distention and decreased urinary output for couple of days with progressive weakness.  Patient does have a history of cervical stenosis and had refused to surgery in the past.  Patient also admitted to being constipated.  In the ED blood pressure was slightly elevated.  BMP showed creatinine of 2.2 and potassium of 5.7.  Mild leukocytosis at 11.2.  UA showed 6-10 RBCs.  CT scan of the abdomen and pelvis showed fat containing umbilical hernia with distended bladder and bilateral hydronephrosis with rectal impaction.  Patient was then considered for admission to the hospital for further evaluation and treatment.   Assessment and Plan:  Principal Problem:   AKI (acute kidney injury) (HCC) Active Problems:   Acute bilateral obstructive uropathy   Hyperkalemia   Schizoaffective disorder (HCC)   Anxiety   Cervical spondylosis with myelopathy   Hypertensive urgency   * AKI (acute kidney injury) (HCC) Likely secondary to urinary retention.  Status post Foley catheter placement.  Urology has seen the patient and recommend placing in the Foley catheter for at least 1 week and will need outpatient follow-up with urology on discharge for voiding trial..  Continue normal saline for at least 1 more day today.  Avoid nephrotoxic medications.  Hold gabapentin from home for today  Acute bilateral obstructive uropathy Bladder outlet obstruction with subsequent bilateral hydronephrosis.  Foley catheter placed in by Dr. Cardell Peach urology.  Will need to follow-up as outpatient.  Urology recommends renal ultrasound in 48 hours to ensure resolution of hydronephrosis.  Neck pain back pain. CT head Was negative  chronic white matter ischemic changes.  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  CT cervical spine shows cervical spondylosis.  Negative for fracture.  Thoracic spine showed chronic fracture of the T8 vertebra with some kyphosis.  Continue supportive care.  Hyperkalemia Improved after Lokelma.  Potassium of 4.8 today.   Schizoaffective disorder (HCC) Continue Zyprexa   Anxiety Continue Xanax   Cervical spondylosis with myelopathy On Neurontin.  Has refused neuro surgery in the past.  Currently wheelchair-bound.   Hypertensive urgency Secondary to pain.  Has improved at this time.  Constipation.  We will work on bowel regimen.  We will continue with MiraLAX docusate and Dulcolax suppository.  Hold off with the hydrocodone from home.  Debility deconditioning lower extremity weakness.  Has been wheelchair-bound mostly recently and had been seen in the past for neck pain back pain.  Refused to have surgery in the neck.  We will get PT evaluation again while in the hospital        DVT prophylaxis: enoxaparin (LOVENOX) injection 30 mg Start: 10/26/21 1000   Code Status:     Code Status: DNR  Disposition: Skilled nursing facility Status is: Inpatient Remains inpatient appropriate because: Acute kidney injury, constipation and acute urinary retention,   Family Communication: Communicated with the patient at bedside.  Consultants:  Urology  Procedures:  Foley catheter placement  Antimicrobials:  None  Anti-infectives (From admission, onward)    None      Subjective: Today, patient was seen and examined at bedside.  Patient denies any nausea vomiting but does not feel too well.  Has mild shortness of breath and has  supplemental oxygen  Objective: Vitals:   10/26/21 0700 10/26/21 0800 10/26/21 0900 10/26/21 1000  BP: 114/73 121/67 (!) 152/89 123/65  Pulse: 71 63 87 69  Resp: (!) 24 (!) 23 (!) 21 17  Temp:      TempSrc:      SpO2: 96% 97% 98%  94%  Weight:        Intake/Output Summary (Last 24 hours) at 10/26/2021 1103 Last data filed at 10/26/2021 0556 Gross per 24 hour  Intake --  Output 5200 ml  Net -5200 ml   Filed Weights   10/26/21 0255  Weight: 65.8 kg    Physical Examination: Body mass index is 27.55 kg/m.  General:  Average built, not in obvious distress nasal cannula oxygen HENT:   No scleral pallor or icterus noted. Oral mucosa is moist.  Tenderness over the neck area Chest:   Diminished breath sounds bilaterally. No crackles or wheezes.  CVS: S1 &S2 heard. No murmur.  Regular rate and rhythm. Abdomen: Soft, nontender,, Foley catheter in place Extremities: No cyanosis, clubbing or edema.  Peripheral pulses are palpable. Psych: Alert, awake and communicative. CNS:  No cranial nerve deficits.  Power equal in all extremities.   Skin: Warm and dry.  No rashes noted.  Data Reviewed:   CBC: Recent Labs  Lab 10/25/21 1443 10/26/21 0408  WBC 11.2* 9.7  NEUTROABS 7.3  --   HGB 12.9 11.3*  HCT 38.5 35.5*  MCV 96.0 99.2  PLT 331 279    Basic Metabolic Panel: Recent Labs  Lab 10/25/21 1443 10/26/21 0408  NA 132* 135  K 5.7* 4.8  CL 91* 95*  CO2 28 28  GLUCOSE 121* 84  BUN 71* 67*  CREATININE 2.26* 1.87*  CALCIUM 9.0 8.5*    Liver Function Tests: Recent Labs  Lab 10/25/21 1443  AST 21  ALT 14  ALKPHOS 65  BILITOT 1.2  PROT 7.3  ALBUMIN 3.7     Radiology Studies: DG Chest Port 1 View  Result Date: 10/25/2021 CLINICAL DATA:  Shortness of breath EXAM: PORTABLE CHEST 1 VIEW COMPARISON:  07/18/2021 FINDINGS: Cardiac shadow is mildly enlarged but stable. Lungs are well aerated. Elevation of left hemidiaphragm is seen. No focal infiltrate is noted. No bony abnormality is seen. IMPRESSION: No acute abnormality noted. Electronically Signed   By: Alcide Clever M.D.   On: 10/25/2021 23:08   CT Thoracic Spine Wo Contrast  Result Date: 10/25/2021 CLINICAL DATA:  Neck and back pain.  Possible trauma  EXAM: CT THORACIC SPINE WITHOUT CONTRAST TECHNIQUE: Multidetector CT images of the thoracic were obtained using the standard protocol without intravenous contrast. RADIATION DOSE REDUCTION: This exam was performed according to the departmental dose-optimization program which includes automated exposure control, adjustment of the mA and/or kV according to patient size and/or use of iterative reconstruction technique. COMPARISON:  CT chest 04/01/2021 FINDINGS: Alignment: Normal alignment. 30 degrees of kyphosis centered at the chronic T8 fracture. Vertebrae: Severe fracture of T8 is chronic and unchanged from the prior CT. There appears to be ankylosis of T7 through T9. No acute fracture or mass. Paraspinal and other soft tissues: No paraspinous mass. Coronary artery calcification Visualized lungs are clear. Disc levels: Advanced spondylosis in the cervical spine. Disc degeneration and mild spurring at T2-3 without stenosis. No spinal stenosis in the thoracic spine IMPRESSION: Severe chronic fracture of T8.  No acute thoracic fracture 30 degrees of kyphosis related to T8 fracture No significant thoracic spinal stenosis. Electronically Signed   By:  Marlan Palau M.D.   On: 10/25/2021 19:11   CT Abdomen Pelvis Wo Contrast  Result Date: 10/25/2021 CLINICAL DATA:  Acute abdominal pain with findings suggestive of constipation EXAM: CT ABDOMEN AND PELVIS WITHOUT CONTRAST TECHNIQUE: Multidetector CT imaging of the abdomen and pelvis was performed following the standard protocol without IV contrast. RADIATION DOSE REDUCTION: This exam was performed according to the departmental dose-optimization program which includes automated exposure control, adjustment of the mA and/or kV according to patient size and/or use of iterative reconstruction technique. COMPARISON:  None Available. FINDINGS: Lower chest: Elevation of left hemidiaphragm is noted. A few scattered calcified granulomas are noted. Right basilar atelectasis is  seen. Hepatobiliary: No focal liver abnormality is seen. No gallstones, gallbladder wall thickening, or biliary dilatation. Pancreas: Unremarkable. No pancreatic ductal dilatation or surrounding inflammatory changes. Spleen: Normal in size without focal abnormality. Adrenals/Urinary Tract: Adrenal glands are well visualized. A small 11 mm hypodense lesion is noted better visualized than on prior exams. It measures 1 Hounsfield unit. Kidneys are well visualized bilaterally. No renal calculi are seen. Bilateral hydronephrosis is noted secondary to a significantly over distended bladder. It measures approximately 24 cm in craniocaudad projection. Stomach/Bowel: Fecal material is noted throughout the colon and somewhat prominent in the rectal vault likely related to a degree of rectal impaction. The more proximal colon shows some fecal material within although no significant constipation is noted. The difficulty moving the patient's bowels may be related to the over distended bladder. The appendix is well visualized and within normal limits. Small bowel is displaced secondary to the distended bladder. No obstructive changes are seen. Stomach is within normal limits. Vascular/Lymphatic: Aortic atherosclerosis. No enlarged abdominal or pelvic lymph nodes. Reproductive: Uterus and bilateral adnexa are unremarkable. Other: No ascites is noted. Fat containing umbilical hernia is seen measuring 6.6 cm in greatest transverse dimension. Musculoskeletal: Degenerative changes of lumbar spine are noted. Scoliosis concave to the left is noted. IMPRESSION: Significantly over distended bladder with associated bilateral hydronephrosis. Fat containing umbilical hernia. Findings suspicious for rectal impaction although no wall thickening is seen. The difficulty moving bowels may be related to the over distended bladder causing extrinsic compression upon the colon. 11 mm left adrenal lesion is noted which measures 1 Hounsfield unit.  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 Electronically Signed   By: Alcide Clever M.D.   On: 10/25/2021 19:10   CT Head Wo Contrast  Result Date: 10/25/2021 CLINICAL DATA:  Acute neuro deficit. Neck and back pain. Possible trauma EXAM: CT HEAD WITHOUT CONTRAST CT CERVICAL SPINE WITHOUT CONTRAST TECHNIQUE: Multidetector CT imaging of the head and cervical spine was performed following the standard protocol without intravenous contrast. Multiplanar CT image reconstructions of the cervical spine were also generated. RADIATION DOSE REDUCTION: This exam was performed according to the departmental dose-optimization program which includes automated exposure control, adjustment of the mA and/or kV according to patient size and/or use of iterative reconstruction technique. COMPARISON:  CT head 07/17/2021 FINDINGS: CT HEAD FINDINGS Brain: Ventricle size normal. Patchy white matter hypodensity bilaterally consistent with chronic microvascular ischemia. Negative for acute infarct, hemorrhage, mass. Vascular: Aneurysm coiling reason right posterior communicating artery. Negative for hyper dense vessel Skull: Negative Sinuses/Orbits: Negative Other: None CT CERVICAL SPINE FINDINGS Alignment: Mild anterolisthesis C2-3. 5 mm anterolisthesis C3-4 unchanged. 3 mm anterolisthesis C4-5 unchanged. Mild retrolisthesis C5-6, C6-7. Mild anterolisthesis C7-T1 unchanged. Skull base and vertebrae: Negative for cervical spine fracture Soft tissues and  spinal canal: Negative for soft tissue mass or edema in the neck. Disc levels: Advanced disc and facet degeneration throughout the entire cervical spine. Spinal stenosis most prominent at C3-4. Multilevel foraminal encroachment. Upper chest: Lung apices clear bilaterally Other: None IMPRESSION: 1. White matter ischemia appears chronic. No acute intracranial abnormality. 2. Aneurysm coiling in the region of the right posterior  communicating artery. No acute intracranial hemorrhage 3. Advanced cervical spondylosis.  Negative for fracture. Electronically Signed   By: Marlan Palau M.D.   On: 10/25/2021 19:06   CT Cervical Spine Wo Contrast  Result Date: 10/25/2021 CLINICAL DATA:  Acute neuro deficit. Neck and back pain. Possible trauma EXAM: CT HEAD WITHOUT CONTRAST CT CERVICAL SPINE WITHOUT CONTRAST TECHNIQUE: Multidetector CT imaging of the head and cervical spine was performed following the standard protocol without intravenous contrast. Multiplanar CT image reconstructions of the cervical spine were also generated. RADIATION DOSE REDUCTION: This exam was performed according to the departmental dose-optimization program which includes automated exposure control, adjustment of the mA and/or kV according to patient size and/or use of iterative reconstruction technique. COMPARISON:  CT head 07/17/2021 FINDINGS: CT HEAD FINDINGS Brain: Ventricle size normal. Patchy white matter hypodensity bilaterally consistent with chronic microvascular ischemia. Negative for acute infarct, hemorrhage, mass. Vascular: Aneurysm coiling reason right posterior communicating artery. Negative for hyper dense vessel Skull: Negative Sinuses/Orbits: Negative Other: None CT CERVICAL SPINE FINDINGS Alignment: Mild anterolisthesis C2-3. 5 mm anterolisthesis C3-4 unchanged. 3 mm anterolisthesis C4-5 unchanged. Mild retrolisthesis C5-6, C6-7. Mild anterolisthesis C7-T1 unchanged. Skull base and vertebrae: Negative for cervical spine fracture Soft tissues and spinal canal: Negative for soft tissue mass or edema in the neck. Disc levels: Advanced disc and facet degeneration throughout the entire cervical spine. Spinal stenosis most prominent at C3-4. Multilevel foraminal encroachment. Upper chest: Lung apices clear bilaterally Other: None IMPRESSION: 1. White matter ischemia appears chronic. No acute intracranial abnormality. 2. Aneurysm coiling in the region of the  right posterior communicating artery. No acute intracranial hemorrhage 3. Advanced cervical spondylosis.  Negative for fracture. Electronically Signed   By: Marlan Palau M.D.   On: 10/25/2021 19:06      LOS: 1 day    Joycelyn Das, MD [facility] Available via Epic secure chat 7am-7pm After these hours, please refer to coverage provider listed on [email or web address] 10/26/2021, 11:03 AM

## 2021-10-26 NOTE — Assessment & Plan Note (Signed)
-   We will continue her Neurontin. 

## 2021-10-26 NOTE — Assessment & Plan Note (Signed)
-   We will continue Xanax. 

## 2021-10-26 NOTE — Assessment & Plan Note (Signed)
-   She was given Lokelma and potassium will be followed.

## 2021-10-26 NOTE — Hospital Course (Addendum)
Angela Medina is a 71 y.o. female with medical history significant for depression and schizoaffective disorder, presented to hospital with acute onset abdominal pain with distention and decreased urinary output for couple of days with progressive weakness.  Patient does have a history of cervical stenosis and had refused to surgery in the past.  Patient also admitted to being constipated.  In the ED blood pressure was slightly elevated.  BMP showed creatinine of 2.2 and potassium of 5.7.  Mild leukocytosis at 11.2.  UA showed 6-10 RBCs.  CT scan of the abdomen and pelvis showed fat containing umbilical hernia with distended bladder and bilateral hydronephrosis with rectal impaction.  Patient was then considered for admission to the hospital for further evaluation and treatment.   Assessment and Plan:  Principal Problem:   AKI (acute kidney injury) (HCC) Active Problems:   Acute bilateral obstructive uropathy   Hyperkalemia   Schizoaffective disorder (HCC)   Anxiety   Cervical spondylosis with myelopathy   Hypertensive urgency   * AKI (acute kidney injury) (HCC) Likely secondary to urinary retention.  Status post Foley catheter placement.  Urology has seen the patient and recommend placing in the Foley catheter for at least 1 week and will need outpatient follow-up with urology on discharge for voiding trial..  Continue normal saline for at least 1 more day today.  Avoid nephrotoxic medications.  Hold gabapentin from home for today  Acute bilateral obstructive uropathy Bladder outlet obstruction with subsequent bilateral hydronephrosis.  Foley catheter placed in by Dr. Cardell Peach urology.  Will need to follow-up as outpatient.  Urology recommends renal ultrasound in 48 hours to ensure resolution of hydronephrosis.  Neck pain back pain. CT head Was negative chronic white matter ischemic changes.  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  CT cervical spine shows cervical  spondylosis.  Negative for fracture.  Thoracic spine showed chronic fracture of the T8 vertebra with some kyphosis.  Continue supportive care.  Hyperkalemia Improved after Lokelma.  Potassium of 4.8 today.   Schizoaffective disorder (HCC) Continue Zyprexa   Anxiety Continue Xanax   Cervical spondylosis with myelopathy On Neurontin.  Has refused neuro surgery in the past.  Currently wheelchair-bound.   Hypertensive urgency Secondary to pain.  Has improved at this time.  Constipation.  We will work on bowel regimen.  We will continue with MiraLAX docusate and Dulcolax suppository.  Hold off with the hydrocodone from home.  Debility deconditioning lower extremity weakness.  Has been wheelchair-bound mostly recently and had been seen in the past for neck pain back pain.  Refused to have surgery in the neck.  We will get PT evaluation again while in the hospital

## 2021-10-26 NOTE — Assessment & Plan Note (Signed)
-  This Is likely secondary to pain. - We will continue her antihypertensives and place on as needed IV labetalol.

## 2021-10-26 NOTE — Assessment & Plan Note (Signed)
-   This is clearly secondary to bladder outlet obstruction with subsequent bilateral hydronephrosis. - Foley catheter was placed by Dr. Cardell Peach with urology. - We will follow renal functions.

## 2021-10-26 NOTE — Assessment & Plan Note (Signed)
-   We will continue Zyprexa  

## 2021-10-26 NOTE — Consult Note (Signed)
WOC Nurse Consult Note: Patient receiving care in [id number]. Reason for Consult: DTPI to right buttocks, multiple calluses on bilateral feet The patient refused to allow me to assess her buttocks or her feet.  I spoke with her primary RN and explained the patient refused, and that she has the right to refuse. The nurse requested Prevalon heel lift boots, which I have provided an order for. What the primary nurse describes is consistent with a DTPI to the right ischium.  I explained that the primary objective for that area is to off load the pressure with positioning.  WOC nurse will not follow at this time.  Please re-consult the WOC team if needed.  Helmut Muster, RN, MSN, CWOCN, CNS-BC, pager [phone]

## 2021-10-26 NOTE — Assessment & Plan Note (Addendum)
-   This likely secondary to urinary retention requiring Foley catheter placement by urology. - We will follow renal functions with hydration. - We will avoid nephrotoxins.

## 2021-10-27 ENCOUNTER — Inpatient Hospital Stay (HOSPITAL_COMMUNITY): Payer: Medicare Other

## 2021-10-27 DIAGNOSIS — N179 Acute kidney failure, unspecified: Secondary | ICD-10-CM | POA: Diagnosis not present

## 2021-10-27 LAB — BASIC METABOLIC PANEL
Anion gap: 7 (ref 5–15)
BUN: 34 mg/dL — ABNORMAL HIGH (ref 8–23)
CO2: 32 mmol/L (ref 22–32)
Calcium: 8.4 mg/dL — ABNORMAL LOW (ref 8.9–10.3)
Chloride: 102 mmol/L (ref 98–111)
Creatinine, Ser: 0.62 mg/dL (ref 0.44–1.00)
GFR, Estimated: >60 mL/min (ref >60)
Glucose, Bld: 102 mg/dL — ABNORMAL HIGH (ref 70–99)
Potassium: 3.6 mmol/L (ref 3.5–5.1)
Sodium: 141 mmol/L (ref 135–145)

## 2021-10-27 LAB — CBC
HCT: 34.4 % — ABNORMAL LOW (ref 36.0–46.0)
Hemoglobin: 11 g/dL — ABNORMAL LOW (ref 12.0–15.0)
MCH: 32.4 pg (ref 26.0–34.0)
MCHC: 32 g/dL (ref 30.0–36.0)
MCV: 101.2 fL — ABNORMAL HIGH (ref 80.0–100.0)
Platelets: 301 10*3/uL (ref 150–400)
RBC: 3.4 MIL/uL — ABNORMAL LOW (ref 3.87–5.11)
RDW: 13.3 % (ref 11.5–15.5)
WBC: 13.4 10*3/uL — ABNORMAL HIGH (ref 4.0–10.5)
nRBC: 0 % (ref 0.0–0.2)

## 2021-10-27 LAB — MAGNESIUM: Magnesium: 2.1 mg/dL (ref 1.7–2.4)

## 2021-10-27 MED ORDER — CHLORHEXIDINE GLUCONATE CLOTH 2 % EX PADS
6.0000 | MEDICATED_PAD | Freq: Every day | CUTANEOUS | Status: DC
  Administered 2021-10-27 – 2021-10-28 (×2): 6 via TOPICAL

## 2021-10-27 MED ORDER — GLYCERIN (LAXATIVE) 2 G RE SUPP
1.0000 | Freq: Every day | RECTAL | Status: DC
  Administered 2021-10-27 – 2021-10-28 (×2): 1 via RECTAL
  Filled 2021-10-27 (×2): qty 1

## 2021-10-27 MED ORDER — ENOXAPARIN SODIUM 40 MG/0.4ML IJ SOSY
40.0000 mg | PREFILLED_SYRINGE | INTRAMUSCULAR | Status: DC
  Administered 2021-10-28: 40 mg via SUBCUTANEOUS
  Filled 2021-10-27: qty 0.4

## 2021-10-27 NOTE — Progress Notes (Signed)
PROGRESS NOTE  Angela Medina ZSW:[id number] DOB: 1950-10-20 DOA: 10/25/2021 PCP: System, Provider Not In   LOS: 2 days   Brief Narrative / Interim history: Angela Medina is a 71 y.o. female with medical history significant for depression and schizoaffective disorder, presented to hospital with acute onset abdominal pain with distention and decreased urinary output for couple of days with progressive weakness.  Patient does have a history of cervical stenosis and had refused to surgery in the past.  Patient also admitted to being constipated.  In the ED blood pressure was slightly elevated.  BMP showed creatinine of 2.2 and potassium of 5.7.  Mild leukocytosis at 11.2.  UA showed 6-10 RBCs.  CT scan of the abdomen and pelvis showed fat containing umbilical hernia with distended bladder and bilateral hydronephrosis with rectal impaction.  Patient was then considered for admission to the hospital for further evaluation and treatment.  Subjective / 24h Interval events: She does not feel good this morning, complains of abdominal discomfort and constipation.  Assesement and Plan: Principal Problem:   AKI (acute kidney injury) (HCC) Active Problems:   Acute bilateral obstructive uropathy   Hyperkalemia   Schizoaffective disorder (HCC)   Anxiety   Cervical spondylosis with myelopathy   Hypertensive urgency   Principal problem Acute kidney injury-due to urinary retention.  Underwent catheter placement by urology on 7/6.  Renal function has now normalized.  Decrease the rate for her IV fluids.  Repeat renal ultrasound today  Active problems Neck pain, back pain, cervical spondylosis with myelopathy-nonambulatory, currently wheelchair-bound.  On Neurontin.  She has seen neurosurgery in the past and per notes has refused surgery.  Hyperkalemia-potassium now normalized after Lokelma  Schizoaffective disorder, anxiety-continue Zyprexa, Xanax  Hypertension-due to pain,  improved  Constipation-suppository this morning, suspect she will need that more often due to immobility  Debility, deconditioning-from SNF  Scheduled Meds:  (feeding supplement) PROSource Plus  30 mL Oral BID   ALPRAZolam  0.5 mg Oral TID   amLODipine  2.5 mg Oral Daily   Chlorhexidine Gluconate Cloth  6 each Topical Q0600   clobetasol ointment   Topical BID   enoxaparin (LOVENOX) injection  30 mg Subcutaneous Q24H   feeding supplement (PRO-STAT SUGAR FREE 64)  30 mL Oral BID   Glycerin (Adult)  1 suppository Rectal Daily   OLANZapine  15 mg Oral QHS   polyethylene glycol  17 g Oral BID   senna-docusate  2 tablet Oral BID   Continuous Infusions:  sodium chloride 100 mL/hr at 10/27/21 1149   PRN Meds:.acetaminophen **OR** acetaminophen, albuterol, diclofenac Sodium, HYDROcodone-acetaminophen, magnesium hydroxide, methocarbamol, ondansetron **OR** ondansetron (ZOFRAN) IV, zolpidem  Diet Orders (From admission, onward)     Start     Ordered   10/26/21 0114  Diet Heart Room service appropriate? Yes; Fluid consistency: Thin  Diet effective now       Question Answer Comment  Room service appropriate? Yes   Fluid consistency: Thin      10/26/21 0115            DVT prophylaxis: enoxaparin (LOVENOX) injection 30 mg Start: 10/26/21 1000   Lab Results  Component Value Date   PLT 301 10/27/2021      Code Status: DNR  Family Communication: no family at bedside   Status is: Inpatient  Remains inpatient appropriate because: Abdominal pain   Level of care: Telemetry  Consultants:  Urology  Objective: Vitals:   10/26/21 1347 10/26/21 1403 10/26/21 2142 10/27/21 3220  BP: 114/87 114/87 134/82 (!) 142/94  Pulse: 70 70 79 90  Resp: 16 16 18 18   Temp: 98.2 F (36.8 C) 98.2 F (36.8 C) 97.9 F (36.6 C) 99.6 F (37.6 C)  TempSrc: Oral Oral Oral Oral  SpO2: 98%  94% 95%  Weight:  62.7 kg    Height:  5' (1.524 m)      Intake/Output Summary (Last 24 hours) at  10/27/2021 1250 Last data filed at 10/27/2021 0700 Gross per 24 hour  Intake 2356.16 ml  Output 2500 ml  Net -143.84 ml   Wt Readings from Last 3 Encounters:  10/26/21 62.7 kg  07/22/21 62.4 kg  07/21/21 61.2 kg    Examination:  Constitutional: NAD Eyes: no scleral icterus ENMT: Mucous membranes are moist.  Neck: normal, supple Respiratory: clear to auscultation bilaterally, no wheezing, no crackles.  Cardiovascular: Regular rate and rhythm, no murmurs / rubs / gallops.  Abdomen: non distended, no tenderness. Bowel sounds positive.  Musculoskeletal: no clubbing / cyanosis.  Skin: no rashes Neurologic: Generalized weakness   Data Reviewed: I have independently reviewed following labs and imaging studies   CBC Recent Labs  Lab 10/25/21 1443 10/26/21 0408 10/27/21 0805  WBC 11.2* 9.7 13.4*  HGB 12.9 11.3* 11.0*  HCT 38.5 35.5* 34.4*  PLT 331 279 301  MCV 96.0 99.2 101.2*  MCH 32.2 31.6 32.4  MCHC 33.5 31.8 32.0  RDW 13.6 13.5 13.3  LYMPHSABS 1.8  --   --   MONOABS 1.7*  --   --   EOSABS 0.4  --   --   BASOSABS 0.1  --   --     Recent Labs  Lab 10/25/21 1443 10/26/21 0408 10/27/21 0805  NA 132* 135 141  K 5.7* 4.8 3.6  CL 91* 95* 102  CO2 28 28 32  GLUCOSE 121* 84 102*  BUN 71* 67* 34*  CREATININE 2.26* 1.87* 0.62  CALCIUM 9.0 8.5* 8.4*  AST 21  --   --   ALT 14  --   --   ALKPHOS 65  --   --   BILITOT 1.2  --   --   ALBUMIN 3.7  --   --   MG  --   --  2.1    ------------------------------------------------------------------------------------------------------------------ No results for input(s): "CHOL", "HDL", "LDLCALC", "TRIG", "CHOLHDL", "LDLDIRECT" in the last 72 hours.  Lab Results  Component Value Date   HGBA1C 5.7 (H) 07/24/2021   ------------------------------------------------------------------------------------------------------------------ No results for input(s): "TSH", "T4TOTAL", "T3FREE", "THYROIDAB" in the last 72 hours.  Invalid  input(s): "FREET3"  Cardiac Enzymes No results for input(s): "CKMB", "TROPONINI", "MYOGLOBIN" in the last 168 hours.  Invalid input(s): "CK" ------------------------------------------------------------------------------------------------------------------    Component Value Date/Time   BNP 223.5 (H) 04/12/2021 0923    CBG: No results for input(s): "GLUCAP" in the last 168 hours.  No results found for this or any previous visit (from the past 240 hour(s)).   Radiology Studies: No results found.   04/14/2021, MD, PhD [facility]  Between 7 am - 7 pm I am available, please contact me via Amion (for emergencies) or Securechat (non urgent messages)  Between 7 pm - 7 am I am not available, please contact night coverage MD/APP via Amion

## 2021-10-27 NOTE — TOC Progression Note (Addendum)
Transition of Care [facility]) - Progression Note    Patient Details  Name: Angela Medina MRN: [id number] Date of Birth: July 17, 1950  [facility] CM/SW Contact  Darleene Cleaver, [place] Phone Number: 10/27/2021, 10:55 AM  Clinical Narrative:     Patient if from [facility], CSW left a message with Randa Lynn in admissions to find out when she can return.  CSW awaiting for call back .  2:45pm  CSW spoke to [place] at [facility], she said they can admit patient back to SNF tomorrow if she is medically ready for discharge.       Expected Discharge Plan and Services                                                 Social Determinants of Health (SDOH) Interventions    Readmission Risk Interventions     No data to display

## 2021-10-27 NOTE — Evaluation (Signed)
Occupational Therapy Evaluation Patient Details Name: Angela Medina MRN: [id number] DOB: 25-Jun-1950 Today's Date: 10/27/2021   History of Present Illness 71 y.o. female with medical history significant for depression and schizoaffective disorder, recent admission 07/21/21 with severe cervical spine stenosis for which surgery was recommended but pt declined. Pt  presented to hospital with acute onset abdominal pain with distention and decreased urinary output for couple of days with progressive weakness. Dx of AKI, urinary retention, umbilical hernia.   Clinical Impression   Patient is a 71 year old female who is a long term care resident at a SNF. Patient appears to be at baseline for ADLs at this time. Patient reported using built up handles for self feeding when available at SNF with spillage usual for her. . Patient to transition back to SNF at time of d/c.  No acute skilled OT services identified at this time. OT signing off. Thank you for this referral.      Recommendations for follow up therapy are one component of a multi-disciplinary discharge planning process, led by the attending physician.  Recommendations may be updated based on patient status, additional functional criteria and insurance authorization.   Follow Up Recommendations  Other (comment) (return to SNF)    Assistance Recommended at Discharge Frequent or constant Supervision/Assistance  Patient can return home with the following Two people to help with walking and/or transfers;Two people to help with bathing/dressing/bathroom;Direct supervision/assist for medications management;Help with stairs or ramp for entrance;Assist for transportation;Direct supervision/assist for financial management;Assistance with cooking/housework    Functional Status Assessment  Patient has not had a recent decline in their functional status  Equipment Recommendations  None recommended by OT    Recommendations for Other Services        Precautions / Restrictions Precautions Precautions: Fall Restrictions Weight Bearing Restrictions: No      Mobility Bed Mobility                    Transfers                          Balance                                           ADL either performed or assessed with clinical judgement   ADL Overall ADL's : Needs assistance/impaired Eating/Feeding: Minimal assistance;Bed level Eating/Feeding Details (indicate cue type and reason): patient needed increased education on proper positioning in bed for self feeding tasks with patient asking to recline backwards mutliple times. patient reported decreased ability to hold utensils in RUE. patient was provided with red foam to build up utensils. patient reported that she is suppoed to have built up utensils at the SNF. patient was noted to use a quadrupod grasp with red foam with no spillage noted. patient then shifted grasp pattern to a tripod graps with increased spillage noted. patient had increased difficulty managing utensil between index and thumb. patient was educated on going back to quadrupod grasp pattern with no spillage. patient reported she did not know how to do that. education was provided to return to this grasp pattern. patient was noted to be frustrated with spillage and declined to continue at this time. patient was provided with new hosptial gown and session was ended. nurse and NT made aware of helping set up red foam and then letting patient  try first for self feeding tasks. Grooming: Moderate assistance;Bed level   Upper Body Bathing: Bed level;Maximal assistance   Lower Body Bathing: Bed level;Total assistance   Upper Body Dressing : Bed level;Maximal assistance   Lower Body Dressing: Bed level;Total assistance   Toilet Transfer: +2 for physical assistance;+2 for safety/equipment Toilet Transfer Details (indicate cue type and reason): patient declined                  Vision Patient Visual Report: No change from baseline       Perception     Praxis      Pertinent Vitals/Pain Pain Assessment Pain Assessment: No/denies pain     Hand Dominance Right   Extremity/Trunk Assessment Upper Extremity Assessment Upper Extremity Assessment: RUE deficits/detail;LUE deficits/detail RUE Deficits / Details: paitent was noted to have 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 patient has ability to make complete fist with this UE. LUE Deficits / Details: patient is able to compelte about 75% of fist with this hand.   Lower Extremity Assessment Lower Extremity Assessment: Defer to PT evaluation       Communication Communication Communication: No difficulties   Cognition Arousal/Alertness: Awake/alert Behavior During Therapy: Flat affect Overall Cognitive Status: Difficult to assess                                 General Comments: patient was noted to have some garbled speech at times. patient was able to make voice very clear when asking for therapist to complete things for her. patient reported living at SNF that was "shady" education on ombudsman was provided at this time     General Comments       Exercises     Shoulder Instructions      Home Living Family/patient expects to be discharged to:: Skilled nursing facility                                        Prior Functioning/Environment Prior Level of Function : Needs assist             Mobility Comments: patient reported having a wheelchair but it is too high up that feet dont touch ground. ADLs Comments: patient reported having caregiver assist for all ADls but able to complete self feeding but messy.        OT Problem List:        OT Treatment/Interventions:      OT Goals(Current goals can be found in the care plan section) Acute Rehab OT Goals OT Goal Formulation: All assessment and education complete, DC therapy  OT Frequency:       Co-evaluation              AM-PAC OT "6 Clicks" Daily Activity     Outcome Measure Help from another person eating meals?: A Little Help from another person taking care of personal grooming?: A Little Help from another person toileting, which includes using toliet, bedpan, or urinal?: Total Help from another person bathing (including washing, rinsing, drying)?: Total Help from another person to put on and taking off regular upper body clothing?: Total Help from another person to put on and taking off regular lower body clothing?: Total 6 Click Score: 10   End of Session Equipment Utilized During Treatment: Other (comment) (red foam) Nurse Communication: Other (comment) (red foam  use and assist for self feeding)  Activity Tolerance: Patient tolerated treatment well Patient left: in bed;with call bell/phone within reach  OT Visit Diagnosis: Unsteadiness on feet (R26.81);Other abnormalities of gait and mobility (R26.89);Muscle weakness (generalized) (M62.81)                Time: 1610-9604 OT Time Calculation (min): 28 min Charges:  OT General Charges $OT Visit: 1 Visit OT Evaluation $OT Eval Low Complexity: 1 Low OT Treatments $Self Care/Home Management : 8-22 mins  Sharyn Blitz OTR/L, MS Acute Rehabilitation Department Office# [phone] Pager# [phone]   Ardyth Harps 10/27/2021, 3:50 PM

## 2021-10-27 NOTE — Progress Notes (Signed)
PT Cancellation Note  Patient Details Name: Angela Medina MRN: [id number] DOB: 29-Jun-1950   Cancelled Treatment:    Reason Eval/Treat Not Completed: Patient declined, no reason specified (pt stated she's not feeling well and doesn't want to attempt any mobility today. Will follow.)   Tamala Ser PT 10/27/2021  Acute Rehabilitation Services  Office [phone]

## 2021-10-27 NOTE — Progress Notes (Signed)
Pt had one large bowel movement, that required disimpaction, after suppository was given. Pt stated that she "feels better" after bowel movement.

## 2021-10-28 DIAGNOSIS — N179 Acute kidney failure, unspecified: Secondary | ICD-10-CM | POA: Diagnosis not present

## 2021-10-28 LAB — BASIC METABOLIC PANEL
Anion gap: 6 (ref 5–15)
BUN: 14 mg/dL (ref 8–23)
CO2: 36 mmol/L — ABNORMAL HIGH (ref 22–32)
Calcium: 8.7 mg/dL — ABNORMAL LOW (ref 8.9–10.3)
Chloride: 102 mmol/L (ref 98–111)
Creatinine, Ser: 0.43 mg/dL — ABNORMAL LOW (ref 0.44–1.00)
GFR, Estimated: >60 mL/min (ref >60)
Glucose, Bld: 110 mg/dL — ABNORMAL HIGH (ref 70–99)
Potassium: 3.3 mmol/L — ABNORMAL LOW (ref 3.5–5.1)
Sodium: 144 mmol/L (ref 135–145)

## 2021-10-28 MED ORDER — POTASSIUM CHLORIDE CRYS ER 20 MEQ PO TBCR
40.0000 meq | EXTENDED_RELEASE_TABLET | Freq: Once | ORAL | Status: AC
  Administered 2021-10-28: 40 meq via ORAL
  Filled 2021-10-28: qty 2

## 2021-10-28 MED ORDER — HYDROCODONE-ACETAMINOPHEN 5-325 MG PO TABS: 1.0000 | ORAL_TABLET | Freq: Four times a day (QID) | ORAL | 0 refills | Status: AC | PRN

## 2021-10-28 MED ORDER — GLYCERIN (LAXATIVE) 2 G RE SUPP: 1.0000 | Freq: Every day | RECTAL | 0 refills | Status: AC

## 2021-10-28 MED ORDER — ALPRAZOLAM 0.5 MG PO TABS: 0.5000 mg | ORAL_TABLET | Freq: Every evening | ORAL | 0 refills | Status: AC | PRN

## 2021-10-28 MED ORDER — MELATONIN 5 MG PO TABS
5.0000 mg | ORAL_TABLET | Freq: Every evening | ORAL | Status: DC | PRN
  Administered 2021-10-28: 5 mg via ORAL
  Filled 2021-10-28: qty 1

## 2021-10-28 NOTE — NC FL2 (Signed)
[place] MEDICAID FL2 LEVEL OF CARE SCREENING TOOL     IDENTIFICATION  Patient Name: Angela Medina Birthdate: July 18, 1950 Sex: female Admission Date (Current Location): 10/25/2021  [facility] and [place] Number:  Producer, television/film/video and Address:  [facility],  [address] [place], [place] [address]      Provider Number: [id number]  Attending Physician Name and Address:  Leatha Gilding, MD  Relative Name and Phone Number:  Irine Seal   [phone]    Current Level of Care: Hospital Recommended Level of Care: Skilled Nursing Facility Prior Approval Number:    Date Approved/Denied:   PASRR Number: [id number]  Discharge Plan: SNF    Current Diagnoses: Patient Active Problem List   Diagnosis Date Noted   Hyperkalemia 10/26/2021   Acute bilateral obstructive uropathy 10/26/2021   Hypertensive urgency 10/26/2021   Anxiety 10/26/2021   AKI (acute kidney injury) (HCC) 10/25/2021   Constipation 07/30/2021   Insomnia 07/30/2021   Cervical spondylosis with myelopathy 07/22/2021   Tobacco abuse 07/22/2021   Debility 07/22/2021   Arthritis 07/22/2021   Skin infection 05/04/2021   Homelessness unspecified 04/11/2021   Community acquired pneumonia 04/03/2021   Schizoaffective disorder (HCC) 04/02/2021   Paranoid delusion (HCC) 04/02/2021   Acute hypoxemic respiratory failure (HCC) 04/02/2021   Bronchopneumonia 04/01/2021    Orientation RESPIRATION BLADDER Height & Weight     Self, Time, Place, Situation  O2 (2L) Continent Weight: 138 lb 3.2 oz (62.7 kg) Height:  5' (152.4 cm)  BEHAVIORAL SYMPTOMS/MOOD NEUROLOGICAL BOWEL NUTRITION STATUS      Continent Diet (Regular)  AMBULATORY STATUS COMMUNICATION OF NEEDS Skin   Limited Assist Verbally PU Stage and Appropriate Care                       Personal Care Assistance Level of Assistance  Bathing, Feeding, Dressing Bathing Assistance: Limited assistance Feeding assistance: Limited  assistance Dressing Assistance: Limited assistance     Functional Limitations Info  Sight, Speech, Hearing Sight Info: Adequate Hearing Info: Adequate Speech Info: Adequate    SPECIAL CARE FACTORS FREQUENCY  PT (By licensed PT), OT (By licensed OT)     PT Frequency: Minimum 5x a week OT Frequency: Minimum 5x a week            Contractures Contractures Info: Not present    Additional Factors Info  Code Status, Allergies, Psychotropic Code Status Info: DNR Allergies Info: Methotrexate Derivatives   Penicillins Psychotropic Info: ALPRAZolam (XANAX) tablet 0.5 mg, OLANZapine (ZYPREXA) tablet 15 mg         Current Medications (10/28/2021):  This is the current hospital active medication list Current Facility-Administered Medications  Medication Dose Route Frequency Provider Last Rate Last Admin   (feeding supplement) PROSource Plus liquid 30 mL  30 mL Oral BID Mansy, Jan A, MD   30 mL at 10/28/21 0835   acetaminophen (TYLENOL) tablet 650 mg  650 mg Oral Q6H PRN Mansy, Jan A, MD   650 mg at 10/27/21 2032   Or   acetaminophen (TYLENOL) suppository 650 mg  650 mg Rectal Q6H PRN Mansy, Jan A, MD       albuterol (PROVENTIL) (2.5 MG/3ML) 0.083% nebulizer solution 2.5 mg  2.5 mg Nebulization Q6H PRN Pokhrel, Laxman, MD       ALPRAZolam Prudy Feeler) tablet 0.5 mg  0.5 mg Oral TID Pokhrel, Laxman, MD   0.5 mg at 10/28/21 0835   amLODipine (NORVASC) tablet 2.5 mg  2.5 mg Oral  Daily Pokhrel, Laxman, MD   2.5 mg at 10/28/21 0835   Chlorhexidine Gluconate Cloth 2 % PADS 6 each  6 each Topical Q0600 Leatha Gilding, MD   6 each at 10/28/21 0524   clobetasol ointment (TEMOVATE) 0.05 %   Topical BID Pokhrel, Laxman, MD   Given at 10/27/21 2112   diclofenac Sodium (VOLTAREN) 1 % topical gel 2 g  2 g Topical Q8H PRN Pokhrel, Laxman, MD       enoxaparin (LOVENOX) injection 40 mg  40 mg Subcutaneous Q24H Leatha Gilding, MD   40 mg at 10/28/21 9923   Glycerin (Adult) 2 g suppository 1 suppository   1 suppository Rectal Daily Leatha Gilding, MD   1 suppository at 10/28/21 4144   HYDROcodone-acetaminophen (NORCO/VICODIN) 5-325 MG per tablet 1 tablet  1 tablet Oral Q6H PRN Pokhrel, Laxman, MD   1 tablet at 10/28/21 0317   magnesium hydroxide (MILK OF MAGNESIA) suspension 30 mL  30 mL Oral Daily PRN Mansy, Jan A, MD       melatonin tablet 5 mg  5 mg Oral QHS PRN Omelia Blackwater K, NP   5 mg at 10/28/21 0317   methocarbamol (ROBAXIN) tablet 500 mg  500 mg Oral Q8H PRN Pokhrel, Laxman, MD   500 mg at 10/26/21 1830   OLANZapine (ZYPREXA) tablet 15 mg  15 mg Oral QHS Pokhrel, Laxman, MD   15 mg at 10/27/21 2111   ondansetron (ZOFRAN) tablet 4 mg  4 mg Oral Q6H PRN Mansy, Jan A, MD       Or   ondansetron [facility]) injection 4 mg  4 mg Intravenous Q6H PRN Mansy, Jan A, MD       polyethylene glycol (MIRALAX / GLYCOLAX) packet 17 g  17 g Oral BID Pokhrel, Laxman, MD   17 g at 10/28/21 0835   potassium chloride SA (KLOR-CON M) CR tablet 40 mEq  40 mEq Oral Once Leatha Gilding, MD       senna-docusate (Senokot-S) tablet 2 tablet  2 tablet Oral BID Pokhrel, Laxman, MD   2 tablet at 10/28/21 0835   zolpidem (AMBIEN) tablet 5 mg  5 mg Oral QHS PRN Pokhrel, Laxman, MD   5 mg at 10/28/21 0010     Discharge Medications: Please see discharge summary for a list of discharge medications.  Relevant Imaging Results:  Relevant Lab Results:   Additional Information SSN [id number]  Darleene Cleaver, LCSW

## 2021-10-28 NOTE — Discharge Summary (Signed)
Physician Discharge Summary  Angela Medina ZYY:[id number] DOB: [birth year] DOA: 10/25/2021  PCP: System, Provider Not In  Admit date: 10/25/2021 Discharge date: 10/28/2021  Admitted From: SNF Disposition:  SNF  Recommendations for Outpatient Follow-up:  Follow up with PCP in 1-2 weeks Please obtain BMP/CBC in one week  Home Health: none Equipment/Devices: none  Discharge Condition: stable CODE STATUS: DNR Diet Orders (From admission, onward)     Start     Ordered   10/26/21 0114  Diet Heart Room service appropriate? Yes; Fluid consistency: Thin  Diet effective now       Question Answer Comment  Room service appropriate? Yes   Fluid consistency: Thin      10/26/21 0115            HPI: Per admitting MD, Angela Medina is a 71 y.o. female with medical history significant for depression and schizoaffective disorder, who presented to the emergency room with acute onset of abdominal pain with associated distention and diminished urine output over the last couple of days.  She has been getting progressively weak.  She has a history of cervical spinal stenosis for which she has refused surgery in the past.  No nausea or vomiting or diarrhea.  She has been constipated.  She admits to occasional cough productive of yellowish sputum without dyspnea or wheezing.  Hospital Course / Discharge diagnoses: Principal Problem:   AKI (acute kidney injury) (HCC) Active Problems:   Acute bilateral obstructive uropathy   Hyperkalemia   Schizoaffective disorder (HCC)   Anxiety   Cervical spondylosis with myelopathy   Hypertensive urgency  Principal problem Acute kidney injury-due to urinary retention.  Underwent catheter placement by urology on 7/6.  Renal function has now normalized, urology saw patient and recommending discharge with a Foley catheter and further outpatient work-up in urology office.  Please ensure follow-up within the next 1 to 2 weeks with Dr. Jettie Pagan with [facility]   Active problems Neck pain, back pain, cervical spondylosis with myelopathy-nonambulatory, currently wheelchair-bound.  On Neurontin.  She has seen neurosurgery in the past and per notes has refused surgery.  Continue rehab as able  Hyperkalemia-received Lokelma, with improvement in her renal function potassium has improved Schizoaffective disorder, anxiety-continue Zyprexa, Xanax Hypertension-due to pain, improved Constipation-chronic, add daily suppositories as it seems to help Debility, deconditioning-from SNF  Sepsis ruled out   Discharge Instructions   Allergies as of 10/28/2021       Reactions   Methotrexate Derivatives Other (See Comments)   Hair  Fall  out   Penicillins Other (See Comments)   Unknown- childhood allergy        Medication List     TAKE these medications    acetaminophen 500 MG tablet Commonly known as: TYLENOL Take 1,000 mg by mouth every 6 (six) hours as needed for moderate pain or headache.   albuterol (2.5 MG/3ML) 0.083% nebulizer solution Commonly known as: PROVENTIL Take 3 mLs (2.5 mg total) by nebulization every 6 (six) hours as needed for wheezing or shortness of breath.   ALPRAZolam 0.5 MG tablet Commonly known as: XANAX Take 1 tablet (0.5 mg total) by mouth at bedtime as needed for anxiety. What changed:  when to take this reasons to take this   amLODipine 2.5 MG tablet Commonly known as: NORVASC Take 1 tablet (2.5 mg total) by mouth daily.   bisacodyl 5 MG EC tablet Commonly known as: DULCOLAX Take 5 mg by mouth daily as needed for moderate  constipation.   clobetasol ointment 0.05 % Commonly known as: TEMOVATE Apply topically 2 (two) times daily.   diclofenac Sodium 1 % Gel Commonly known as: VOLTAREN Apply 2 g topically every 8 (eight) hours as needed (pain).   feeding supplement (PRO-STAT SUGAR FREE 64) Liqd Take 30 mLs by mouth in the morning and at bedtime.   gabapentin 100 MG capsule Commonly known as:  NEURONTIN Take 1 capsule (100 mg total) by mouth 3 (three) times daily.   Glycerin (Adult) 2 g Supp Place 1 suppository rectally daily. Start taking on: October 29, 2021   HYDROcodone-acetaminophen 5-325 MG tablet Commonly known as: NORCO/VICODIN Take 1 tablet by mouth every 6 (six) hours as needed for moderate pain.   melatonin 5 MG Tabs Take 1 tablet (5 mg total) by mouth daily at 6 PM.   methocarbamol 500 MG tablet Commonly known as: ROBAXIN Take 1 tablet (500 mg total) by mouth every 8 (eight) hours as needed for muscle spasms.   OLANZapine 15 MG tablet Commonly known as: ZYPREXA Take 15 mg by mouth at bedtime. What changed: Another medication with the same name was removed. Continue taking this medication, and follow the directions you see here.   polyethylene glycol 17 g packet Commonly known as: MIRALAX / GLYCOLAX Take 17 g by mouth daily.   senna-docusate 8.6-50 MG tablet Commonly known as: Senokot-S Take 2 tablets by mouth 2 (two) times daily.   zolpidem 10 MG tablet Commonly known as: AMBIEN Take 10 mg by mouth at bedtime.        Follow-up Information     Jannifer Hick, MD Follow up in 1 week(s).   Specialty: Urology Contact information: [address] [place] [address] [phone]                 Consultations: Urology   Procedures/Studies:  US RENAL  Result Date: 11/06/21 CLINICAL DATA:  Urinary retention. EXAM: RENAL / URINARY TRACT ULTRASOUND COMPLETE COMPARISON:  CT scan 10/25/2021 FINDINGS: Right Kidney: Renal measurements: 11.7 x 5.1 x 6.0 cm = volume: 186 mL. Echogenicity within normal limits. No mass or hydronephrosis visualized. Left Kidney: Renal measurements: 10.8 x 6.7 x 5.8 cm = volume: 218 mL. Echogenicity within normal limits. No mass or hydronephrosis visualized. Bladder: Decompressed by Foley catheter. Other: Left pleural effusion evident. IMPRESSION: 1. No evidence of hydronephrosis. 2. Left pleural effusion.  Electronically Signed   By: Kennith Center M.D.   On: Nov 06, 2021 13:44   DG Chest Port 1 View  Result Date: 10/25/2021 CLINICAL DATA:  Shortness of breath EXAM: PORTABLE CHEST 1 VIEW COMPARISON:  07/18/2021 FINDINGS: Cardiac shadow is mildly enlarged but stable. Lungs are well aerated. Elevation of left hemidiaphragm is seen. No focal infiltrate is noted. No bony abnormality is seen. IMPRESSION: No acute abnormality noted. Electronically Signed   By: Alcide Clever M.D.   On: 10/25/2021 23:08   CT Thoracic Spine Wo Contrast  Result Date: 10/25/2021 CLINICAL DATA:  Neck and back pain.  Possible trauma EXAM: CT THORACIC SPINE WITHOUT CONTRAST TECHNIQUE: Multidetector CT images of the thoracic were obtained using the standard protocol without intravenous contrast. RADIATION DOSE REDUCTION: This exam was performed according to the departmental dose-optimization program which includes automated exposure control, adjustment of the mA and/or kV according to patient size and/or use of iterative reconstruction technique. COMPARISON:  CT chest 04/01/2021 FINDINGS: Alignment: Normal alignment. 30 degrees of kyphosis centered at the chronic T8 fracture. Vertebrae: Severe fracture of T8 is chronic  and unchanged from the prior CT. There appears to be ankylosis of T7 through T9. No acute fracture or mass. Paraspinal and other soft tissues: No paraspinous mass. Coronary artery calcification Visualized lungs are clear. Disc levels: Advanced spondylosis in the cervical spine. Disc degeneration and mild spurring at T2-3 without stenosis. No spinal stenosis in the thoracic spine IMPRESSION: Severe chronic fracture of T8.  No acute thoracic fracture 30 degrees of kyphosis related to T8 fracture No significant thoracic spinal stenosis. Electronically Signed   By: Marlan Palau M.D.   On: 10/25/2021 19:11   CT Abdomen Pelvis Wo Contrast  Result Date: 10/25/2021 CLINICAL DATA:  Acute abdominal pain with findings suggestive of  constipation EXAM: CT ABDOMEN AND PELVIS WITHOUT CONTRAST TECHNIQUE: Multidetector CT imaging of the abdomen and pelvis was performed following the standard protocol without IV contrast. RADIATION DOSE REDUCTION: This exam was performed according to the departmental dose-optimization program which includes automated exposure control, adjustment of the mA and/or kV according to patient size and/or use of iterative reconstruction technique. COMPARISON:  None Available. FINDINGS: Lower chest: Elevation of left hemidiaphragm is noted. A few scattered calcified granulomas are noted. Right basilar atelectasis is seen. Hepatobiliary: No focal liver abnormality is seen. No gallstones, gallbladder wall thickening, or biliary dilatation. Pancreas: Unremarkable. No pancreatic ductal dilatation or surrounding inflammatory changes. Spleen: Normal in size without focal abnormality. Adrenals/Urinary Tract: Adrenal glands are well visualized. A small 11 mm hypodense lesion is noted better visualized than on prior exams. It measures 1 Hounsfield unit. Kidneys are well visualized bilaterally. No renal calculi are seen. Bilateral hydronephrosis is noted secondary to a significantly over distended bladder. It measures approximately 24 cm in craniocaudad projection. Stomach/Bowel: Fecal material is noted throughout the colon and somewhat prominent in the rectal vault likely related to a degree of rectal impaction. The more proximal colon shows some fecal material within although no significant constipation is noted. The difficulty moving the patient's bowels may be related to the over distended bladder. The appendix is well visualized and within normal limits. Small bowel is displaced secondary to the distended bladder. No obstructive changes are seen. Stomach is within normal limits. Vascular/Lymphatic: Aortic atherosclerosis. No enlarged abdominal or pelvic lymph nodes. Reproductive: Uterus and bilateral adnexa are unremarkable.  Other: No ascites is noted. Fat containing umbilical hernia is seen measuring 6.6 cm in greatest transverse dimension. Musculoskeletal: Degenerative changes of lumbar spine are noted. Scoliosis concave to the left is noted. IMPRESSION: Significantly over distended bladder with associated bilateral hydronephrosis. Fat containing umbilical hernia. Findings suspicious for rectal impaction although no wall thickening is seen. The difficulty moving bowels may be related to the over distended bladder causing extrinsic compression upon the colon. 11 mm left adrenal lesion is noted which measures 1 Hounsfield unit. 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 Electronically Signed   By: Alcide Clever M.D.   On: 10/25/2021 19:10   CT Head Wo Contrast  Result Date: 10/25/2021 CLINICAL DATA:  Acute neuro deficit. Neck and back pain. Possible trauma EXAM: CT HEAD WITHOUT CONTRAST CT CERVICAL SPINE WITHOUT CONTRAST TECHNIQUE: Multidetector CT imaging of the head and cervical spine was performed following the standard protocol without intravenous contrast. Multiplanar CT image reconstructions of the cervical spine were also generated. RADIATION DOSE REDUCTION: This exam was performed according to the departmental dose-optimization program which includes automated exposure control, adjustment of the mA and/or kV according to patient size and/or use of iterative  reconstruction technique. COMPARISON:  CT head 07/17/2021 FINDINGS: CT HEAD FINDINGS Brain: Ventricle size normal. Patchy white matter hypodensity bilaterally consistent with chronic microvascular ischemia. Negative for acute infarct, hemorrhage, mass. Vascular: Aneurysm coiling reason right posterior communicating artery. Negative for hyper dense vessel Skull: Negative Sinuses/Orbits: Negative Other: None CT CERVICAL SPINE FINDINGS Alignment: Mild anterolisthesis C2-3. 5 mm anterolisthesis C3-4 unchanged. 3 mm  anterolisthesis C4-5 unchanged. Mild retrolisthesis C5-6, C6-7. Mild anterolisthesis C7-T1 unchanged. Skull base and vertebrae: Negative for cervical spine fracture Soft tissues and spinal canal: Negative for soft tissue mass or edema in the neck. Disc levels: Advanced disc and facet degeneration throughout the entire cervical spine. Spinal stenosis most prominent at C3-4. Multilevel foraminal encroachment. Upper chest: Lung apices clear bilaterally Other: None IMPRESSION: 1. White matter ischemia appears chronic. No acute intracranial abnormality. 2. 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 No acute intracranial hemorrhage 3. Advanced cervical spondylosis.  Negative for fracture. Electronically Signed   By: Marlan Palau M.D.   On: 10/25/2021 19:06   CT Cervical Spine Wo Contrast  Result Date: 10/25/2021 CLINICAL DATA:  Acute neuro deficit. Neck and back pain. Possible trauma EXAM: CT HEAD WITHOUT CONTRAST CT CERVICAL SPINE WITHOUT CONTRAST TECHNIQUE: Multidetector CT imaging of the head and cervical spine was performed following the standard protocol without intravenous contrast. Multiplanar CT image reconstructions of the cervical spine were also generated. RADIATION DOSE REDUCTION: This exam was performed according to the departmental dose-optimization program which includes automated exposure control, adjustment of the mA and/or kV according to patient size and/or use of iterative reconstruction technique. COMPARISON:  CT head 07/17/2021 FINDINGS: CT HEAD FINDINGS Brain: Ventricle size normal. Patchy white matter hypodensity bilaterally consistent with chronic microvascular ischemia. Negative for acute infarct, hemorrhage, mass. Vascular: Aneurysm coiling reason right posterior communicating artery. Negative for hyper dense vessel Skull: Negative Sinuses/Orbits: Negative Other: None CT CERVICAL SPINE FINDINGS Alignment: Mild anterolisthesis C2-3. 5 mm anterolisthesis C3-4  unchanged. 3 mm anterolisthesis C4-5 unchanged. Mild retrolisthesis C5-6, C6-7. Mild anterolisthesis C7-T1 unchanged. Skull base and vertebrae: Negative for cervical spine fracture Soft tissues and spinal canal: Negative for soft tissue mass or edema in the neck. Disc levels: Advanced disc and facet degeneration throughout the entire cervical spine. Spinal stenosis most prominent at C3-4. Multilevel foraminal encroachment. Upper chest: Lung apices clear bilaterally Other: None IMPRESSION: 1. White matter ischemia appears chronic. No acute intracranial abnormality. 2. 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 No acute intracranial hemorrhage 3. Advanced cervical spondylosis.  Negative for fracture. Electronically Signed   By: Marlan Palau M.D.   On: 10/25/2021 19:06     Subjective: - no chest pain, shortness of breath, no abdominal pain, nausea or vomiting.   Discharge Exam: BP (!) 144/81   Pulse 81   Temp 98.9 F (37.2 C) (Oral)   Resp (!) 25   Ht 5' (1.524 m)   Wt 62.7 kg   SpO2 94%   BMI 26.99 kg/m   General: Pt is alert, awake, not in acute distress Cardiovascular: RRR, S1/S2 +, no rubs, no gallops Respiratory: CTA bilaterally, no wheezing, no rhonchi Abdominal: Soft, NT, ND, bowel sounds + Extremities: no edema, no cyanosis   The results of significant diagnostics from this hospitalization (including imaging, microbiology, ancillary and laboratory) are listed below for reference.     Microbiology: No results found for this or any previous visit (from the past 240 hour(s)).   Labs: Basic Metabolic Panel: Recent  Labs  Lab 10/25/21 1443 10/26/21 0408 10/27/21 0805 10/28/21 0554  NA 132* 135 141 144  K 5.7* 4.8 3.6 3.3*  CL 91* 95* 102 102  CO2 28 28 32 36*  GLUCOSE 121* 84 102* 110*  BUN 71* 67* 34* 14  CREATININE 2.26* 1.87* 0.62 0.43*  CALCIUM 9.0 8.5* 8.4* 8.7*  MG  --   --  2.1  --    Liver Function Tests: Recent Labs  Lab  10/25/21 1443  AST 21  ALT 14  ALKPHOS 65  BILITOT 1.2  PROT 7.3  ALBUMIN 3.7   CBC: Recent Labs  Lab 10/25/21 1443 10/26/21 0408 10/27/21 0805  WBC 11.2* 9.7 13.4*  NEUTROABS 7.3  --   --   HGB 12.9 11.3* 11.0*  HCT 38.5 35.5* 34.4*  MCV 96.0 99.2 101.2*  PLT 331 279 301   CBG: No results for input(s): "GLUCAP" in the last 168 hours. Hgb A1c No results for input(s): "HGBA1C" in the last 72 hours. Lipid Profile No results for input(s): "CHOL", "HDL", "LDLCALC", "TRIG", "CHOLHDL", "LDLDIRECT" in the last 72 hours. Thyroid function studies No results for input(s): "TSH", "T4TOTAL", "T3FREE", "THYROIDAB" in the last 72 hours.  Invalid input(s): "FREET3" Urinalysis    Component Value Date/Time   COLORURINE YELLOW 10/25/2021 2149   APPEARANCEUR CLEAR 10/25/2021 2149   LABSPEC 1.011 10/25/2021 2149   PHURINE 5.0 10/25/2021 2149   GLUCOSEU NEGATIVE 10/25/2021 2149   HGBUR MODERATE (A) 10/25/2021 2149   BILIRUBINUR NEGATIVE 10/25/2021 2149   KETONESUR NEGATIVE 10/25/2021 2149   PROTEINUR NEGATIVE 10/25/2021 2149   NITRITE NEGATIVE 10/25/2021 2149   LEUKOCYTESUR NEGATIVE 10/25/2021 2149    FURTHER DISCHARGE INSTRUCTIONS:   Get Medicines reviewed and adjusted: Please take all your medications with you for your next visit with your Primary MD   Laboratory/radiological data: Please request your Primary MD to go over all hospital tests and procedure/radiological results at the follow up, please ask your Primary MD to get all Hospital records sent to his/her office.   In some cases, they will be blood work, cultures and biopsy results pending at the time of your discharge. Please request that your primary care M.D. goes through all the records of your hospital data and follows up on these results.   Also Note the following: If you experience worsening of your admission symptoms, develop shortness of breath, life threatening emergency, suicidal or homicidal thoughts you  must seek medical attention immediately by calling 911 or calling your MD immediately  if symptoms less severe.   You must read complete instructions/literature along with all the possible adverse reactions/side effects for all the Medicines you take and that have been prescribed to you. Take any new Medicines after you have completely understood and accpet all the possible adverse reactions/side effects.    Do not drive when taking Pain medications or sleeping medications (Benzodaizepines)   Do not take more than prescribed Pain, Sleep and Anxiety Medications. It is not advisable to combine anxiety,sleep and pain medications without talking with your primary care practitioner   Special Instructions: If you have smoked or chewed Tobacco  in the last 2 yrs please stop smoking, stop any regular Alcohol  and or any Recreational drug use.   Wear Seat belts while driving.   Please note: You were cared for by a hospitalist during your hospital stay. Once you are discharged, your primary care physician will handle any further medical issues. Please note that NO REFILLS for any discharge  medications will be authorized once you are discharged, as it is imperative that you return to your primary care physician (or establish a relationship with a primary care physician if you do not have one) for your post hospital discharge needs so that they can reassess your need for medications and monitor your lab values.  Time coordinating discharge: 40 minutes  SIGNED:  Pamella Pert, MD, PhD 10/28/2021, 9:09 AM

## 2021-10-28 NOTE — Plan of Care (Signed)

## 2021-10-28 NOTE — TOC Transition Note (Addendum)
Transition of Care [facility]) - CM/SW Discharge Note   Patient Details  Name: Angela Medina MRN: [id number] Date of Birth: [birth year]  [facility] CM/SW Contact:  Darleene Cleaver, LCSW Phone Number: 10/28/2021, 11:33 AM   Clinical Narrative:     Patient to be d/c'ed today to [facility] ([facility]  Patient and family agreeable to plans will transport via ems RN to call report to room 156A, [phone] [facility] nurse.  CSW updated patient's sister Marylene Land who is aware that she is discharging today.     Final next level of care: Skilled Nursing Facility Barriers to Discharge: Barriers Resolved   Patient Goals and CMS Choice Patient states their goals for this hospitalization and ongoing recovery are:: To return back to [facility]. CMS Medicare.gov Compare Post Acute Care list provided to:: Patient Represenative (must comment)    Discharge Placement   Existing PASRR number confirmed : 10/28/21          Patient chooses bed at: Other - please specify in the comment section below: [facility] ([facility] Patient to be transferred to facility by: [facility] Name of family member notified: Sister [name] [phone] Patient and family notified of of transfer: 10/28/21  Discharge Plan and Services                                     Social Determinants of Health (SDOH) Interventions     Readmission Risk Interventions     No data to display

## 2021-10-28 NOTE — Plan of Care (Signed)
Report Called to [employer] spoke to Alexia Freestone, RN and reviewed AVS Problem: Education: Goal: Knowledge of General Education information will improve Description: Including pain rating scale, medication(s)/side effects and non-pharmacologic comfort measures 10/28/2021 1249 by Arcelia Jew, RN Outcome: Adequate for Discharge 10/28/2021 1130 by Arcelia Jew, RN Outcome: Adequate for Discharge   Problem: Health Behavior/Discharge Planning: Goal: Ability to manage health-related needs will improve 10/28/2021 1249 by Arcelia Jew, RN Outcome: Adequate for Discharge 10/28/2021 1130 by Arcelia Jew, RN Outcome: Adequate for Discharge   Problem: Clinical Measurements: Goal: Ability to maintain clinical measurements within normal limits will improve 10/28/2021 1249 by Arcelia Jew, RN Outcome: Adequate for Discharge 10/28/2021 1130 by Arcelia Jew, RN Outcome: Adequate for Discharge Goal: Will remain free from infection 10/28/2021 1249 by Arcelia Jew, RN Outcome: Adequate for Discharge 10/28/2021 1130 by Arcelia Jew, RN Outcome: Adequate for Discharge Goal: Diagnostic test results will improve 10/28/2021 1249 by Arcelia Jew, RN Outcome: Adequate for Discharge 10/28/2021 1130 by Arcelia Jew, RN Outcome: Adequate for Discharge Goal: Respiratory complications will improve 10/28/2021 1249 by Arcelia Jew, RN Outcome: Adequate for Discharge 10/28/2021 1130 by Arcelia Jew, RN Outcome: Adequate for Discharge Goal: Cardiovascular complication will be avoided 10/28/2021 1249 by Arcelia Jew, RN Outcome: Adequate for Discharge 10/28/2021 1130 by Arcelia Jew, RN Outcome: Adequate for Discharge   Problem: Activity: Goal: Risk for activity intolerance will decrease 10/28/2021 1249 by Arcelia Jew, RN Outcome: Adequate for Discharge 10/28/2021 1130 by Arcelia Jew, RN Outcome: Adequate for Discharge   Problem: Nutrition: Goal: Adequate nutrition will be  maintained 10/28/2021 1249 by Arcelia Jew, RN Outcome: Adequate for Discharge 10/28/2021 1130 by Arcelia Jew, RN Outcome: Adequate for Discharge   Problem: Coping: Goal: Level of anxiety will decrease 10/28/2021 1249 by Arcelia Jew, RN Outcome: Adequate for Discharge 10/28/2021 1130 by Arcelia Jew, RN Outcome: Adequate for Discharge   Problem: Elimination: Goal: Will not experience complications related to bowel motility 10/28/2021 1249 by Arcelia Jew, RN Outcome: Adequate for Discharge 10/28/2021 1130 by Arcelia Jew, RN Outcome: Adequate for Discharge Goal: Will not experience complications related to urinary retention 10/28/2021 1249 by Arcelia Jew, RN Outcome: Adequate for Discharge 10/28/2021 1130 by Arcelia Jew, RN Outcome: Adequate for Discharge   Problem: Pain Managment: Goal: General experience of comfort will improve 10/28/2021 1249 by Arcelia Jew, RN Outcome: Adequate for Discharge 10/28/2021 1130 by Arcelia Jew, RN Outcome: Adequate for Discharge   Problem: Safety: Goal: Ability to remain free from injury will improve 10/28/2021 1249 by Arcelia Jew, RN Outcome: Adequate for Discharge 10/28/2021 1130 by Arcelia Jew, RN Outcome: Adequate for Discharge   Problem: Skin Integrity: Goal: Risk for impaired skin integrity will decrease 10/28/2021 1249 by Arcelia Jew, RN Outcome: Adequate for Discharge 10/28/2021 1130 by Arcelia Jew, RN Outcome: Adequate for Discharge

## 2021-10-28 NOTE — Plan of Care (Signed)

## 2021-11-20 DEATH — deceased

## 2024-01-09 IMAGING — CR DG HIP (WITH OR WITHOUT PELVIS) 1V PORT*L*
4 series · 4 of 4 positions shown · non-contrast
Comparison: 07/13/2021

CLINICAL DATA: Left hip pain

EXAM:
DG HIP (WITH OR WITHOUT PELVIS) 1V PORT LEFT

[pelvis ap (1 of 2)]
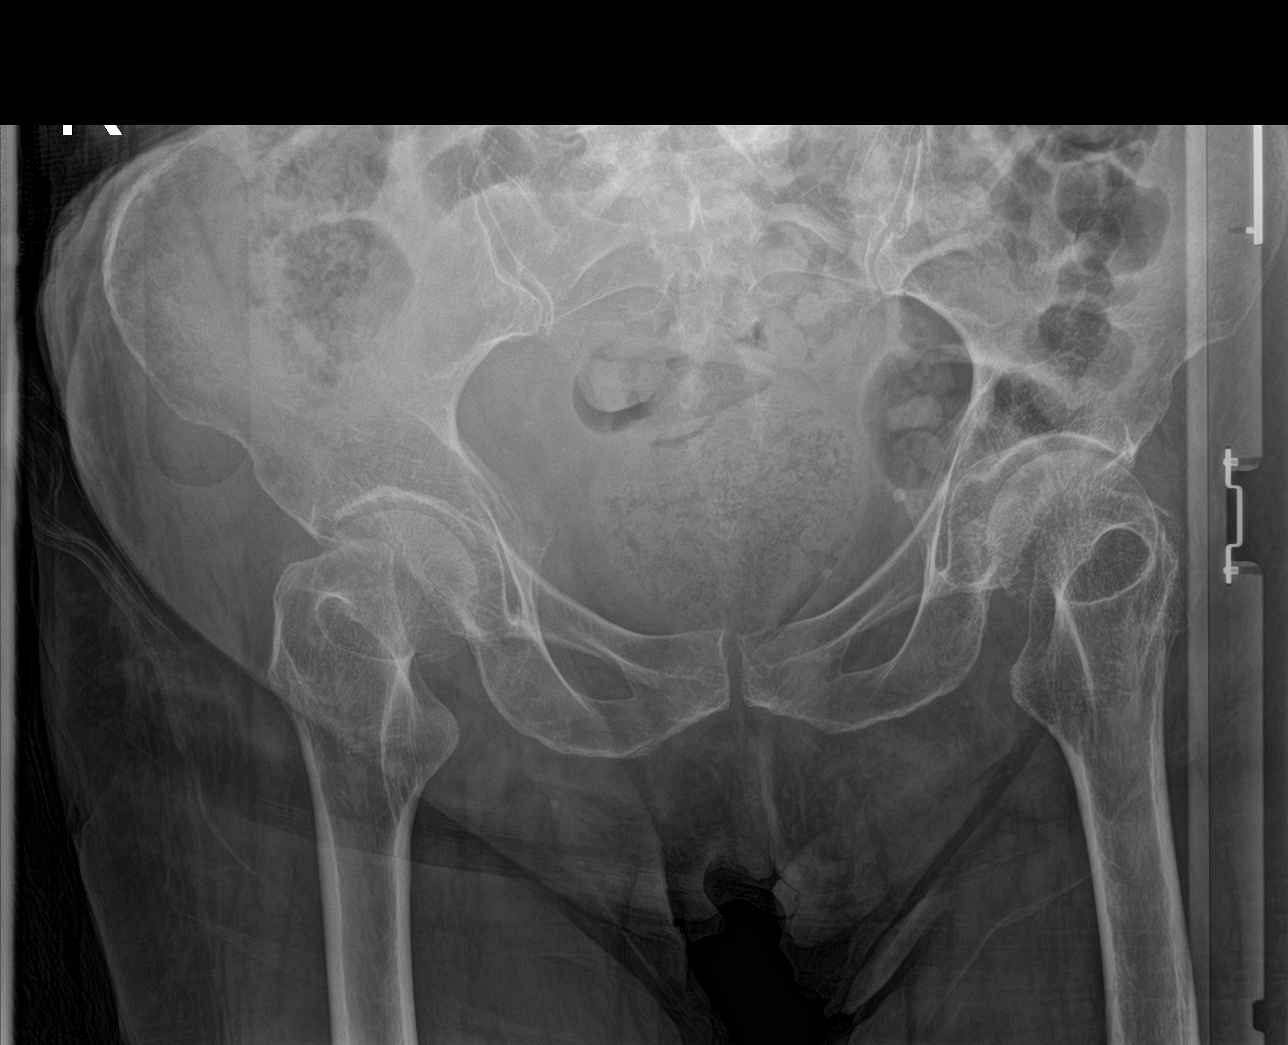

[hip ap]
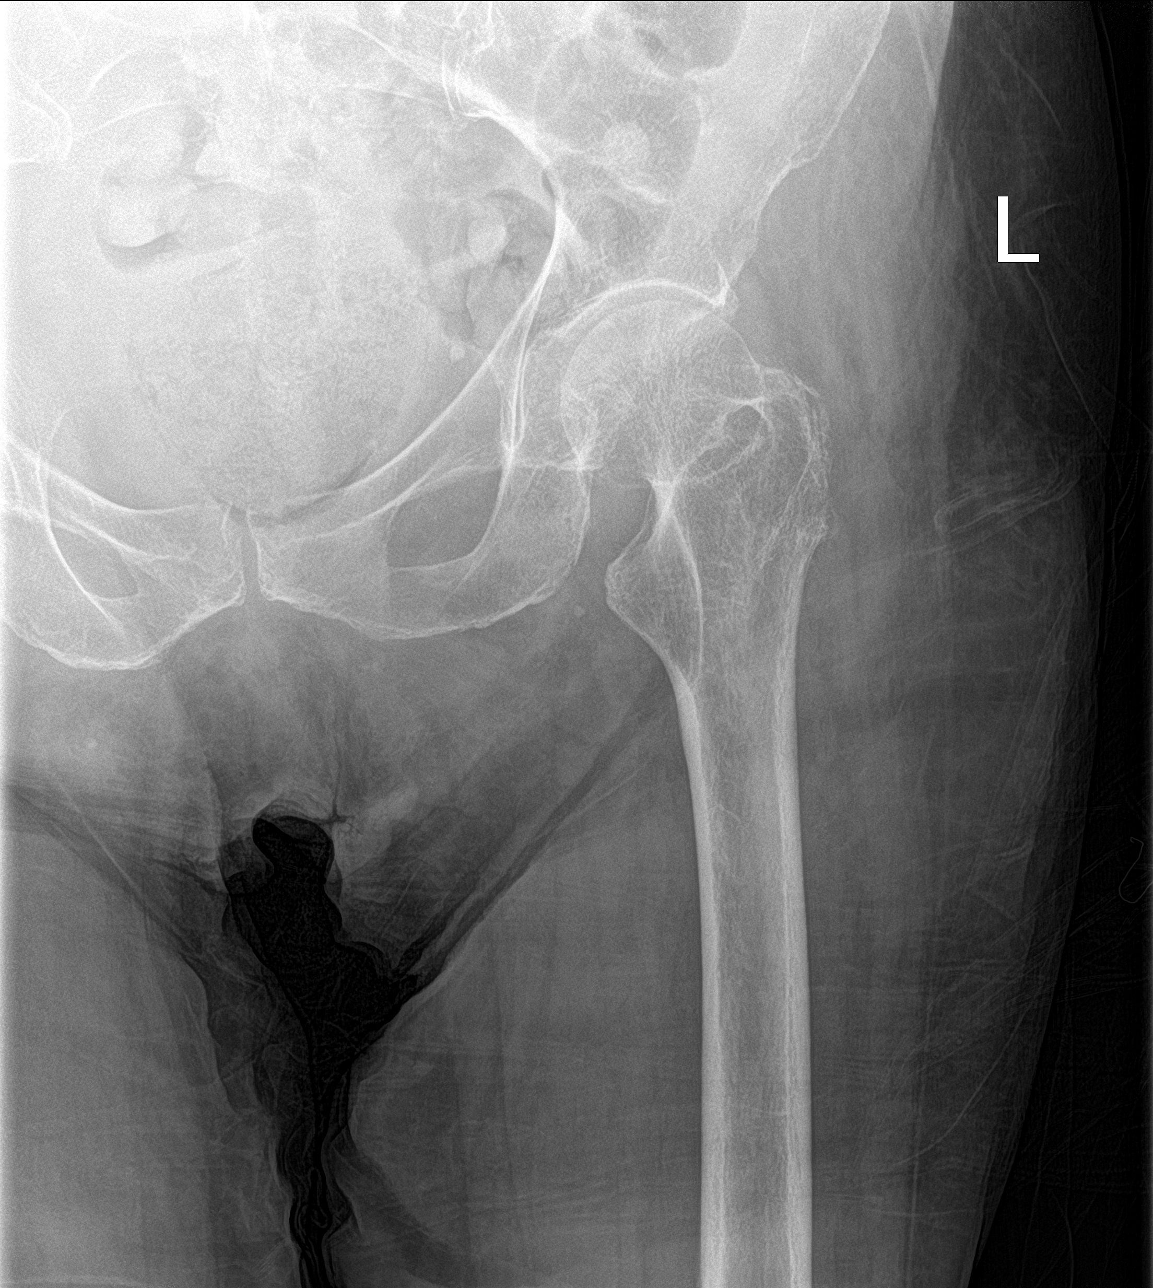

[hip lat]
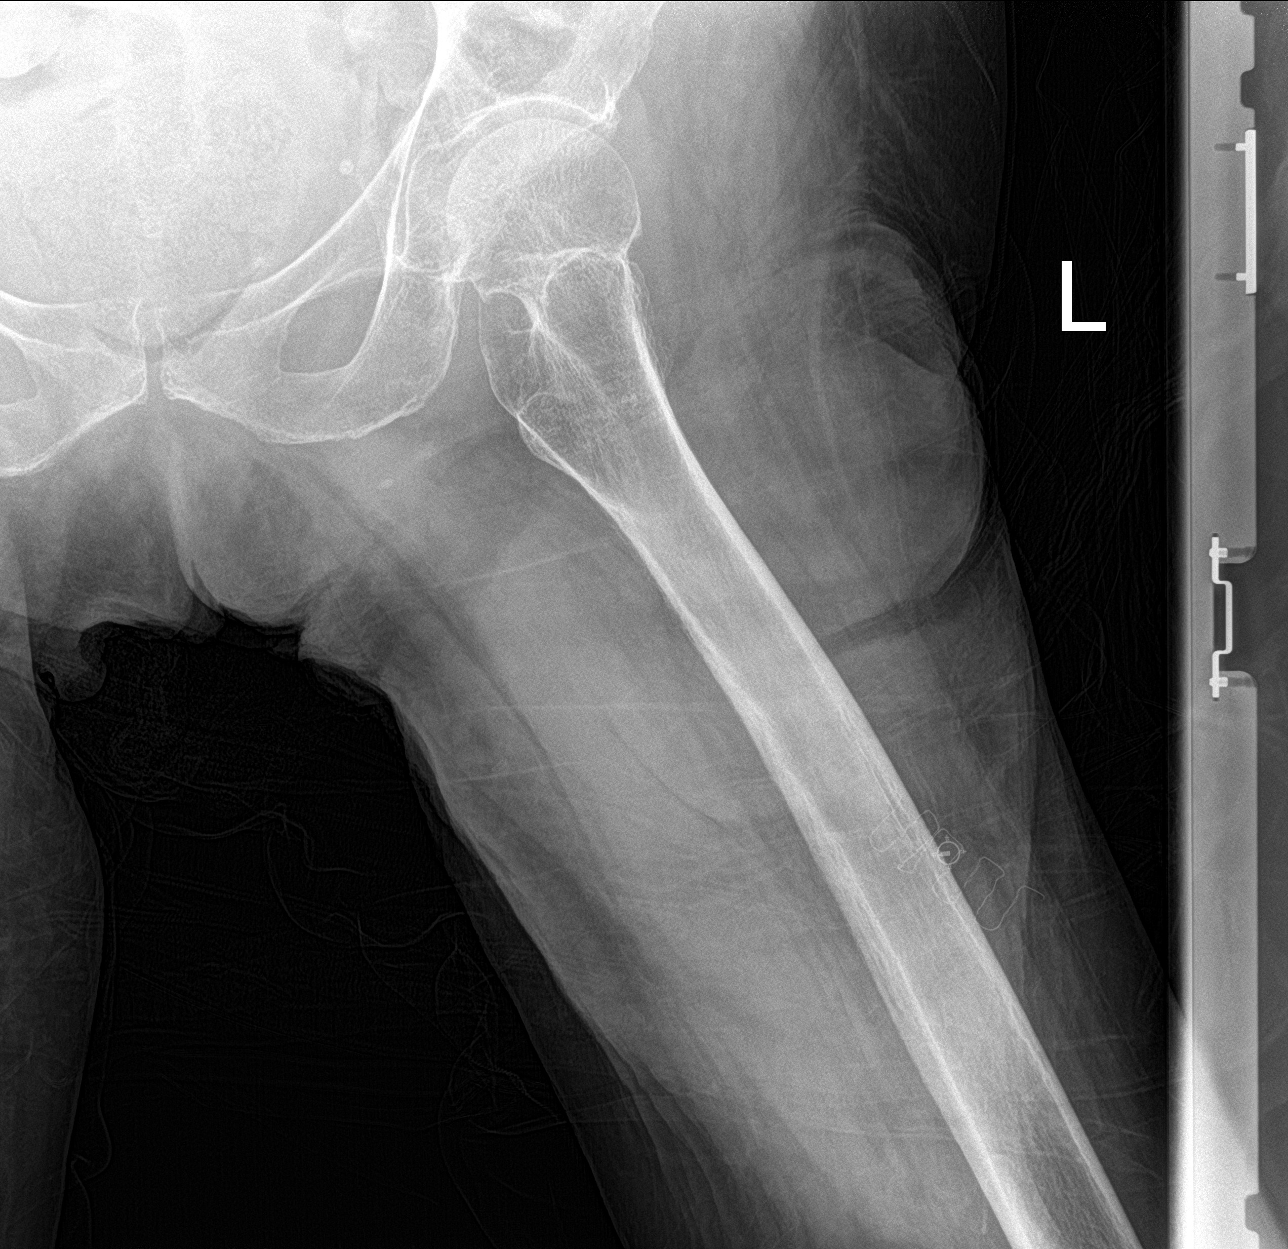

[pelvis ap (2 of 2)]
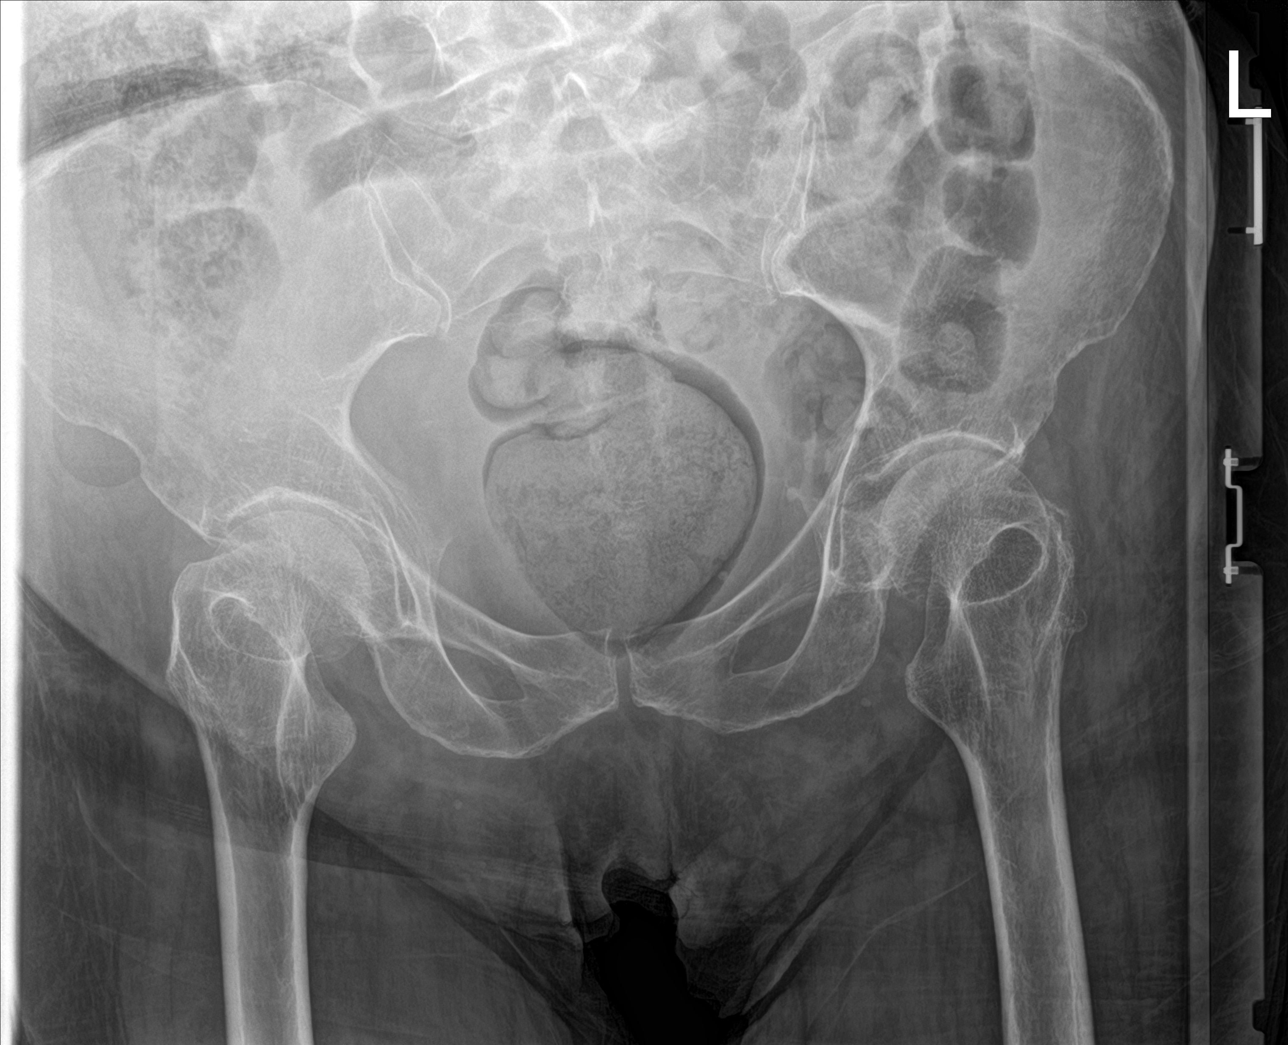

[4 of 4 positions shown; findings below may reference images not displayed]

FINDINGS: Bilateral hips are internally rotated, limiting evaluation.

No fracture or dislocation is seen.

Bilateral hip joint spaces are preserved.

Visualized bony pelvis appears intact.
IMPRESSION: Negative.

## 2024-01-09 IMAGING — CT CT L SPINE W/O CM
3 of 4 series · 11 of 33 positions shown, 13 images · non-contrast
Comparison: None Available.

CLINICAL DATA: Low back pain with decreased left lower extremity
sensation



[Series 4: l spine 2.0 bone · axial · 0.26mm/px · z∈[+883,+1043]mm · 3 of 120 slices shown, 4 images]
[im 20/120  soft-tissue]
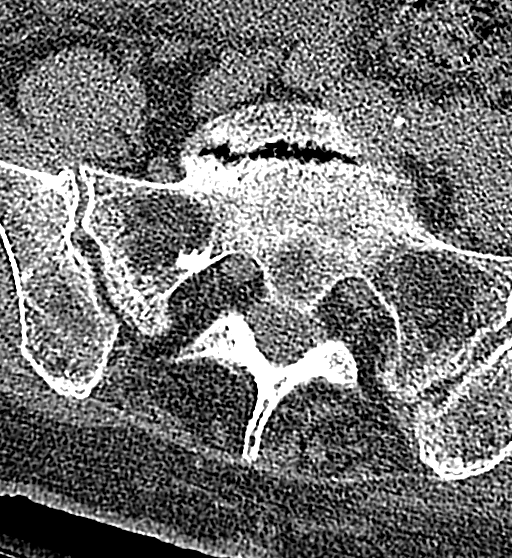
[im 20/120  bone]
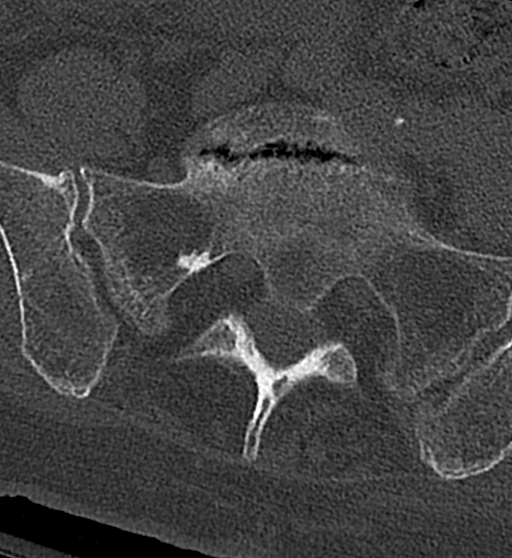
[im 60/120  bone]
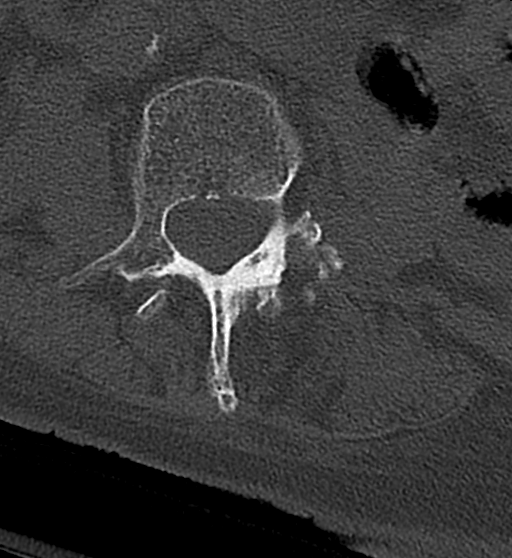
[im 100/120  bone]
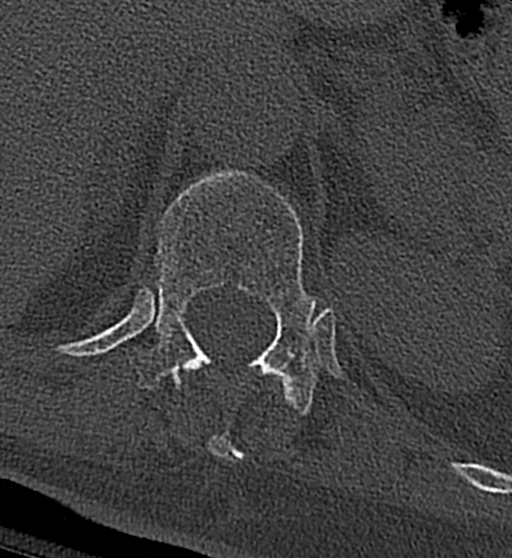

[Series 6: coronal st · coronal · 0.29mm/px · 3 of 72 slices shown]
[im 15/72  bone]
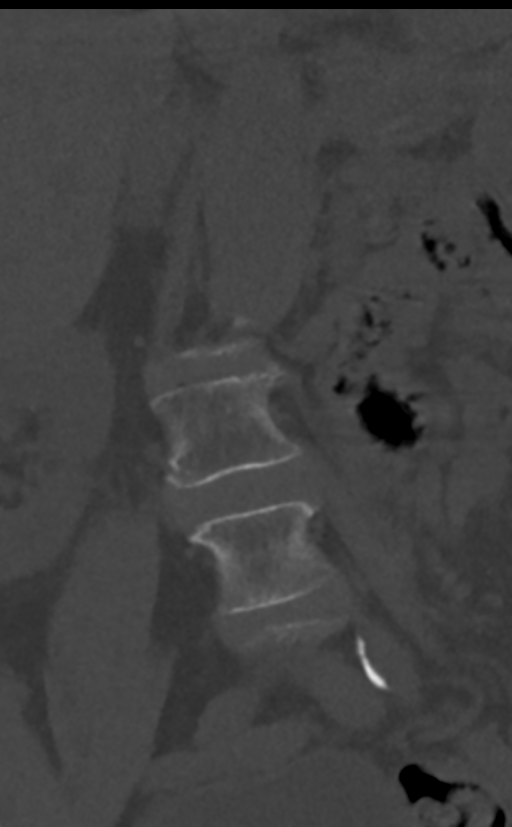
[im 29/72  bone]
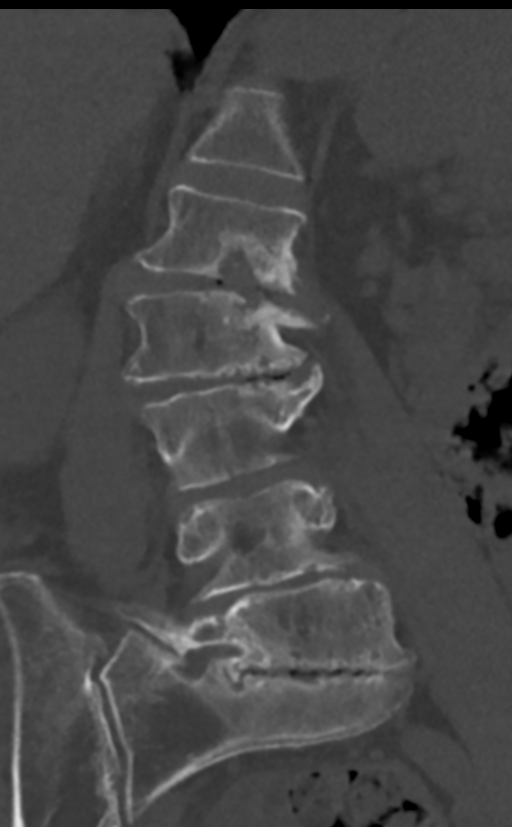
[im 43/72  bone]
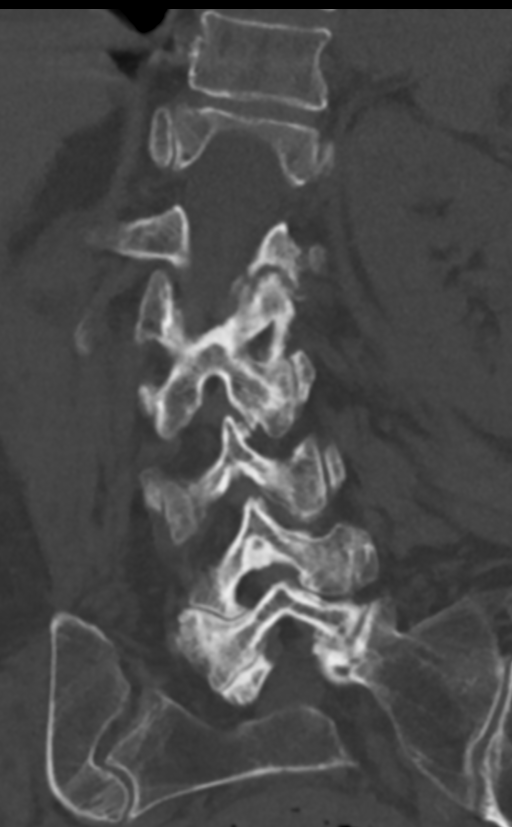

[Series 8: sagittal st · sagittal · 0.27mm/px · 5 of 65 slices shown, 6 images]
[im 22/65  bone]
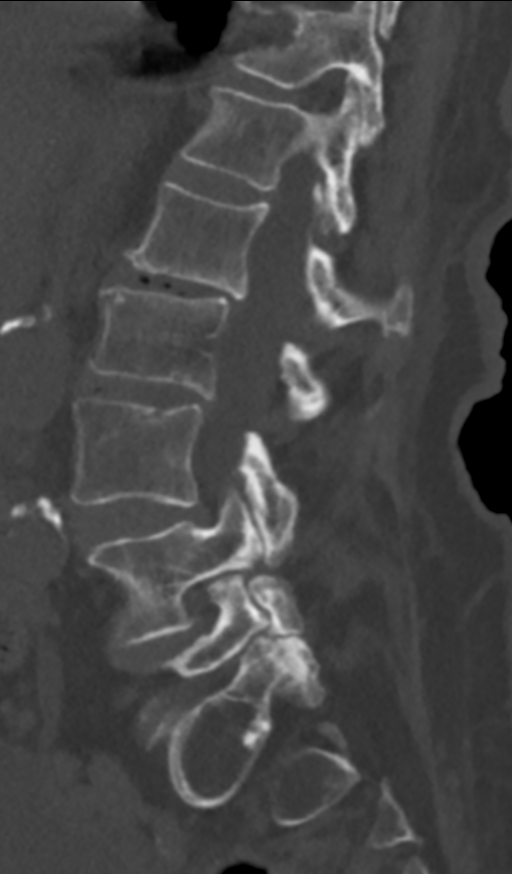
[im 27/65  bone]
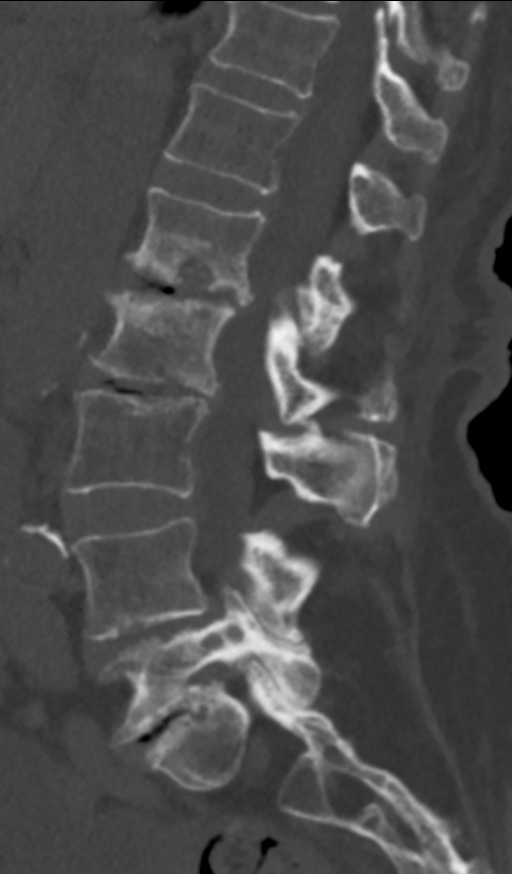
[im 33/65  soft-tissue]
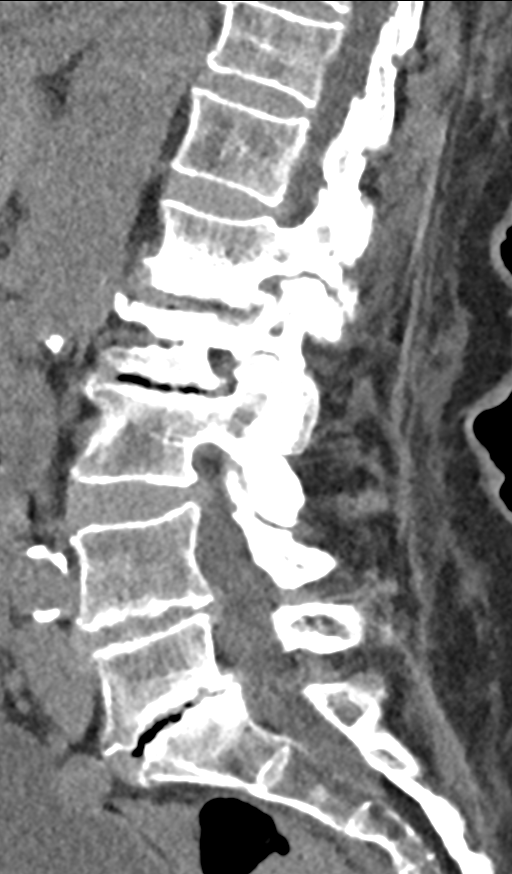
[im 33/65  bone]
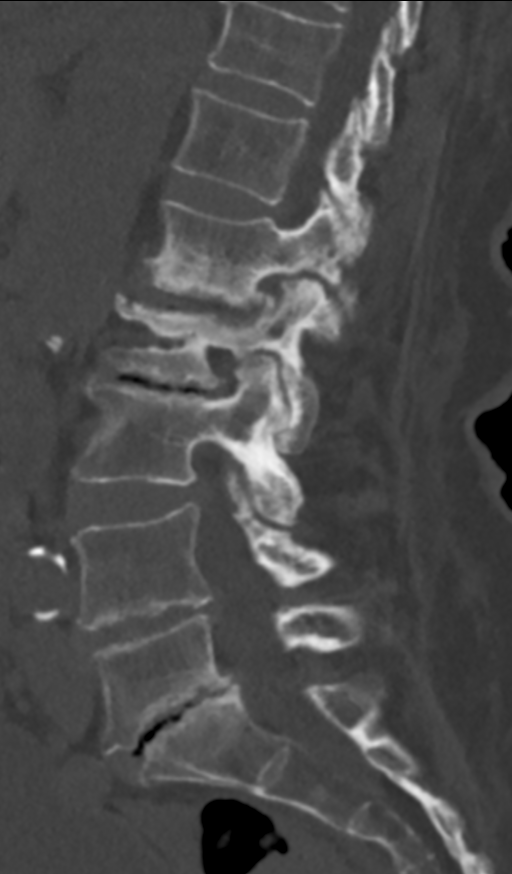
[im 38/65  bone]
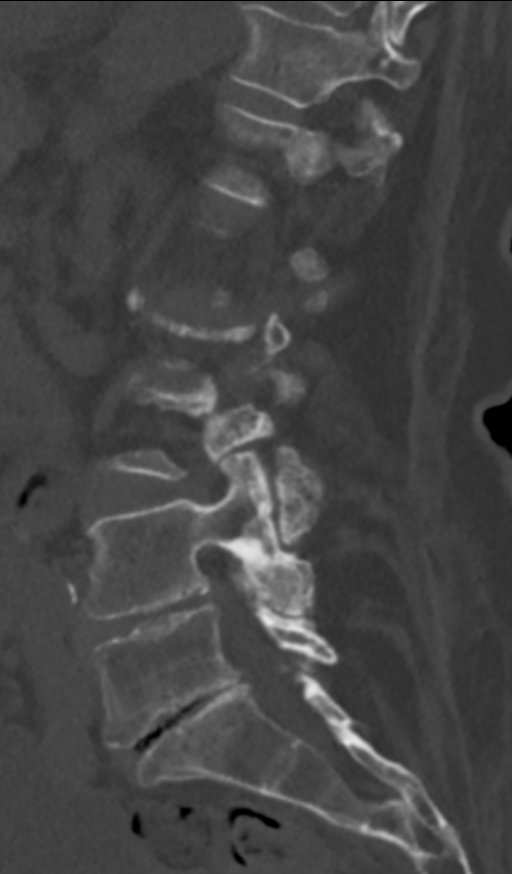
[im 43/65  bone]
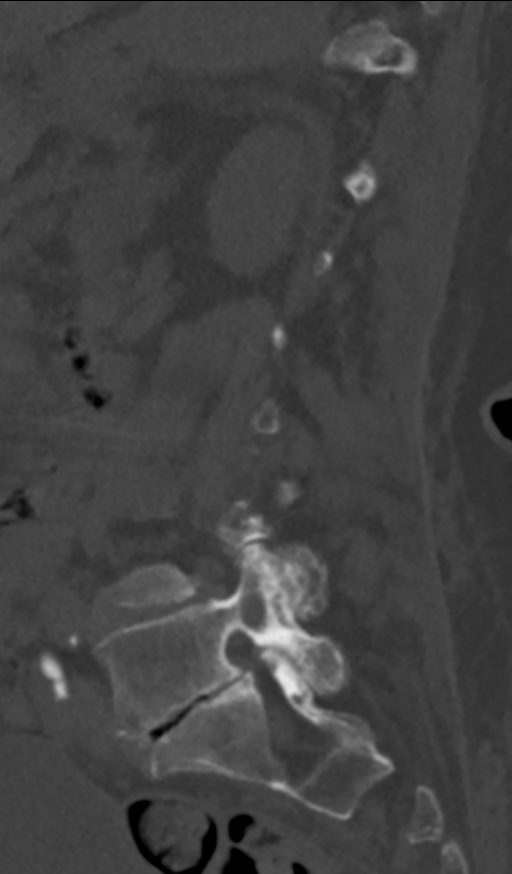

[11 of 33 positions shown; findings below may reference images not displayed]

FINDINGS: Segmentation: 5 lumbar type vertebrae.

Alignment: Right convex scoliosis with apex at L2

Vertebrae: Degenerative endplate remodeling at L1-2 and L5-S1.
Multilevel facet arthrosis. No acute fracture.

Paraspinal and other soft tissues: Calcific aortic atherosclerosis.

Disc levels: No spinal canal stenosis. There is severe left L1,
moderate left L2 and severe right L5 neural foraminal stenosis.
IMPRESSION: 1. No acute fracture or static subluxation of the lumbar spine.
2. Severe left L1, moderate left L2 and severe right L5 neural
foraminal stenosis.

Aortic Atherosclerosis ([id number]).
